# Patient Record
Sex: Female | Born: 1970 | Race: Black or African American | Hispanic: No | Marital: Single | Smoking: Never smoker
Health system: Southern US, Community
[De-identification: ages and names within clinical notes are randomized; demographics above are authoritative.]

## PROBLEM LIST (undated history)

## (undated) DIAGNOSIS — R7303 Prediabetes: Secondary | ICD-10-CM

## (undated) DIAGNOSIS — M1990 Unspecified osteoarthritis, unspecified site: Secondary | ICD-10-CM

## (undated) DIAGNOSIS — I10 Essential (primary) hypertension: Secondary | ICD-10-CM

## (undated) DIAGNOSIS — R519 Headache, unspecified: Secondary | ICD-10-CM

## (undated) DIAGNOSIS — F419 Anxiety disorder, unspecified: Secondary | ICD-10-CM

## (undated) DIAGNOSIS — E669 Obesity, unspecified: Secondary | ICD-10-CM

## (undated) HISTORY — PX: KNEE SURGERY: SHX244

## (undated) HISTORY — PX: SHOULDER SURGERY: SHX246

---

## 1994-12-07 HISTORY — PX: SHOULDER SURGERY: SHX246

## 1999-05-16 ENCOUNTER — Emergency Department (HOSPITAL_COMMUNITY): Admission: EM | Admit: 1999-05-16 | Discharge: 1999-05-16 | Payer: Self-pay | Admitting: Emergency Medicine

## 1999-07-29 ENCOUNTER — Other Ambulatory Visit: Admission: RE | Admit: 1999-07-29 | Discharge: 1999-07-29 | Payer: Self-pay | Admitting: Obstetrics & Gynecology

## 2000-08-11 ENCOUNTER — Other Ambulatory Visit: Admission: RE | Admit: 2000-08-11 | Discharge: 2000-08-11 | Payer: Self-pay | Admitting: Obstetrics & Gynecology

## 2000-11-09 ENCOUNTER — Emergency Department (HOSPITAL_COMMUNITY): Admission: EM | Admit: 2000-11-09 | Discharge: 2000-11-10 | Payer: Self-pay | Admitting: Emergency Medicine

## 2001-06-03 ENCOUNTER — Emergency Department (HOSPITAL_COMMUNITY): Admission: EM | Admit: 2001-06-03 | Discharge: 2001-06-04 | Payer: Self-pay | Admitting: Emergency Medicine

## 2001-06-03 ENCOUNTER — Encounter: Payer: Self-pay | Admitting: Emergency Medicine

## 2001-06-10 ENCOUNTER — Emergency Department (HOSPITAL_COMMUNITY): Admission: EM | Admit: 2001-06-10 | Discharge: 2001-06-10 | Payer: Self-pay | Admitting: Emergency Medicine

## 2001-08-16 ENCOUNTER — Other Ambulatory Visit: Admission: RE | Admit: 2001-08-16 | Discharge: 2001-08-16 | Payer: Self-pay | Admitting: Obstetrics and Gynecology

## 2001-11-14 ENCOUNTER — Encounter: Payer: Self-pay | Admitting: Obstetrics and Gynecology

## 2001-11-14 ENCOUNTER — Ambulatory Visit (HOSPITAL_COMMUNITY): Admission: RE | Admit: 2001-11-14 | Discharge: 2001-11-14 | Payer: Self-pay | Admitting: Obstetrics and Gynecology

## 2001-12-29 ENCOUNTER — Inpatient Hospital Stay (HOSPITAL_COMMUNITY): Admission: AD | Admit: 2001-12-29 | Discharge: 2001-12-29 | Payer: Self-pay | Admitting: Obstetrics and Gynecology

## 2002-01-14 ENCOUNTER — Inpatient Hospital Stay (HOSPITAL_COMMUNITY): Admission: AD | Admit: 2002-01-14 | Discharge: 2002-01-14 | Payer: Self-pay | Admitting: Obstetrics and Gynecology

## 2002-04-08 ENCOUNTER — Encounter (INDEPENDENT_AMBULATORY_CARE_PROVIDER_SITE_OTHER): Payer: Self-pay | Admitting: Specialist

## 2002-04-08 ENCOUNTER — Inpatient Hospital Stay (HOSPITAL_COMMUNITY): Admission: AD | Admit: 2002-04-08 | Discharge: 2002-04-10 | Payer: Self-pay | Admitting: Obstetrics and Gynecology

## 2002-09-05 ENCOUNTER — Other Ambulatory Visit: Admission: RE | Admit: 2002-09-05 | Discharge: 2002-09-05 | Payer: Self-pay | Admitting: Obstetrics and Gynecology

## 2003-08-23 ENCOUNTER — Other Ambulatory Visit: Admission: RE | Admit: 2003-08-23 | Discharge: 2003-08-23 | Payer: Self-pay | Admitting: *Deleted

## 2003-09-18 ENCOUNTER — Encounter: Admission: RE | Admit: 2003-09-18 | Discharge: 2003-12-17 | Payer: Self-pay | Admitting: *Deleted

## 2004-02-29 ENCOUNTER — Emergency Department (HOSPITAL_COMMUNITY): Admission: EM | Admit: 2004-02-29 | Discharge: 2004-02-29 | Payer: Self-pay | Admitting: Family Medicine

## 2004-03-30 ENCOUNTER — Emergency Department (HOSPITAL_COMMUNITY): Admission: EM | Admit: 2004-03-30 | Discharge: 2004-03-30 | Payer: Self-pay | Admitting: Emergency Medicine

## 2004-08-04 ENCOUNTER — Encounter: Admission: RE | Admit: 2004-08-04 | Discharge: 2004-09-05 | Payer: Self-pay | Admitting: Sports Medicine

## 2004-08-25 ENCOUNTER — Other Ambulatory Visit: Admission: RE | Admit: 2004-08-25 | Discharge: 2004-08-25 | Payer: Self-pay | Admitting: Family Medicine

## 2004-09-06 ENCOUNTER — Encounter: Admission: RE | Admit: 2004-09-06 | Discharge: 2004-11-04 | Payer: Self-pay | Admitting: Sports Medicine

## 2010-10-31 ENCOUNTER — Emergency Department (HOSPITAL_COMMUNITY): Admission: EM | Admit: 2010-10-31 | Discharge: 2010-10-31 | Payer: Self-pay | Admitting: Emergency Medicine

## 2011-02-17 LAB — DIFFERENTIAL
Basophils Absolute: 0 10*3/uL (ref 0.0–0.1)
Basophils Relative: 0 % (ref 0–1)
Eosinophils Absolute: 0.1 10*3/uL (ref 0.0–0.7)
Eosinophils Relative: 2 % (ref 0–5)
Lymphocytes Relative: 35 % (ref 12–46)
Lymphs Abs: 2.2 10*3/uL (ref 0.7–4.0)
Monocytes Absolute: 0.6 10*3/uL (ref 0.1–1.0)
Monocytes Relative: 9 % (ref 3–12)
Neutro Abs: 3.4 10*3/uL (ref 1.7–7.7)
Neutrophils Relative %: 53 % (ref 43–77)

## 2011-02-17 LAB — CBC
HCT: 43.8 % (ref 36.0–46.0)
Hemoglobin: 14.4 g/dL (ref 12.0–15.0)
MCH: 28.6 pg (ref 26.0–34.0)
MCHC: 32.9 g/dL (ref 30.0–36.0)
MCV: 87.1 fL (ref 78.0–100.0)
Platelets: 373 10*3/uL (ref 150–400)
RBC: 5.03 MIL/uL (ref 3.87–5.11)
RDW: 13.9 % (ref 11.5–15.5)
WBC: 6.4 10*3/uL (ref 4.0–10.5)

## 2011-02-17 LAB — POCT I-STAT, CHEM 8
BUN: 11 mg/dL (ref 6–23)
Calcium, Ion: 1.16 mmol/L (ref 1.12–1.32)
Chloride: 103 mEq/L (ref 96–112)
Creatinine, Ser: 0.9 mg/dL (ref 0.4–1.2)
Glucose, Bld: 96 mg/dL (ref 70–99)
HCT: 46 % (ref 36.0–46.0)
Hemoglobin: 15.6 g/dL — ABNORMAL HIGH (ref 12.0–15.0)
Potassium: 4.1 mEq/L (ref 3.5–5.1)
Sodium: 140 mEq/L (ref 135–145)
TCO2: 29 mmol/L (ref 0–100)
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
## 2011-11-16 ENCOUNTER — Emergency Department (HOSPITAL_COMMUNITY)
Admission: EM | Admit: 2011-11-16 | Discharge: 2011-11-16 | Disposition: A | Discharge status: Home / Self Care | Payer: Self-pay | Source: Home / Self Care

## 2011-11-16 ENCOUNTER — Emergency Department (HOSPITAL_COMMUNITY): Payer: Self-pay

## 2011-11-16 ENCOUNTER — Encounter: Payer: Self-pay | Admitting: Emergency Medicine

## 2011-11-16 ENCOUNTER — Emergency Department (HOSPITAL_COMMUNITY)
Admission: EM | Admit: 2011-11-16 | Discharge: 2011-11-16 | Disposition: A | Discharge status: Home / Self Care | Payer: Self-pay | Attending: Emergency Medicine | Admitting: Emergency Medicine

## 2011-11-16 DIAGNOSIS — R059 Cough, unspecified: Secondary | ICD-10-CM | POA: Insufficient documentation

## 2011-11-16 DIAGNOSIS — R05 Cough: Secondary | ICD-10-CM | POA: Insufficient documentation

## 2011-11-16 DIAGNOSIS — IMO0002 Reserved for concepts with insufficient information to code with codable children: Secondary | ICD-10-CM | POA: Insufficient documentation

## 2011-11-16 DIAGNOSIS — M7989 Other specified soft tissue disorders: Secondary | ICD-10-CM | POA: Insufficient documentation

## 2011-11-16 DIAGNOSIS — R609 Edema, unspecified: Secondary | ICD-10-CM | POA: Insufficient documentation

## 2011-11-16 DIAGNOSIS — M1710 Unilateral primary osteoarthritis, unspecified knee: Secondary | ICD-10-CM

## 2011-11-16 LAB — CBC
Hemoglobin: 12.2 g/dL (ref 12.0–15.0)
MCH: 28 pg (ref 26.0–34.0)
Platelets: 324 10*3/uL (ref 150–400)
RBC: 4.35 MIL/uL (ref 3.87–5.11)
WBC: 7.6 10*3/uL (ref 4.0–10.5)

## 2011-11-16 LAB — BASIC METABOLIC PANEL
CO2: 27 mEq/L (ref 19–32)
Calcium: 9.5 mg/dL (ref 8.4–10.5)
Chloride: 102 mEq/L (ref 96–112)
Glucose, Bld: 100 mg/dL — ABNORMAL HIGH (ref 70–99)
Potassium: 4.1 mEq/L (ref 3.5–5.1)
Sodium: 137 mEq/L (ref 135–145)

## 2011-11-16 MED ORDER — OXYCODONE-ACETAMINOPHEN 5-325 MG PO TABS
2.0000 | ORAL_TABLET | Freq: Once | ORAL | Status: AC
  Administered 2011-11-16: 2 via ORAL
  Filled 2011-11-16: qty 2

## 2011-11-16 MED ORDER — DICLOFENAC SODIUM 75 MG PO TBEC: 75.0000 mg | DELAYED_RELEASE_TABLET | Freq: Two times a day (BID) | ORAL | Status: DC

## 2011-11-16 NOTE — ED Notes (Signed)
Pt left [facility] and has now arrived at [facility]. Pt did not sign our at [facility].

## 2011-11-16 NOTE — ED Notes (Signed)
Pt c/o L leg swelling and ankle and R ankle swelling and  Headache x 2 weeks. Pt states has propped up legs and no relief.

## 2011-11-16 NOTE — ED Provider Notes (Signed)
History     CSN: [id number] Arrival date & time: 11/16/2011  1:12 PM   First MD Initiated Contact with Patient 11/16/11 1455      Chief Complaint  Patient presents with  . Leg Swelling    (Consider location/radiation/quality/duration/timing/severity/associated sxs/prior treatment) The history is provided by the patient.    Ptp resents to the ED with complaints of left knee swelling and pain and ankle pain. Also, complaints of headaches for two weeks, bilateral temple. Pt has gained a significant amount of weight recently, and has been trying to cut back on her salt intake. She denies recent illness, fevers, chills, SOB, CP, abdominal pain and urinary symptoms. She denies hx of diabetes. 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  History reviewed. No pertinent past medical history.  Past Surgical History  Procedure Date  . Cesarean section   . Shoulder surgery     History reviewed. No pertinent family history.  History  Substance Use Topics  . Smoking status: Not on file  . Smokeless tobacco: Not on file  . Alcohol Use: No    OB History    Grav Para Term Preterm Abortions TAB SAB Ect Mult Living                  Review of Systems  All other systems reviewed and are negative.    Allergies  Codeine  Home Medications  No current outpatient prescriptions on file.  BP 132/75  Pulse 89  Temp(Src) 98.4 F (36.9 C) (Oral)  Resp 16  SpO2 98%  LMP 10/27/2011  Physical Exam  Nursing note and vitals reviewed. Constitutional: She appears well-developed and well-nourished.       Pt is morbidly obese.  HENT:  Head: Normocephalic and atraumatic.  Eyes: Conjunctivae are normal. Pupils are equal, round, and reactive to light.  Neck: Trachea normal, normal range of motion and full passive range of motion without pain. Neck supple.  Cardiovascular: Normal rate, regular rhythm and normal pulses.   Pulmonary/Chest: Effort normal and  breath sounds normal. Chest wall is not dull to percussion. She exhibits no tenderness, no crepitus, no edema, no deformity and no retraction.  Abdominal: Normal appearance.  Musculoskeletal: Normal range of motion. She exhibits edema (mild swelling to bilateral lower extremities. ).       Legs: Neurological: She is alert. She has normal strength.  Skin: Skin is warm, dry and intact.  Psychiatric: She has a normal mood and affect. Her speech is normal and behavior is normal. Judgment and thought content normal. Cognition and memory are normal.    ED Course  Procedures (including critical care time)  Labs Reviewed  BASIC METABOLIC PANEL - Abnormal; Notable for the following:    Glucose, Bld 100 (*)    GFR calc non Af Amer 85 (*)    All other components within normal limits  CBC   Dg Chest 2 View  11/16/2011  *RADIOLOGY REPORT*  Clinical Data: Bilateral lower extremity edema, cough  CHEST - 2 VIEW  Comparison: None.  Findings: Cardiomediastinal silhouette is unremarkable.  No acute infiltrate or pleural effusion.  No pulmonary edema.  Bony thorax is unremarkable.  IMPRESSION: No active disease.  Original Report Authenticated By: Natasha Mead, M.D.   Dg Knee Complete 4 Views Left  11/16/2011  *RADIOLOGY REPORT*  Clinical Data: Knee pain and swelling.  LEFT KNEE - COMPLETE 4+ VIEW  Comparison: None  Findings: There are age  advanced degenerative changes with joint space narrowing and osteophytic spurring most notable in the medial compartment.  No definite acute fracture or osteochondral lesion. A suprapatellar knee joint effusion is noted.  IMPRESSION:  1.  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 2.  No definite acute fracture. 3.  Joint effusion.  Original Report Authenticated By: P. Loralie Champagne, M.D.     No diagnosis found.    MDM  pts labs show unlikely CHF causing lower extremity swelling. Kidney function appears to be adaquate and pt is making urine. Knee xrays show a  nonspecific joint effusion with some age advanced osteoarthritis.        Dorthula Matas, PA 11/16/11 [phone]

## 2011-11-16 NOTE — ED Notes (Signed)
Patient transported to X-ray 

## 2011-11-17 NOTE — ED Provider Notes (Signed)
Medical screening examination/treatment/procedure(s) were performed by non-physician practitioner and as supervising physician I was immediately available for consultation/collaboration.   Gerhard Munch, MD 11/17/11 1213

## 2012-03-06 ENCOUNTER — Encounter (HOSPITAL_COMMUNITY): Payer: Self-pay | Admitting: *Deleted

## 2012-03-06 ENCOUNTER — Emergency Department (INDEPENDENT_AMBULATORY_CARE_PROVIDER_SITE_OTHER)
Admission: EM | Admit: 2012-03-06 | Discharge: 2012-03-06 | Disposition: A | Discharge status: Home / Self Care | Payer: Self-pay | Source: Home / Self Care

## 2012-03-06 DIAGNOSIS — B86 Scabies: Secondary | ICD-10-CM

## 2012-03-06 MED ORDER — PERMETHRIN 5 % EX CREA: TOPICAL_CREAM | Freq: Once | CUTANEOUS | Status: AC

## 2012-03-06 NOTE — ED Provider Notes (Signed)
Medical screening examination/treatment/procedure(s) were performed by PGY-3 FM resident and as supervising physician I was immediately available for consultation/collaboration.   Sharin Grave, MD   Sharin Grave, MD 03/06/12 1478

## 2012-03-06 NOTE — ED Notes (Signed)
Pt exposed to scabies - onset of itching x 2 weeks

## 2012-03-06 NOTE — ED Provider Notes (Signed)
Morgan Cline is a 41 y.o. female who presents to Urgent Care today for scabies exposure and itching.  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  She is anxious that she may have contracted scabies and is itchy.  She notes several itchy spots on her back and arm and in her groin.  She is not taking any medications for this yet. Otherwise she feels well that any pain fever chills trouble breathing.   PMH reviewed. Significant for obesity otherwise well ROS as above otherwise neg Medications reviewed. No current facility-administered medications for this encounter.   Current Outpatient Prescriptions  Medication Sig Dispense Refill  . diclofenac (VOLTAREN) 75 MG EC tablet Take 1 tablet (75 mg total) by mouth 2 (two) times daily.  30 tablet  0  . permethrin (ELIMITE) 5 % cream Apply topically once.  60 g  0    Exam:  BP 108/79  Pulse 123  Temp(Src) 97.5 F (36.4 C) (Oral)  Resp 20  SpO2 100%  LMP 02/19/2012 Gen: Well NAD, obese Skin: Small excoriated spots on arm back and in groin some occasional papules with erythematous base.  No significant interdigital web space involvement.  Assessment and Plan: 41 year old woman with possible scabies.  She has certainly been exposed and it does make sense to attempt treatment with permethrin cream.  Additionally recommended hydrocortisone cream and Benadryl as needed for symptoms.  I advised that it may be several weeks before she stops itching.  Discussed warning signs or symptoms. Additionally I provided a handout. She expresses understanding.     Rodolph Bong, MD 03/06/12 1020

## 2012-03-06 NOTE — Discharge Instructions (Signed)
Thank you for coming in today. Apply the permethrin from the neck down overnight and wash off in the morning. Use hydrocortisone cream and Benadryl as needed for itching. It may take a few weeks to stop itching. Followup with your doctor if you do not get better in a few weeks. Call or go to the emergency room if you get worse, have trouble breathing, have chest pains, or palpitations.   Scabies Scabies are small bugs (mites) that burrow under the skin and cause red bumps and severe itching. These bugs can only be seen with a microscope. Scabies are highly contagious. They can spread easily from person to person by direct contact. They are also spread through sharing clothing or linens that have the scabies mites living in them. It is not unusual for an entire family to become infected through shared towels, clothing, or bedding.  HOME CARE INSTRUCTIONS   Your caregiver may prescribe a cream or lotion to kill the mites. If this cream is prescribed; massage the cream into the entire area of the body from the neck to the bottom of both feet. Also massage the cream into the scalp and face if your child is less than 16 year old. Avoid the eyes and mouth.   Leave the cream on for 8 to12 hours. Do not wash your hands after application. Your child should bathe or shower after the 8 to 12 hour application period. Sometimes it is helpful to apply the cream to your child at right before bedtime.   One treatment is usually effective and will eliminate approximately 95% of infestations. For severe cases, your caregiver may decide to repeat the treatment in 1 week. Everyone in your household should be treated with one application of the cream.   New rashes or burrows should not appear after successful treatment within 24 to 48 hours; however the itching and rash may last for 2 to 4 weeks after successful treatment. If your symptoms persist longer than this, see your caregiver.   Your caregiver also may  prescribe a medication to help with the itching or to help the rash go away more quickly.   Scabies can live on clothing or linens for up to 3 days. Your entire child's recently used clothing, towels, stuffed toys, and bed linens should be washed in hot water and then dried in a dryer for at least 20 minutes on high heat. Items that cannot be washed should be enclosed in a plastic bag for at least 3 days.   To help relieve itching, bathe your child in a cool bath or apply cool washcloths to the affected areas.   Your child may return to school after treatment with the prescribed cream.  SEEK MEDICAL CARE IF:   The itching persists longer than 4 weeks after treatment.   The rash spreads or becomes infected (the area has red blisters or yellow-tan crust).  Document Released: 11/23/2005 Document Revised: 11/12/2011 Document Reviewed: 04/03/2009 [facility] Patient Information 2012 [place], [place].

## 2012-03-18 ENCOUNTER — Inpatient Hospital Stay (HOSPITAL_COMMUNITY)
Admission: AD | Admit: 2012-03-18 | Discharge: 2012-03-19 | Disposition: A | Discharge status: Home / Self Care | Payer: Self-pay | Source: Ambulatory Visit | Attending: Obstetrics & Gynecology | Admitting: Obstetrics & Gynecology

## 2012-03-18 ENCOUNTER — Encounter (HOSPITAL_COMMUNITY): Payer: Self-pay | Admitting: Obstetrics and Gynecology

## 2012-03-18 DIAGNOSIS — L03319 Cellulitis of trunk, unspecified: Secondary | ICD-10-CM | POA: Insufficient documentation

## 2012-03-18 DIAGNOSIS — N949 Unspecified condition associated with female genital organs and menstrual cycle: Secondary | ICD-10-CM | POA: Insufficient documentation

## 2012-03-18 DIAGNOSIS — L02219 Cutaneous abscess of trunk, unspecified: Secondary | ICD-10-CM | POA: Insufficient documentation

## 2012-03-18 DIAGNOSIS — L02215 Cutaneous abscess of perineum: Secondary | ICD-10-CM

## 2012-03-18 HISTORY — DX: Obesity, unspecified: E66.9

## 2012-03-18 HISTORY — DX: Unspecified osteoarthritis, unspecified site: M19.90

## 2012-03-18 MED ORDER — HYDROCODONE-ACETAMINOPHEN 5-500 MG PO TABS: 1.0000 | ORAL_TABLET | Freq: Four times a day (QID) | ORAL | Status: AC | PRN

## 2012-03-18 MED ORDER — CEPHALEXIN 500 MG PO CAPS: 500.0000 mg | ORAL_CAPSULE | Freq: Four times a day (QID) | ORAL | Status: AC

## 2012-03-18 NOTE — MAU Note (Signed)
"  I was trying to go to the BR too fast this past Saturday 03/12/12 and I hit the side of the toilet.  I put ice on it and it seemed to have gotten worse.  It started out about the size of the tip of my pinky finger, but now it is bigger about the size of a half dollar."

## 2012-03-18 NOTE — MAU Note (Signed)
Pt states, " last week I was trying to get to the bathroom quick and I hit the edge of the tolite seat. I area that I hit has gotten larger and larger. It hurts to walk or sit."

## 2012-03-18 NOTE — MAU Provider Note (Signed)
History   Pt presents today c/o pain in her vagina. She states she hit her vagina on the toilet seat last Saturday and since that time, she has developed swelling in the area and it has now become very painful. She states she has difficult even sitting. She denies fever, vag irritation, or any other sx at this time.  CSN: [id number]  Arrival date and time: 03/18/12 2222   First Provider Initiated Contact with Patient 03/18/12 2258      Chief Complaint  Patient presents with  . Vaginal Pain   HPI  OB History    Grav Para Term Preterm Abortions TAB SAB Ect Mult Living   2 2 0 2      1      Past Medical History  Diagnosis Date  . Arthritis   . Obesity     Past Surgical History  Procedure Date  . Cesarean section   . Shoulder surgery     History reviewed. No pertinent family history.  History  Substance Use Topics  . Smoking status: Never Smoker   . Smokeless tobacco: Not on file  . Alcohol Use: No    Allergies:  Allergies  Allergen Reactions  . Codeine Hives and Itching    Prescriptions prior to admission  Medication Sig Dispense Refill  . acetaminophen (TYLENOL) 500 MG tablet Take 500 mg by mouth every 6 (six) hours as needed. As needed for pain        Review of Systems  Constitutional: Negative for fever and chills.  Eyes: Negative for blurred vision and double vision.  Respiratory: Negative for cough, hemoptysis, sputum production, shortness of breath and wheezing.   Cardiovascular: Negative for chest pain and palpitations.  Gastrointestinal: Negative for nausea, vomiting, abdominal pain, diarrhea and constipation.  Genitourinary: Negative for dysuria, urgency, frequency and hematuria.  Neurological: Negative for dizziness and headaches.  Psychiatric/Behavioral: Negative for depression and suicidal ideas.   Physical Exam   Blood pressure 118/81, pulse 100, temperature 98.8 F (37.1 C), temperature source Oral, resp. rate 18, height 5' 3.5" (1.613 m),  weight 280 lb 8 oz (127.234 kg), last menstrual period 02/19/2012.  Physical Exam  Nursing note and vitals reviewed. Constitutional: She is oriented to person, place, and time. She appears well-developed and well-nourished. No distress.  HENT:  Head: Normocephalic and atraumatic.  GI: Soft. She exhibits no distension and no mass. There is no tenderness. There is no rebound and no guarding.  Genitourinary:     Neurological: She is alert and oriented to person, place, and time.  Skin: Skin is warm and dry. She is not diaphoretic.  Psychiatric: She has a normal mood and affect. Her behavior is normal. Judgment and thought content normal.    MAU Course  INCISION AND DRAINAGE Date/Time: 03/18/2012 11:41 PM Performed by: Harmon Pier Authorized by: Jaynie Collins A Consent: Verbal consent obtained. Written consent obtained. Risks and benefits: risks, benefits and alternatives were discussed Consent given by: patient Patient understanding: patient states understanding of the procedure being performed Patient consent: the patient's understanding of the procedure matches consent given Patient identity confirmed: verbally with patient and arm band Time out: Immediately prior to procedure a "time out" was called to verify the correct patient, procedure, equipment, support staff and site/side marked as required. Type: abscess Body area: anogenital Location details: perineum Anesthesia: local infiltration Local anesthetic: lidocaine 1% without epinephrine Anesthetic total: 3 ml Patient sedated: no Scalpel size: 11 Needle gauge: 22 Incision type: single straight  Complexity: simple Drainage: purulent Drainage amount: moderate Wound treatment: wound left open Packing material: none Patient tolerance: Patient tolerated the procedure well with no immediate complications. Comments: Pt had immediate relief of symptoms with drainage.      Assessment and Plan  Perineal abscess: I&D  successful. Will give pt Rx for keflex and vicodin. Pt states she has used vicodin in the past without itching or other sx. She will f/u with her PCP. Discussed diet, activity, risks, and precautions. Discussed wound care instructions and precautions.  Clinton Gallant. Garin Mata III, DrHSc, MPAS, PA-C  03/18/2012, 11:36 PM

## 2012-03-18 NOTE — Discharge Instructions (Signed)
Perineal Abscess An abscess is a sac filled infection. The perineum is the area between the anus and the scrotum in the female and between the anus and the vulva (the labial opening to the vagina) in the female. The size of the abscess varies. If it remains open and begins draining, it can form a fistula. A fistula is a tract that drains the abscess to the surface. The peak incidence of this problem is in the third to fourth decades of life. Men are affected more frequently than women.  CAUSES  There are numerous causes for this type of infection. In women  The spread of cancer from pelvic organs such as the uterus and ovaries or the spread of infection from special glands located near the vagina.   Problems that occur at the time or soon after delivering a baby:   Infected lochia (the fluid that weeps from the vagina for a week or so after delivery of a baby).   Stool contaminating an episiotomy (a surgical procedure for widening the outlet of the birth canal to facilitate delivery of the baby and to avoid a jagged rip of the perineum).   Poor hygiene could all contribute to forming an abscess of the perineum.  In men  Infection of the prostate gland or the spread of cancer from the prostate.  In men and women  Complication of surgery to the rectum.   Diseases of the colon (such as Crohn's disease).   The spread of cancer from the rectum.   It is also more common when the immune system is depressed:   Patients receiving chemotherapy.   Patients with AIDS.  SYMPTOMS  Pain, swelling, and redness in the area of the abscess are the most frequent symptoms. The redness may go beyond the abscess and appear as a red streak on the skin. There is often a visible lump or a lump that can be felt when touching the area. Bleeding, pus-like discharge, fevers, and general weakness may also be present.  DIAGNOSIS  An examination by your caregiver will establish this ailment. A digital rectal exam,  and in women, a careful vaginal exam, is sometimes necessary. Sometimes looking into your rectum with a special instrument is needed.  TREATMENT  Incision and drainage is one common treatment of an abscess. This can sometimes be done in an office, emergency department, or surgical center with a local anesthetic. For larger or deeper abscesses, an operation is required to drain the abscess. Antibiotics are sometimes used if there is cellulitis (infection of the surrounding tissue). Antibiotics are medications that kill bacteria germs. Antibiotics may also be used if there are immune problems or heart valve problems.  Packing of the wound is sometimes done to produce good drainage. Frequent sitz baths may be recommended to help the wound heal in from the base and sides so that the risk of continued infection and recurrence is reduced. HOME CARE INSTRUCTIONS   If a gauze pack is used in the abscess, it can usually be removed in 2 to 3 days or as directed.   If one or more drains have been placed in the abscess cavity, be careful not to pull at them. Your caregiver will tell you how long they need to remain in place.   Use warm sitz baths 3 to 4 times per day and after bowel movements.   Keep the skin around the abscess clean and dry. Avoid cleaning the area too much or scratching.   Avoid using   colored or perfumed toilet papers.   Use pain medication, antibiotics, or other medications that your caregiver tells you to use.  SEEK MEDICAL CARE IF:   You have problems having a bowel movement or passing your urine.   The gauze packing or the drain(s) come out before the planned time.   Swelling or pain in the affected area does not seem to be improving as expected.   You feel you have side effects from prescribed medication.  SEEK IMMEDIATE MEDICAL CARE IF:   You develop problems moving or using your legs.   You develop severe or increasing pain.   Swelling of the affected area suddenly gets  worse.   You have increased bleeding or passing of pus.   You develop chills or fever above 101 F (38.3 C).  MAKE SURE YOU:   Understand these instructions.   Will watch your condition.   Will get help right away if you are not doing well or get worse.  Document Released: 12/30/2006 Document Revised: 11/12/2011 Document Reviewed: 01/30/2008 [employer] Patient Information 2012 [employer]. 

## 2012-03-22 NOTE — MAU Provider Note (Signed)
Attestation of Attending Supervision of Advanced Practitioner: Evaluation and management procedures were performed by the [facility] Fellow/PA/CNM/NP under my supervision and collaboration. Chart reviewed, and agree with management and plan.  Jaynie Collins, M.D. 03/22/2012 11:36 AM

## 2013-06-30 DIAGNOSIS — N939 Abnormal uterine and vaginal bleeding, unspecified: Secondary | ICD-10-CM | POA: Insufficient documentation

## 2014-10-08 ENCOUNTER — Encounter (HOSPITAL_COMMUNITY): Payer: Self-pay | Admitting: Obstetrics and Gynecology

## 2014-11-28 ENCOUNTER — Encounter (HOSPITAL_BASED_OUTPATIENT_CLINIC_OR_DEPARTMENT_OTHER): Payer: Self-pay | Admitting: Emergency Medicine

## 2014-11-28 ENCOUNTER — Emergency Department (HOSPITAL_BASED_OUTPATIENT_CLINIC_OR_DEPARTMENT_OTHER)
Admission: EM | Admit: 2014-11-28 | Discharge: 2014-11-28 | Disposition: A | Discharge status: Home / Self Care | Payer: BC Managed Care – PPO | Attending: Emergency Medicine | Admitting: Emergency Medicine

## 2014-11-28 DIAGNOSIS — R197 Diarrhea, unspecified: Secondary | ICD-10-CM | POA: Diagnosis not present

## 2014-11-28 DIAGNOSIS — R109 Unspecified abdominal pain: Secondary | ICD-10-CM | POA: Insufficient documentation

## 2014-11-28 DIAGNOSIS — Z3202 Encounter for pregnancy test, result negative: Secondary | ICD-10-CM | POA: Diagnosis not present

## 2014-11-28 DIAGNOSIS — E669 Obesity, unspecified: Secondary | ICD-10-CM | POA: Diagnosis not present

## 2014-11-28 DIAGNOSIS — M1990 Unspecified osteoarthritis, unspecified site: Secondary | ICD-10-CM | POA: Diagnosis not present

## 2014-11-28 LAB — PREGNANCY, URINE: Preg Test, Ur: NEGATIVE

## 2014-11-28 LAB — URINALYSIS, ROUTINE W REFLEX MICROSCOPIC
BILIRUBIN URINE: NEGATIVE
Glucose, UA: NEGATIVE mg/dL
Hgb urine dipstick: NEGATIVE
KETONES UR: NEGATIVE mg/dL
Leukocytes, UA: NEGATIVE
NITRITE: NEGATIVE
PH: 7 (ref 5.0–8.0)
Protein, ur: NEGATIVE mg/dL
Specific Gravity, Urine: 1.018 (ref 1.005–1.030)
Urobilinogen, UA: 0.2 mg/dL (ref 0.0–1.0)

## 2014-11-28 MED ORDER — DICYCLOMINE HCL 20 MG PO TABS: 20.0000 mg | ORAL_TABLET | Freq: Two times a day (BID) | ORAL | Status: DC

## 2014-11-28 MED ORDER — ONDANSETRON HCL 4 MG PO TABS: 4.0000 mg | ORAL_TABLET | Freq: Four times a day (QID) | ORAL | Status: DC

## 2014-11-28 MED ORDER — DICYCLOMINE HCL 10 MG PO CAPS
10.0000 mg | ORAL_CAPSULE | Freq: Once | ORAL | Status: AC
  Administered 2014-11-28: 10 mg via ORAL
  Filled 2014-11-28: qty 1

## 2014-11-28 NOTE — Discharge Instructions (Signed)
Food Poisoning °Food poisoning is an illness caused by something you ate or drank. There are over 250 known causes of food poisoning. However, many other causes are unknown. You can be treated even if the exact cause of your food poisoning is not known. In most cases, food poisoning is mild and lasts 1 to 2 days. However, some cases can be serious, especially for people with low immune systems, the elderly, children and infants, and pregnant women. °CAUSES  °Poor personal hygiene, improper cleaning of storage and preparation areas, and unclean utensils can cause infection or tainting (contamination) of foods. The causes of food poisoning are numerous. Infectious agents, such as viruses, bacteria, or parasites, can cause harm by infecting the intestine and disrupting the absorption of nutrients and water. This can cause diarrhea and lead to dehydration. Viruses are responsible for most of the food poisonings in which an agent is found. Parasites are less likely to cause food poisoning. Toxic agents, such as poisonous mushrooms, marine algae, and pesticides can also cause food poisoning. °· Viral causes of food poisoning include: °¨ Norovirus. °¨ Rotavirus. °¨ Hepatitis A. °· Bacterial causes of food poisoning include: °¨ Salmonellae. °¨ Campylobacter. °¨ Bacillus cereus. °¨ Escherichia coli (E. coli). °¨ Shigella. °¨ Listeria monocytogenes. °¨ Clostridium botulinum (botulism). °¨ Vibrio cholerae. °· Parasites that can cause food poisoning include: °¨ Giardia. °¨ Cryptosporidium. °¨ Toxoplasma. °SYMPTOMS °Symptoms may appear several hours or longer after consuming the contaminated food or drink. Symptoms may include: °· Nausea. °· Vomiting. °· Cramping. °· Diarrhea. °· Fever and chills. °· Muscle aches. °DIAGNOSIS °Your health care provider may be able to diagnose food poisoning from a list of what you have recently eaten and results from lab tests. Diagnostic tests may include an exam of the feces. °TREATMENT °In  most cases, treatment focuses on helping to relieve your symptoms and staying well hydrated. Antibiotic medicines are rarely needed. In severe cases, hospitalization may be required. °HOME CARE INSTRUCTIONS  °· Drink enough water and fluids to keep your urine clear or pale yellow. Drink small amounts of fluids frequently and increase as tolerated. °· Ask your health care provider for specific rehydration instructions. °· Avoid: °¨ Foods high in sugar. °¨ Alcohol. °¨ Carbonated drinks. °¨ Tobacco. °¨ Juice. °¨ Caffeine drinks. °¨ Extremely hot or cold fluids. °¨ Fatty, greasy foods. °¨ Too much intake of anything at one time. °¨ Dairy products until 24 to 48 hours after diarrhea stops. °· You may consume probiotics. Probiotics are active cultures of beneficial bacteria. They may lessen the amount and number of diarrheal stools in adults. Probiotics can be found in yogurt with active cultures and in supplements. °· Wash your hands well to avoid spreading the bacteria. °· Take medicines only as directed by your health care provider. Do not give your child aspirin because of the association with Reye's syndrome. °· Ask your health care provider if you should continue to take your regular prescribed and over-the-counter medicines. °PREVENTION  °· Wash your hands, food preparation surfaces, and utensils thoroughly before and after handling raw foods. °· Keep refrigerated foods below 40°F (5°C). °· Serve hot foods immediately or keep them heated above 140°F (60°C). °· Divide large volumes of food into small portions for rapid cooling in the refrigerator. Hot, bulky foods in the refrigerator can raise the temperature of other foods that have already cooled. °· Follow approved canning procedures. °· Heat canned foods thoroughly before tasting. °· When in doubt, throw it out. °· Infants, the elderly, women   who are pregnant, and people with compromised immune systems are especially susceptible to food poisoning. These people  should never consume unpasteurized cheese, unpasteurized cider, raw fish, raw seafood, or raw meat-type products. °SEEK IMMEDIATE MEDICAL CARE IF:  °· You have difficulty breathing, swallowing, talking, or moving. °· You develop blurred vision. °· You are unable to keep fluids down. °· You faint or nearly faint. °· Your eyes turn yellow. °· Vomiting or diarrhea develops or becomes persistent. °· Abdominal pain develops, increases, or localizes in one small area. °· You have a fever. °· The diarrhea becomes excessive or contains blood or mucus. °· You develop excessive weakness, dizziness, or extreme thirst. °· You have no urine for 8 hours. °MAKE SURE YOU:  °· Understand these instructions. °· Will watch your condition. °· Will get help right away if you are not doing well or get worse. °Document Released: 08/21/2004 Document Revised: 04/09/2014 Document Reviewed: 04/09/2011 °[employer]® Patient Information ©2015 [employer]. This information is not intended to replace advice given to you by your health care provider. Make sure you discuss any questions you have with your health care provider. ° °

## 2014-11-28 NOTE — ED Provider Notes (Signed)
CSN: [id number]     Arrival date & time 11/28/14  0714 History   First MD Initiated Contact with Patient 11/28/14 0757     Chief Complaint  Patient presents with  . Abdominal Pain     (Consider location/radiation/quality/duration/timing/severity/associated sxs/prior Treatment) HPI Comments: Patient reports eating chicken wings and pizza last night in about 2 hours later began having low intermittent abdominal cramping, and has had about 6 episodes of loose stool that has been nonbloody.  She has had chills but no fevers.  No nausea, no vomiting, no dysuria.  She has no pain currently  Patient is a 43 y.o. female presenting with abdominal pain.  Abdominal Pain Associated symptoms: diarrhea   Associated symptoms: no chest pain, no chills, no cough, no dysuria, no fatigue, no fever, no nausea, no shortness of breath, no sore throat and no vomiting     Past Medical History  Diagnosis Date  . Arthritis   . Obesity    Past Surgical History  Procedure Laterality Date  . Cesarean section    . Shoulder surgery     History reviewed. No pertinent family history. History  Substance Use Topics  . Smoking status: Never Smoker   . Smokeless tobacco: Not on file  . Alcohol Use: No   OB History    Gravida Para Term Preterm AB TAB SAB Ectopic Multiple Living   2 2 0 2      1     Review of Systems  Constitutional: Negative for fever, chills, diaphoresis, activity change, appetite change and fatigue.  HENT: Negative for congestion, facial swelling, rhinorrhea and sore throat.   Eyes: Negative for photophobia and discharge.  Respiratory: Negative for cough, chest tightness and shortness of breath.   Cardiovascular: Negative for chest pain, palpitations and leg swelling.  Gastrointestinal: Positive for abdominal pain and diarrhea. Negative for nausea and vomiting.  Endocrine: Negative for polydipsia and polyuria.  Genitourinary: Negative for dysuria, frequency, difficulty urinating and  pelvic pain.  Musculoskeletal: Negative for back pain, arthralgias, neck pain and neck stiffness.  Skin: Negative for color change and wound.  Allergic/Immunologic: Negative for immunocompromised state.  Neurological: Negative for facial asymmetry, weakness, numbness and headaches.  Hematological: Does not bruise/bleed easily.  Psychiatric/Behavioral: Negative for confusion and agitation.      Allergies  Codeine  Home Medications   Prior to Admission medications   Medication Sig Start Date End Date Taking? Authorizing Provider  acetaminophen (TYLENOL) 500 MG tablet Take 500 mg by mouth every 6 (six) hours as needed. As needed for pain    Historical Provider, MD  dicyclomine (BENTYL) 20 MG tablet Take 1 tablet (20 mg total) by mouth 2 (two) times daily. 11/28/14   Ernestina Patches, MD  ondansetron (ZOFRAN) 4 MG tablet Take 1 tablet (4 mg total) by mouth every 6 (six) hours. 11/28/14   Ernestina Patches, MD   BP 108/79 mmHg  Pulse 74  Temp(Src) 98.3 F (36.8 C) (Oral)  Resp 16  Wt 280 lb (127.007 kg)  SpO2 99%  LMP 10/29/2014 (Approximate) Physical Exam  Constitutional: She is oriented to person, place, and time. She appears well-developed and well-nourished. No distress.  Obese  HENT:  Head: Normocephalic and atraumatic.  Mouth/Throat: No oropharyngeal exudate.  Eyes: Pupils are equal, round, and reactive to light.  Neck: Normal range of motion. Neck supple.  Cardiovascular: Normal rate, regular rhythm and normal heart sounds.  Exam reveals no gallop and no friction rub.   No murmur heard. Pulmonary/Chest:  Effort normal and breath sounds normal. No respiratory distress. She has no wheezes. She has no rales.  Abdominal: Soft. Bowel sounds are normal. She exhibits no distension and no mass. There is no tenderness. There is no rebound and no guarding.  Musculoskeletal: Normal range of motion. She exhibits no edema or tenderness.  Neurological: She is alert and oriented to person,  place, and time.  Skin: Skin is warm and dry.  Psychiatric: She has a normal mood and affect.    ED Course  Procedures (including critical care time) Labs Review Labs Reviewed  URINALYSIS, ROUTINE W REFLEX MICROSCOPIC  PREGNANCY, URINE    Imaging Review No results found.   EKG Interpretation None      MDM   Final diagnoses:  Abdominal cramping  Diarrhea    Pt is a 43 y.o. female with Pmhx as above who presents with intermittent low abdominal cramping and about 6 episodes of loose stool last night about 2 hours after eating pizza and hot wings.  She's had chills, no known fevers, no nausea or vomiting, no dysuria.  She has no pain currently and has been tolerating Gatorade and ginger ale.  On physical exam vital signs are stable and she is in no acute distress.  She has no abdominal tenderness reproducible on abdominal exam.  I suspect food borne illness.  She is tolerating by mouth, and has normal exam I do not feel lab work is indicated.  I doubt acute surgical cause pain.  Patient will be discharged home with prescription for Bentyl and Zofran     [facility] evaluation in the Emergency Department is complete. It has been determined that no acute conditions requiring further emergency intervention are present at this time. The patient/guardian have been advised of the diagnosis and plan. We have discussed signs and symptoms that warrant return to the ED, such as changes or worsening in symptoms, worsening or persistent pain, fever, inability to tolerate liquids      Ernestina Patches, MD 11/28/14 [phone]

## 2014-11-28 NOTE — ED Notes (Signed)
Patient states that she ate some chichen wings last night and now she is having frequent loose stools over night. This am she is unable to go to the restroom and having abdominal cramping

## 2016-06-30 ENCOUNTER — Ambulatory Visit (INDEPENDENT_AMBULATORY_CARE_PROVIDER_SITE_OTHER): Payer: BLUE CROSS/BLUE SHIELD | Admitting: Obstetrics & Gynecology

## 2016-06-30 ENCOUNTER — Encounter: Payer: Self-pay | Admitting: Obstetrics & Gynecology

## 2016-06-30 VITALS — BP 127/90 | HR 95 | Ht 64.0 in | Wt 276.0 lb

## 2016-06-30 DIAGNOSIS — G43829 Menstrual migraine, not intractable, without status migrainosus: Secondary | ICD-10-CM

## 2016-06-30 DIAGNOSIS — N943 Premenstrual tension syndrome: Secondary | ICD-10-CM | POA: Diagnosis not present

## 2016-06-30 MED ORDER — MISOPROSTOL 200 MCG PO TABS: ORAL_TABLET | ORAL | 0 refills | Status: DC

## 2016-06-30 NOTE — Progress Notes (Signed)
   Subjective:    Patient ID: Morgan Cline, female    DOB: Jun 26, 1971, 45 y.o.   MRN: [id number]  HPI  45 yo S AA P2 LC 1 (22 yo son) here today with the issue of menstrual migraines and feeling like she wants to bite the head off of people just before her periods, since about 2012. She tried some antidepressent (unknown name) but this made it worse.  Review of Systems She uses condoms.Had Mirena for 5 years and didn't have periods. Monogamous for about 2 years    Objective:   Physical Exam [id number] Breathing, conversing, and ambulating normally       Assessment & Plan:  Menstrual migraines and pre menstrual moodiness- Rec restart Mirena She will be starting her period this week and will get it then, pretreat with cytotec.

## 2016-07-02 ENCOUNTER — Ambulatory Visit (INDEPENDENT_AMBULATORY_CARE_PROVIDER_SITE_OTHER): Payer: BLUE CROSS/BLUE SHIELD | Admitting: Obstetrics & Gynecology

## 2016-07-02 ENCOUNTER — Encounter: Payer: Self-pay | Admitting: Obstetrics & Gynecology

## 2016-07-02 VITALS — BP 124/78 | HR 78 | Resp 16 | Ht 62.0 in | Wt 276.0 lb

## 2016-07-02 DIAGNOSIS — Z30430 Encounter for insertion of intrauterine contraceptive device: Secondary | ICD-10-CM

## 2016-07-02 DIAGNOSIS — G43829 Menstrual migraine, not intractable, without status migrainosus: Secondary | ICD-10-CM

## 2016-07-02 DIAGNOSIS — Z01812 Encounter for preprocedural laboratory examination: Secondary | ICD-10-CM | POA: Diagnosis not present

## 2016-07-02 LAB — POCT URINE PREGNANCY: PREG TEST UR: NEGATIVE

## 2016-07-02 MED ORDER — LEVONORGESTREL 20 MCG/24HR IU IUD
INTRAUTERINE_SYSTEM | Freq: Once | INTRAUTERINE | Status: AC
  Administered 2016-07-02: 17:00:00 via INTRAUTERINE

## 2016-07-02 NOTE — Progress Notes (Signed)
   Subjective:    Patient ID: Morgan Cline, female    DOB: May 30, 1971, 45 y.o.   MRN: [id number]  HPI 45 yo S AA LC1 (43 yo son) here for Mirena insertion to help with her menstrual migraines.   Review of Systems     Objective:   Physical Exam Pleasant morbidly obese BFNAD UPT negative, consent signed, Time out procedure done. Cervix prepped with betadine and grasped with a single tooth tenaculum. Mirena was easily placed and the strings were cut to 3-4 cm. Uterus sounded to 9 cm. She tolerated the procedure well.         Assessment & Plan:  Menstrual migraines- trial of Mirena RTC 4 weeks for string check Rec back up for 2 weeks

## 2016-07-28 ENCOUNTER — Ambulatory Visit: Payer: BLUE CROSS/BLUE SHIELD | Admitting: Obstetrics & Gynecology

## 2016-08-11 ENCOUNTER — Encounter: Payer: Self-pay | Admitting: Obstetrics & Gynecology

## 2016-08-11 ENCOUNTER — Ambulatory Visit (INDEPENDENT_AMBULATORY_CARE_PROVIDER_SITE_OTHER): Payer: BLUE CROSS/BLUE SHIELD | Admitting: Obstetrics & Gynecology

## 2016-08-11 DIAGNOSIS — Z30431 Encounter for routine checking of intrauterine contraceptive device: Secondary | ICD-10-CM | POA: Insufficient documentation

## 2016-08-11 NOTE — Patient Instructions (Signed)

## 2016-08-11 NOTE — Progress Notes (Signed)
History:  45 y.o. G2P0201 here today for today for IUD string check; Mirena IUD was placed  07/02/16. Pt reports irreg menses that she hates.  She has not noted a difference in her HA and had a severe one 4 days prev.  She does reports improvement of her moods. She will keep it and f./u if the sx get worse.  She denies concerning side effects.  The following portions of the patient's history were reviewed and updated as appropriate: allergies, current medications, past family history, past medical history, past social history, past surgical history and problem list.   Review of Systems:  Pertinent items are noted in HPI.  Objective:  Physical Exam Blood pressure (!) 126/91, pulse 97, height 5\' 4"  (1.626 m), weight 270 lb (122.5 kg). Gen: NAD Abd: Soft, nontender and nondistended Pelvic: Normal appearing external genitalia; normal appearing vaginal mucosa and cervix.  IUD strings visualized, about 3 cm in length outside cervix.   Assessment & Plan:  Normal IUD check. Patient to keep IUD in place for five years; can come in for removal if she desires Routine preventative health maintenance measures emphasized.   Tamotsu Wiederholt L. Harraway-Smith, M.D., Cherlynn June

## 2016-11-19 ENCOUNTER — Encounter: Payer: Self-pay | Admitting: Obstetrics & Gynecology

## 2016-11-19 ENCOUNTER — Ambulatory Visit (INDEPENDENT_AMBULATORY_CARE_PROVIDER_SITE_OTHER): Payer: BLUE CROSS/BLUE SHIELD | Admitting: Obstetrics & Gynecology

## 2016-11-19 VITALS — BP 126/89 | HR 99 | Ht 65.0 in | Wt 276.0 lb

## 2016-11-19 DIAGNOSIS — N898 Other specified noninflammatory disorders of vagina: Secondary | ICD-10-CM

## 2016-11-19 DIAGNOSIS — R350 Frequency of micturition: Secondary | ICD-10-CM

## 2016-11-19 DIAGNOSIS — E6609 Other obesity due to excess calories: Secondary | ICD-10-CM

## 2016-11-19 DIAGNOSIS — IMO0001 Reserved for inherently not codable concepts without codable children: Secondary | ICD-10-CM

## 2016-11-19 DIAGNOSIS — E669 Obesity, unspecified: Secondary | ICD-10-CM | POA: Insufficient documentation

## 2016-11-19 LAB — POCT URINALYSIS DIPSTICK
BILIRUBIN UA: NEGATIVE
Glucose, UA: NEGATIVE
KETONES UA: NEGATIVE
Leukocytes, UA: NEGATIVE
Nitrite, UA: NEGATIVE
PH UA: 5
PROTEIN UA: NEGATIVE
RBC UA: NEGATIVE
SPEC GRAV UA: 1.025
Urobilinogen, UA: 0.2

## 2016-11-19 NOTE — Progress Notes (Signed)
   Subjective:    Patient ID: Morgan Cline, female    DOB: 08/03/1971, 45 y.o.   MRN: [id number]  HPI  Pt had a UTI and BV.  She was treated by PCP.  While taking the abx she started having brown old blood spotting.  Pt thinks it could have been her menses but was worried b/cit usually red spotitng.  Pt has IUD in plance nad menses have been getting lighter each month.  She skipped a couple of months.  Review of Systems  Respiratory: Negative.   Cardiovascular: Negative.   Genitourinary: Positive for vaginal bleeding.       Objective:   Physical Exam  Constitutional: She is oriented to person, place, and time. She appears well-developed and well-nourished. No distress.  HENT:  Head: Normocephalic and atraumatic.  Eyes: Conjunctivae are normal.  Pulmonary/Chest: Effort normal.  Abdominal: Soft. She exhibits no distension. There is no tenderness. There is no rebound.  Genitourinary:  Genitourinary Comments: Vulva:  No lesion Vagina: pink, nml rugae Cervix:  Strings seen No blood  Musculoskeletal: She exhibits no edema.  Neurological: She is alert and oriented to person, place, and time.  Skin: Skin is warm and dry.  Psychiatric: She has a normal mood and affect.   Vitals:   11/19/16 1454  BP: 126/89  Pulse: 99  Weight: 276 lb (125.2 kg)  Height: 5\' 5"  (1.651 m)        Assessment & Plan:  45 yo female with resolved vaginitis and most likely had a light period due to [place].  1-Follow up prn 2-cetaphil soap in lieu of summer's eve wash

## 2016-11-20 LAB — WET PREP BY MOLECULAR PROBE
Candida species: NEGATIVE
GARDNERELLA VAGINALIS: NEGATIVE
Trichomonas vaginosis: NEGATIVE

## 2018-06-05 ENCOUNTER — Emergency Department (HOSPITAL_BASED_OUTPATIENT_CLINIC_OR_DEPARTMENT_OTHER): Payer: No Typology Code available for payment source

## 2018-06-05 ENCOUNTER — Emergency Department (HOSPITAL_BASED_OUTPATIENT_CLINIC_OR_DEPARTMENT_OTHER)
Admission: EM | Admit: 2018-06-05 | Discharge: 2018-06-05 | Disposition: A | Discharge status: Home / Self Care | Payer: No Typology Code available for payment source | Attending: Emergency Medicine | Admitting: Emergency Medicine

## 2018-06-05 ENCOUNTER — Encounter (HOSPITAL_BASED_OUTPATIENT_CLINIC_OR_DEPARTMENT_OTHER): Payer: Self-pay | Admitting: Emergency Medicine

## 2018-06-05 ENCOUNTER — Other Ambulatory Visit: Payer: Self-pay

## 2018-06-05 DIAGNOSIS — Y92009 Unspecified place in unspecified non-institutional (private) residence as the place of occurrence of the external cause: Secondary | ICD-10-CM | POA: Insufficient documentation

## 2018-06-05 DIAGNOSIS — Y999 Unspecified external cause status: Secondary | ICD-10-CM | POA: Insufficient documentation

## 2018-06-05 DIAGNOSIS — W010XXA Fall on same level from slipping, tripping and stumbling without subsequent striking against object, initial encounter: Secondary | ICD-10-CM | POA: Insufficient documentation

## 2018-06-05 DIAGNOSIS — Y9389 Activity, other specified: Secondary | ICD-10-CM | POA: Diagnosis not present

## 2018-06-05 DIAGNOSIS — M25551 Pain in right hip: Secondary | ICD-10-CM | POA: Insufficient documentation

## 2018-06-05 DIAGNOSIS — W19XXXA Unspecified fall, initial encounter: Secondary | ICD-10-CM

## 2018-06-05 LAB — PREGNANCY, URINE: Preg Test, Ur: NEGATIVE

## 2018-06-05 MED ORDER — MELOXICAM 15 MG PO TABS: 15.0000 mg | ORAL_TABLET | Freq: Every day | ORAL | 0 refills | Status: DC

## 2018-06-05 NOTE — ED Notes (Signed)
Pt given d/c instructions as per chart. Rx x 1. Verbalizes understanding. No questions. 

## 2018-06-05 NOTE — Discharge Instructions (Signed)
Your hip imaging was normal on CT scan and there is no evidence of any abnormality.  You may ice your hip.  You may have a deep bruise along the bone which is causing her pain however this should improve over the next several days.  You may take the anti-inflammatory medication I prescribed for pain.  If you need to use a cane or crutch this may be helpful.  There are no other abnormalities that I can see on your examination.  Follow-up with the sports medicine doctor or orthopedist if you are not improving.

## 2018-06-05 NOTE — ED Provider Notes (Signed)
[facility] EMERGENCY DEPARTMENT Provider Note   CSN: [id number] Arrival date & time: 06/05/18  1925     History   Chief Complaint Chief Complaint  Patient presents with  . Fall    HPI Rosibel Giacobbe Cordoba is a 47 y.o. female since the emergency department for chief complaint of right hip pain.  Patient states that she was coming out of a client's house when she slipped and fell onto her right hip and elbow 2 days ago.  She states that she is continued to have some pain on the lateral aspect of her hip and right buttock.  She is able to ambulate but is limping and has sharp pain with ambulation.  She denies any numbness or tingling in the leg, weakness.  She has been taking Goody's arthritis powder.  She denies back pain, head injury, loss of consciousness during the injury.  HPI  Past Medical History:  Diagnosis Date  . Arthritis   . Obesity     Patient Active Problem List   Diagnosis Date Noted  . Obesity 11/19/2016  . IUD check up 08/11/2016    Past Surgical History:  Procedure Laterality Date  . CESAREAN SECTION    . SHOULDER SURGERY       OB History    Gravida  2   Para  2   Term  0   Preterm  2   AB      Living  1     SAB      TAB      Ectopic      Multiple      Live Births  2            Home Medications    Prior to Admission medications   Medication Sig Start Date End Date Taking? Authorizing Provider  acetaminophen (TYLENOL) 500 MG tablet Take 500 mg by mouth every 6 (six) hours as needed. As needed for pain    [provider]    Family History Family History  Problem Relation Age of Onset  . Diabetes Paternal Grandfather   . Diabetes Paternal Grandmother   . Diabetes Maternal Grandmother   . Diabetes Maternal Grandfather     Social History Social History   Tobacco Use  . Smoking status: Never Smoker  . Smokeless tobacco: Never Used  Substance Use Topics  . Alcohol use: Yes    Comment: Social  . Drug  use: No     Allergies   Codeine and Pineapple   Review of Systems Review of Systems  Ten systems reviewed and are negative for acute change, except as noted in the HPI.   Physical Exam Updated Vital Signs BP 134/89 (BP Location: Left Arm)   Pulse 91   Temp 98.4 F (36.9 C) (Oral)   Resp 20   Ht 5\' 4"  (1.626 m)   Wt 124.7 kg (275 lb)   SpO2 99%   BMI 47.20 kg/m   Physical Exam  Constitutional: She is oriented to person, place, and time. She appears well-developed and well-nourished. No distress.  HENT:  Head: Normocephalic and atraumatic.  Eyes: Conjunctivae are normal. No scleral icterus.  Neck: Normal range of motion.  Cardiovascular: Normal rate, regular rhythm and normal heart sounds. Exam reveals no gallop and no friction rub.  No murmur heard. Pulmonary/Chest: Effort normal and breath sounds normal. No respiratory distress.  Abdominal: Soft. Bowel sounds are normal. She exhibits no distension and no mass. There is no tenderness.  There is no guarding.  Musculoskeletal:  Mild tenderness on the upper lateral aspect of the right hip without bruising, swelling.  Tenderness along the lateral right aspect of the buttock.  No significant pain with internal/external rotation or flexion extension of the hip passively.  Normal pulses bilaterally.  Neurological: She is alert and oriented to person, place, and time.  Skin: Skin is warm and dry. She is not diaphoretic.  Psychiatric: Her behavior is normal.  Nursing note and vitals reviewed.    ED Treatments / Results  Labs (all labs ordered are listed, but only abnormal results are displayed) Labs Reviewed - No data to display  EKG None  Radiology No results found.  Procedures Procedures (including critical care time)  Medications Ordered in ED Medications - No data to display   Initial Impression / Assessment and Plan / ED Course  I have reviewed the triage vital signs and the nursing notes.  Pertinent labs &  imaging results that were available during my care of the patient were reviewed by me and considered in my medical decision making (see chart for details).     Patient X-Ray negative for obvious fracture or dislocation. Pain managed in ED. Pt advised to follow up with orthopedics if symptoms persist for possibility of missed fracture diagnosis.  Patient will be dc home & is agreeable with above plan.   Final Clinical Impressions(s) / ED Diagnoses   Final diagnoses:  Fall, initial encounter  Pain of right hip joint    ED Discharge Orders    None       Margarita Mail, PA-C 06/06/18 0023    Malvin Johns, MD 06/08/18 0700

## 2018-06-05 NOTE — ED Triage Notes (Signed)
Patient states that she fell at work and she continues to have pain to her right hip

## 2018-08-07 DIAGNOSIS — M1712 Unilateral primary osteoarthritis, left knee: Secondary | ICD-10-CM | POA: Insufficient documentation

## 2018-11-18 IMAGING — CT CT HIP*R* W/O CM
2 of 3 series · 17 of 46 positions shown, 19 images · non-contrast
Comparison: Right hip x-rays from same day.

CLINICAL DATA: Right hip pain after fall. Possible fracture on
x-ray.

EXAM:
CT OF THE RIGHT HIP WITHOUT CONTRAST
TECHNIQUE: Multidetector CT imaging of the right hip was performed according to
the standard protocol. Multiplanar CT image reconstructions were
also generated.

[Series 6: axial soft tissue · axial · 0.59mm/px · z∈[-426,-184]mm · 14 of 141 slices shown, 16 images]
[im 10/141  soft-tissue]
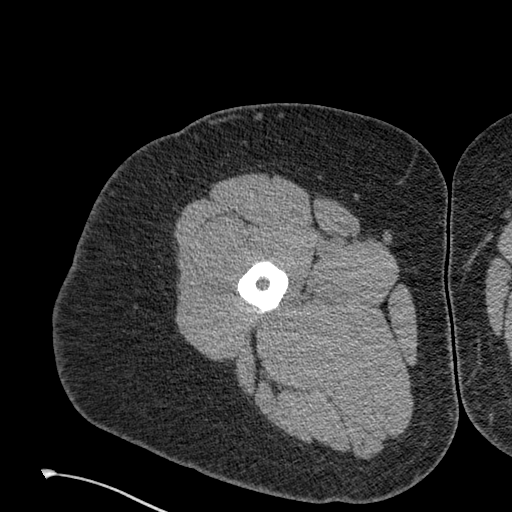
[im 10/141  bone]
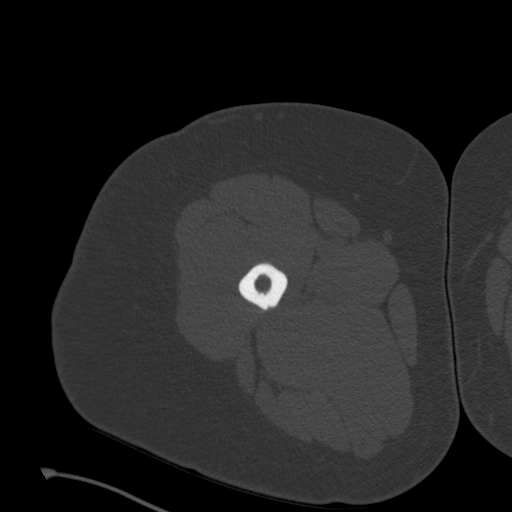
[im 19/141  soft-tissue]
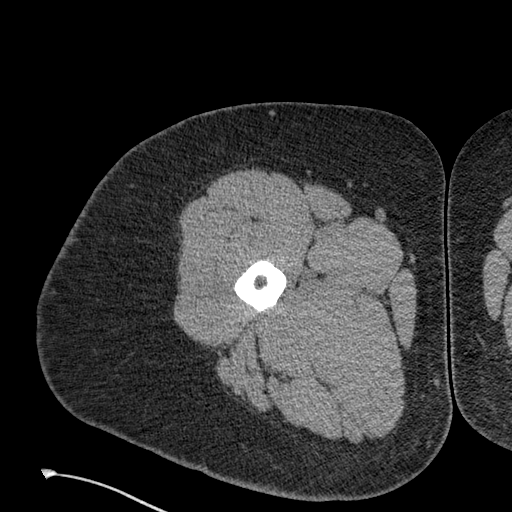
[im 28/141  soft-tissue]
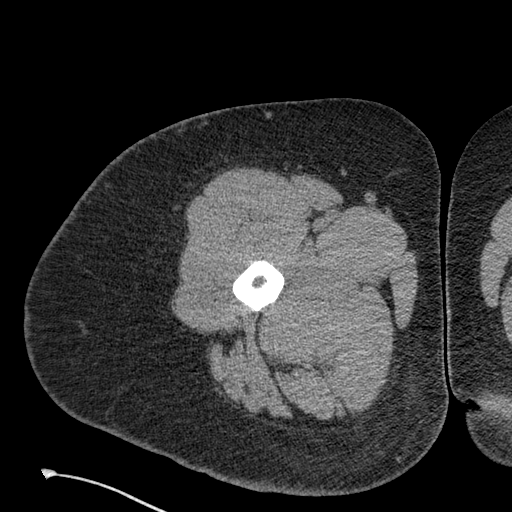
[im 37/141  soft-tissue]
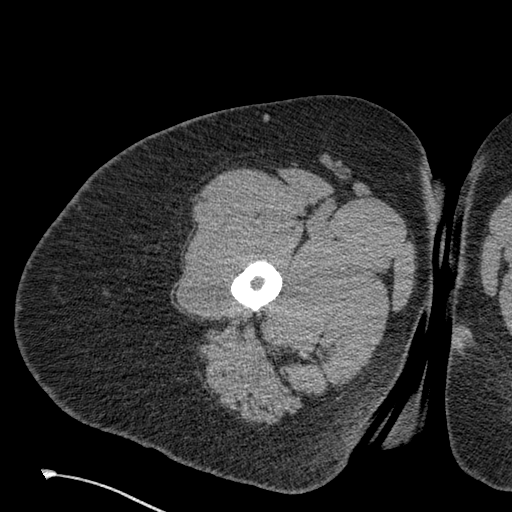
[im 46/141  soft-tissue]
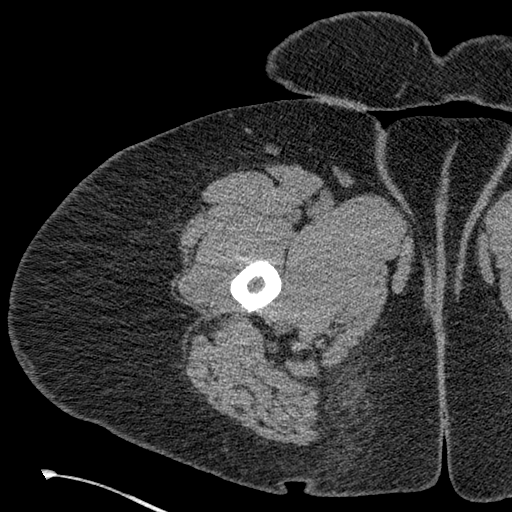
[im 55/141  soft-tissue]
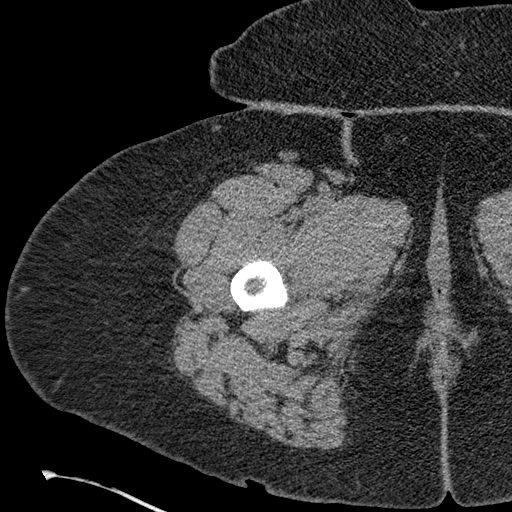
[im 64/141  soft-tissue]
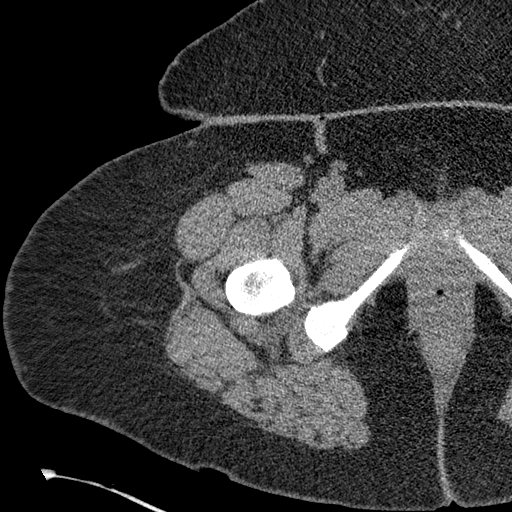
[im 77/141  soft-tissue]
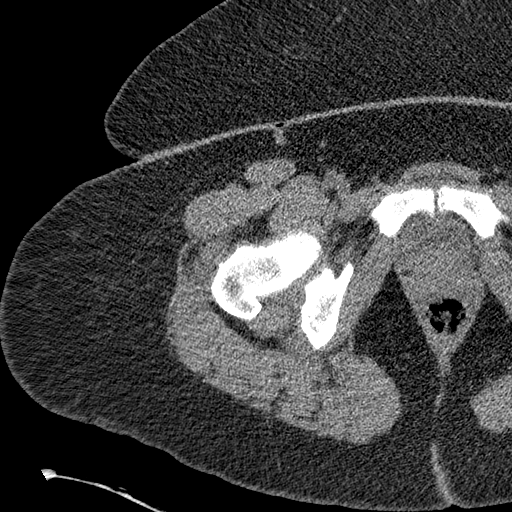
[im 86/141  soft-tissue]
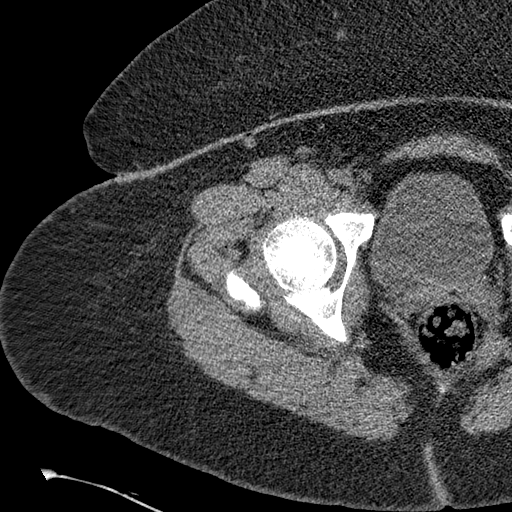
[im 86/141  bone]
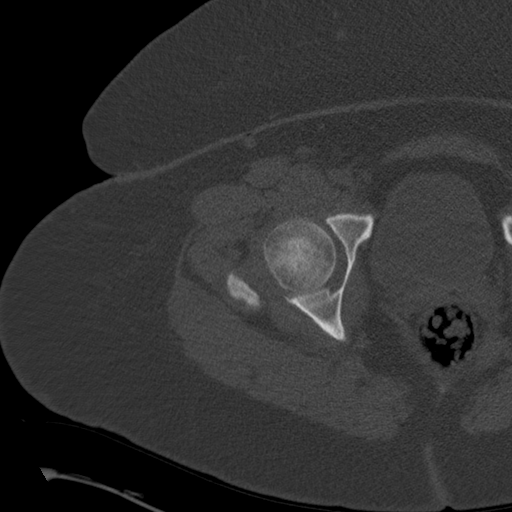
[im 95/141  soft-tissue]
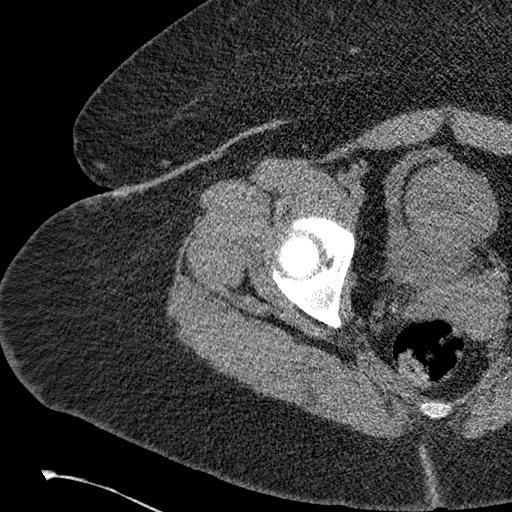
[im 104/141  soft-tissue]
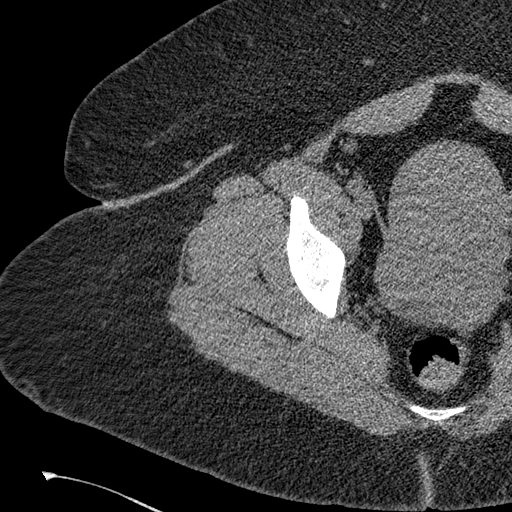
[im 113/141  soft-tissue]
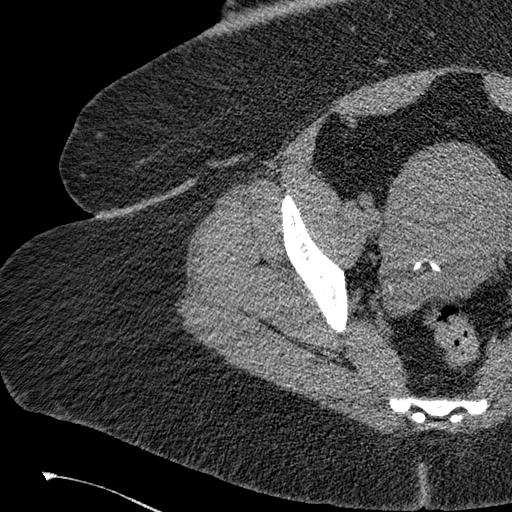
[im 122/141  soft-tissue]
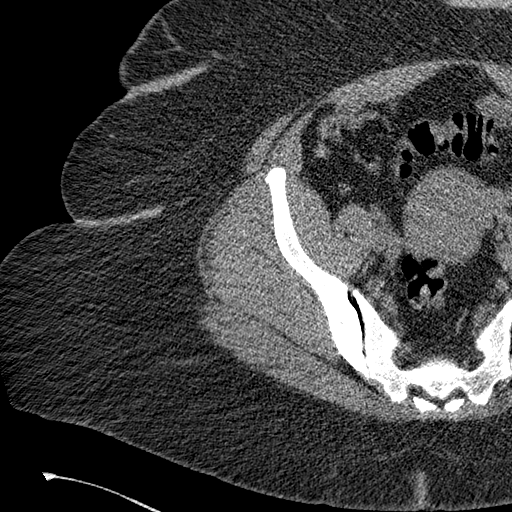
[im 131/141  soft-tissue]
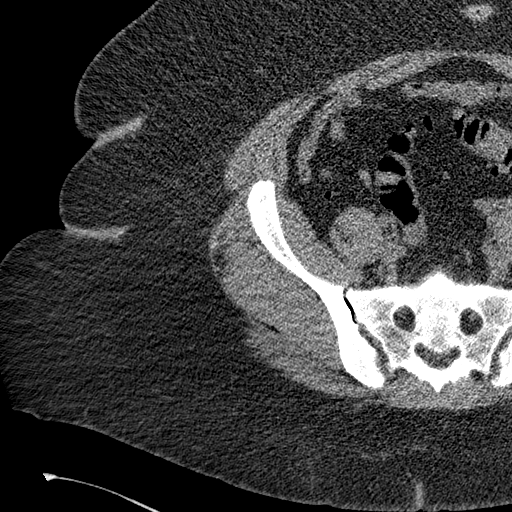

[Series 9: coronal st · coronal · 0.54mm/px · 3 of 158 slices shown]
[im 53/158  soft-tissue]
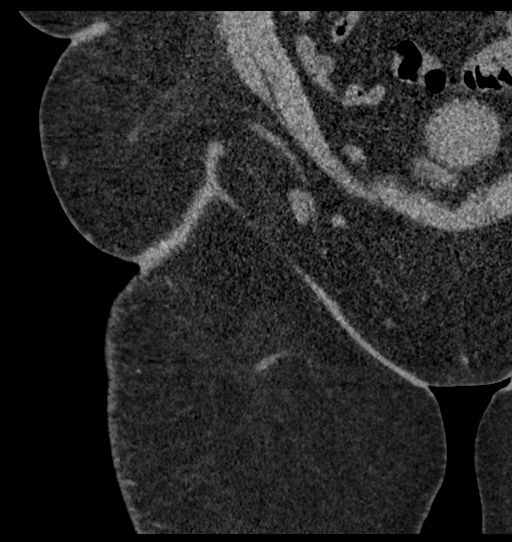
[im 70/158  soft-tissue]
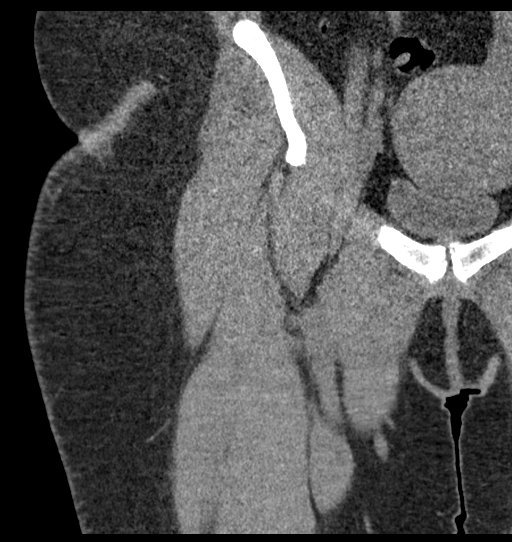
[im 88/158  soft-tissue]
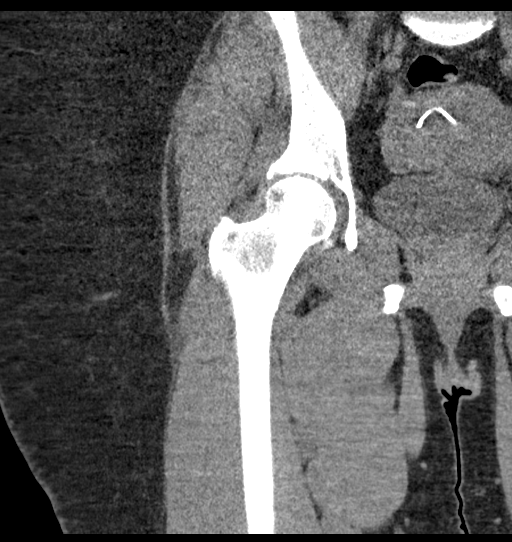

[17 of 46 positions shown; findings below may reference images not displayed]

FINDINGS: Bones/Joint/Cartilage

No acute fracture or dislocation. The right hip joint space is
preserved. Mild osteoarthritis of the right sacroiliac joint. Bone
mineralization is normal. No joint effusion.

Ligaments

Suboptimally assessed by CT.

Muscles and Tendons

No muscle atrophy. The right gluteal, iliopsoas, and hamstring
tendons are grossly intact.

Soft tissues

No soft tissue mass or fluid collection. Fibroid uterus containing
an IUD. The visualized internal pelvic structures are otherwise
unremarkable.
IMPRESSION: 1.  No acute fracture.

## 2018-12-07 HISTORY — PX: KNEE SURGERY: SHX244

## 2019-06-14 DIAGNOSIS — R7303 Prediabetes: Secondary | ICD-10-CM | POA: Insufficient documentation

## 2019-06-15 DIAGNOSIS — G43709 Chronic migraine without aura, not intractable, without status migrainosus: Secondary | ICD-10-CM | POA: Insufficient documentation

## 2020-02-26 ENCOUNTER — Ambulatory Visit (INDEPENDENT_AMBULATORY_CARE_PROVIDER_SITE_OTHER): Payer: Self-pay | Admitting: Obstetrics & Gynecology

## 2020-02-26 ENCOUNTER — Other Ambulatory Visit: Payer: Self-pay

## 2020-02-26 ENCOUNTER — Encounter: Payer: Self-pay | Admitting: Obstetrics & Gynecology

## 2020-02-26 VITALS — Ht 64.0 in | Wt 266.0 lb

## 2020-02-26 DIAGNOSIS — Z30431 Encounter for routine checking of intrauterine contraceptive device: Secondary | ICD-10-CM

## 2020-02-26 DIAGNOSIS — R0789 Other chest pain: Secondary | ICD-10-CM

## 2020-02-26 DIAGNOSIS — Z01419 Encounter for gynecological examination (general) (routine) without abnormal findings: Secondary | ICD-10-CM

## 2020-02-26 DIAGNOSIS — Z124 Encounter for screening for malignant neoplasm of cervix: Secondary | ICD-10-CM

## 2020-02-26 DIAGNOSIS — Z1151 Encounter for screening for human papillomavirus (HPV): Secondary | ICD-10-CM

## 2020-02-26 DIAGNOSIS — Z113 Encounter for screening for infections with a predominantly sexual mode of transmission: Secondary | ICD-10-CM

## 2020-02-26 MED ORDER — CELECOXIB 100 MG PO CAPS: 100.0000 mg | ORAL_CAPSULE | Freq: Two times a day (BID) | ORAL | 0 refills | Status: DC

## 2020-02-26 NOTE — Progress Notes (Signed)
Subjective:     Morgan Cline is a 49 y.o. female here for a routine exam.  Current complaints: pain in left chest wall superior to breast and inferior to clavicle.  Pain began last week.  Has improved some.  Pt doesn't remember an injury.  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  Pt has IUD (Mirena) and has a rare light menses.     Gynecologic History Patient's last menstrual period was 01/29/2020 (approximate). Contraception: diaphragm Last Pap: none on chart. Results were: normal per patient Last mammogram: 10/2019. Results were: normal  Obstetric History OB History  Gravida Para Term Preterm AB Living  2 2 0 2   1  SAB TAB Ectopic Multiple Live Births          2    # Outcome Date GA Lbr Len/2nd Weight Sex Delivery Anes PTL Lv  2 Preterm 04/08/02    M Vag-Spont EPI  LIV  1 Preterm 02/11/96    M CS-LTranv Spinal  DEC     Birth Comments: Emergency     The following portions of the patient's history were reviewed and updated as appropriate: allergies, current medications, past family history, past medical history, past social history, past surgical history and problem list.  Review of Systems Pertinent items noted in HPI and remainder of comprehensive ROS otherwise negative.    Objective:      Vitals:   02/26/20 1109  Weight: 266 lb (120.7 kg)  Height: 5\' 4"  (1.626 m)  Declined BP--cuff hurting her arm.  Vitals:  WNL General appearance: alert, cooperative and no distress  HEENT: Normocephalic, without obvious abnormality, atraumatic Eyes: negative Throat: lips, mucosa, and tongue normal; teeth and gums normal  Respiratory: Clear to auscultation bilaterally  CV: Regular rate and rhythm  Breasts:  Normal appearance, no masses or tenderness, no nipple retraction; no mass where pain is present.  Pain is reproducible over rib.    GI: Soft, non-tender; bowel sounds normal; no masses,  no organomegaly  GU: External Genitalia:  Tanner V, no lesion Urethra:  No prolapse    Vagina: Pink, normal rugae, no blood or discharge  Cervix: No CMT, no lesion; IUD strings noted.  Uterus:  Normal size and contour, non tender  Adnexa: Normal, no masses, non tender  Musculoskeletal: No edema, redness or tenderness in the calves or thighs  Skin: No lesions or rash  Lymphatic: Axillary adenopathy: none     Psychiatric: Normal mood and behavior    Assessment:    Healthy female exam.   Left chest wall pain   Plan:    Pap with co testing STD screen Yearly mammogram Left chest wall pain most c/w costochondritis or muscular.  Will try a short course of celebrex.

## 2020-02-27 LAB — CYTOLOGY - PAP
Chlamydia: NEGATIVE
Comment: NEGATIVE
Comment: NEGATIVE
Comment: NEGATIVE
Comment: NORMAL
Diagnosis: NEGATIVE
High risk HPV: NEGATIVE
Neisseria Gonorrhea: NEGATIVE
Trichomonas: POSITIVE — AB

## 2020-02-29 ENCOUNTER — Other Ambulatory Visit: Payer: Self-pay

## 2020-02-29 DIAGNOSIS — A599 Trichomoniasis, unspecified: Secondary | ICD-10-CM

## 2020-02-29 MED ORDER — METRONIDAZOLE 500 MG PO TABS: 500.0000 mg | ORAL_TABLET | Freq: Two times a day (BID) | ORAL | 0 refills | Status: DC

## 2020-02-29 NOTE — Progress Notes (Signed)
Flagyl sent for Trichomonas per Dr Gala Romney

## 2020-03-25 ENCOUNTER — Other Ambulatory Visit (INDEPENDENT_AMBULATORY_CARE_PROVIDER_SITE_OTHER): Payer: Federal, State, Local not specified - PPO

## 2020-03-25 ENCOUNTER — Other Ambulatory Visit: Payer: Self-pay

## 2020-03-25 DIAGNOSIS — M545 Low back pain, unspecified: Secondary | ICD-10-CM

## 2020-03-25 DIAGNOSIS — A599 Trichomoniasis, unspecified: Secondary | ICD-10-CM

## 2020-03-25 DIAGNOSIS — R319 Hematuria, unspecified: Secondary | ICD-10-CM | POA: Diagnosis not present

## 2020-03-25 DIAGNOSIS — Z113 Encounter for screening for infections with a predominantly sexual mode of transmission: Secondary | ICD-10-CM

## 2020-03-25 LAB — POCT URINALYSIS DIPSTICK
Bilirubin, UA: NEGATIVE
Blood, UA: NEGATIVE
Glucose, UA: NEGATIVE
Ketones, UA: NEGATIVE
Leukocytes, UA: NEGATIVE
Nitrite, UA: NEGATIVE
Protein, UA: NEGATIVE
Spec Grav, UA: 1.015 (ref 1.010–1.025)
Urobilinogen, UA: 0.2 E.U./dL
pH, UA: 6 (ref 5.0–8.0)

## 2020-03-25 NOTE — Addendum Note (Signed)
Addended by: Lyndal Rainbow on: 03/25/2020 03:14 PM   Modules accepted: Orders

## 2020-03-25 NOTE — Progress Notes (Addendum)
Pt here for TOC for Trichomonas and routine STI testing. Blood drawn and self swab done. Pt c/o lower back pain and hematuria for 3 days. Urinalysis done and urine culture sent. Pt is aware we will call with results.

## 2020-03-26 LAB — CERVICOVAGINAL ANCILLARY ONLY
Bacterial Vaginitis (gardnerella): NEGATIVE
Candida Glabrata: NEGATIVE
Candida Vaginitis: NEGATIVE
Chlamydia: NEGATIVE
Comment: NEGATIVE
Comment: NEGATIVE
Comment: NEGATIVE
Comment: NEGATIVE
Comment: NEGATIVE
Comment: NORMAL
Neisseria Gonorrhea: NEGATIVE
Trichomonas: POSITIVE — AB

## 2020-03-26 LAB — URINE CULTURE
MICRO NUMBER:: 10379193
Result:: NO GROWTH
SPECIMEN QUALITY:: ADEQUATE

## 2020-03-27 LAB — HIV ANTIBODY (ROUTINE TESTING W REFLEX): HIV 1&2 Ab, 4th Generation: NONREACTIVE

## 2020-03-27 LAB — HEPATITIS B SURFACE ANTIGEN: Hepatitis B Surface Ag: NONREACTIVE

## 2020-03-27 LAB — RPR: RPR Ser Ql: NONREACTIVE

## 2020-03-27 LAB — HEPATITIS C ANTIBODY
Hepatitis C Ab: NONREACTIVE
SIGNAL TO CUT-OFF: 0.24 (ref <1.00)

## 2020-03-28 ENCOUNTER — Telehealth: Payer: Self-pay

## 2020-03-28 DIAGNOSIS — A599 Trichomoniasis, unspecified: Secondary | ICD-10-CM

## 2020-03-28 MED ORDER — METRONIDAZOLE 500 MG PO TABS: 500.0000 mg | ORAL_TABLET | Freq: Two times a day (BID) | ORAL | 0 refills | Status: DC

## 2020-03-28 NOTE — Telephone Encounter (Signed)
error 

## 2020-03-28 NOTE — Telephone Encounter (Signed)
Pt is aware she is positive for Trichomonas via MyChart. Flagyl sent to pharmacy. Pt is aware she will need to do TOC in at least 3 weeks.

## 2020-04-22 ENCOUNTER — Other Ambulatory Visit: Payer: Self-pay

## 2020-04-22 ENCOUNTER — Ambulatory Visit (INDEPENDENT_AMBULATORY_CARE_PROVIDER_SITE_OTHER): Payer: Federal, State, Local not specified - PPO | Admitting: *Deleted

## 2020-04-22 ENCOUNTER — Other Ambulatory Visit (HOSPITAL_COMMUNITY)
Admission: RE | Admit: 2020-04-22 | Discharge: 2020-04-22 | Disposition: A | Discharge status: Home / Self Care | Payer: Federal, State, Local not specified - PPO | Source: Ambulatory Visit | Attending: Obstetrics & Gynecology | Admitting: Obstetrics & Gynecology

## 2020-04-22 DIAGNOSIS — A599 Trichomoniasis, unspecified: Secondary | ICD-10-CM | POA: Insufficient documentation

## 2020-04-22 NOTE — Progress Notes (Signed)
Pt here for TOC for Trich.  She has been treated x 2 and swears that she has not had intercourse since taking the meds.

## 2020-04-23 LAB — CERVICOVAGINAL ANCILLARY ONLY
Comment: NEGATIVE
Trichomonas: NEGATIVE

## 2021-04-30 ENCOUNTER — Other Ambulatory Visit (HOSPITAL_BASED_OUTPATIENT_CLINIC_OR_DEPARTMENT_OTHER): Payer: Self-pay

## 2021-04-30 ENCOUNTER — Emergency Department (HOSPITAL_BASED_OUTPATIENT_CLINIC_OR_DEPARTMENT_OTHER)
Admission: EM | Admit: 2021-04-30 | Discharge: 2021-04-30 | Disposition: A | Discharge status: Home / Self Care | Payer: Federal, State, Local not specified - PPO | Attending: Emergency Medicine | Admitting: Emergency Medicine

## 2021-04-30 ENCOUNTER — Encounter (HOSPITAL_BASED_OUTPATIENT_CLINIC_OR_DEPARTMENT_OTHER): Payer: Self-pay | Admitting: Urology

## 2021-04-30 ENCOUNTER — Other Ambulatory Visit: Payer: Self-pay

## 2021-04-30 DIAGNOSIS — R109 Unspecified abdominal pain: Secondary | ICD-10-CM | POA: Diagnosis not present

## 2021-04-30 DIAGNOSIS — R197 Diarrhea, unspecified: Secondary | ICD-10-CM | POA: Insufficient documentation

## 2021-04-30 MED ORDER — LOPERAMIDE HCL 2 MG PO CAPS
2.0000 mg | ORAL_CAPSULE | Freq: Four times a day (QID) | ORAL | 0 refills | Status: DC | PRN
  Filled 2021-04-30: qty 12, 3d supply, fill #0

## 2021-04-30 MED ORDER — DICYCLOMINE HCL 20 MG PO TABS
20.0000 mg | ORAL_TABLET | Freq: Three times a day (TID) | ORAL | 0 refills | Status: DC | PRN
  Filled 2021-04-30: qty 10, 4d supply, fill #0

## 2021-04-30 NOTE — ED Triage Notes (Addendum)
Pt reports abdominal cramping and diarrhea, watery stools since Monday night after eating at [facility] hospital.  Pt states nausea.

## 2021-04-30 NOTE — ED Provider Notes (Signed)
Morgan Cline Provider Note   CSN: [id number] Arrival date & time: 04/30/21  0830     History Chief Complaint  Patient presents with  . Abdominal Pain  . Diarrhea    Morgan Cline is a 50 y.o. female.  HPI Patient presents with abdominal cramping and diarrhea.  Began around 2 days ago.  Thinks it could have been from a hamburger she had at work.  No real nausea but having diarrhea.  Watery.  No blood.  States the pain in the abdomen will get severe.  Cramps.  Clears up and having bowel movement.  Did have 1 episode of incontinence.  No fevers.  No definite sick contacts.  Does not feel lightheaded or dizzy but feels little fatigued.    Past Medical History:  Diagnosis Date  . Arthritis   . Obesity     Patient Active Problem List   Diagnosis Date Noted  . Chronic migraine without aura without status migrainosus, not intractable 06/15/2019  . Prediabetes 06/14/2019  . Primary osteoarthritis of left knee 08/07/2018  . IUD check up 08/11/2016  . BMI 40.0-44.9, adult ([place]) 06/30/2013  . Vitamin D deficiency 06/30/2013    Past Surgical History:  Procedure Laterality Date  . CESAREAN SECTION    . KNEE SURGERY    . SHOULDER SURGERY       OB History    Gravida  2   Para  2   Term  0   Preterm  2   AB      Living  1     SAB      IAB      Ectopic      Multiple      Live Births  2           Family History  Problem Relation Age of Onset  . Diabetes Paternal Grandfather   . Diabetes Paternal Grandmother   . Diabetes Maternal Grandmother   . Diabetes Maternal Grandfather     Social History   Tobacco Use  . Smoking status: Never Smoker  . Smokeless tobacco: Never Used  Substance Use Topics  . Alcohol use: Yes    Comment: Social  . Drug use: No    Home Medications Prior to Admission medications   Medication Sig Start Date End Date Taking? Authorizing Provider  dicyclomine (BENTYL) 20 MG tablet Take 1 tablet  (20 mg total) by mouth 3 (three) times daily as needed for spasms. 04/30/21  Yes Davonna Belling, MD  loperamide (IMODIUM) 2 MG capsule Take 1 capsule (2 mg total) by mouth 4 (four) times daily as needed for diarrhea or loose stools. 04/30/21  Yes Davonna Belling, MD  acetaminophen (TYLENOL) 500 MG tablet Take 500 mg by mouth every 6 (six) hours as needed. As needed for pain    [provider]  celecoxib (CELEBREX) 100 MG capsule Take 1 capsule (100 mg total) by mouth 2 (two) times daily. 02/26/20   Guss Bunde, MD  levonorgestrel (MIRENA) 20 MCG/24HR IUD by Intrauterine route.    [provider]  meloxicam (MOBIC) 15 MG tablet Take 1 tablet (15 mg total) by mouth daily. Take 1 daily with food. 06/05/18   Margarita Mail, PA-C    Allergies    Codeine and Pineapple  Review of Systems   Review of Systems  Constitutional: Positive for appetite change and fatigue. Negative for fever.  HENT: Negative for congestion.   Gastrointestinal: Positive for abdominal pain and  diarrhea. Negative for vomiting.  Genitourinary: Negative for flank pain.  Musculoskeletal: Negative for back pain.  Skin: Negative for rash.  Neurological: Negative for weakness.  Psychiatric/Behavioral: Negative for confusion.    Physical Exam Updated Vital Signs BP 134/85 (BP Location: Right Arm)   Pulse 76   Temp 98.1 F (36.7 C) (Oral)   Resp 18   Ht 5\' 4"  (1.626 m)   Wt 120.2 kg   SpO2 98%   BMI 45.49 kg/m   Physical Exam Vitals reviewed.  HENT:     Head: Normocephalic.     Mouth/Throat:     Mouth: Mucous membranes are moist.  Cardiovascular:     Rate and Rhythm: Normal rate.  Pulmonary:     Effort: Pulmonary effort is normal.  Abdominal:     Tenderness: There is no abdominal tenderness.     Hernia: No hernia is present.  Skin:    General: Skin is warm.     Capillary Refill: Capillary refill takes less than 2 seconds.  Neurological:     Mental Status: She is alert and oriented  to person, place, and time.  Psychiatric:        Mood and Affect: Mood normal.     ED Results / Procedures / Treatments   Labs (all labs ordered are listed, but only abnormal results are displayed) Labs Reviewed - No data to display  EKG None  Radiology No results found.  Procedures Procedures   Medications Ordered in ED Medications - No data to display  ED Course  I have reviewed the triage vital signs and the nursing notes.  Pertinent labs & imaging results that were available during my care of the patient were reviewed by me and considered in my medical decision making (see chart for details).    MDM Rules/Calculators/A&P                          Patient with diarrhea.  Does not appear dehydrated.  Has had 2 days of symptoms.  Crampy symptoms.  Will give Imodium and Bentyl for symptom relief.  Well-appearing.  Discharge home with outpatient follow-up as needed.  No tenderness on abdomen. Final Clinical Impression(s) / ED Diagnoses Final diagnoses:  Diarrhea, unspecified type    Rx / DC Orders ED Discharge Orders         Ordered    loperamide (IMODIUM) 2 MG capsule  4 times daily PRN        04/30/21 0852    dicyclomine (BENTYL) 20 MG tablet  3 times daily PRN        04/30/21 7116           Davonna Belling, MD 04/30/21 [phone]

## 2021-04-30 NOTE — ED Notes (Signed)
ED Provider at bedside. 

## 2022-05-29 ENCOUNTER — Encounter (HOSPITAL_BASED_OUTPATIENT_CLINIC_OR_DEPARTMENT_OTHER): Payer: Self-pay

## 2022-05-29 ENCOUNTER — Telehealth (HOSPITAL_BASED_OUTPATIENT_CLINIC_OR_DEPARTMENT_OTHER): Payer: Self-pay | Admitting: Emergency Medicine

## 2022-05-29 ENCOUNTER — Emergency Department (HOSPITAL_BASED_OUTPATIENT_CLINIC_OR_DEPARTMENT_OTHER): Payer: Federal, State, Local not specified - PPO

## 2022-05-29 ENCOUNTER — Emergency Department (HOSPITAL_BASED_OUTPATIENT_CLINIC_OR_DEPARTMENT_OTHER)
Admission: EM | Admit: 2022-05-29 | Discharge: 2022-05-29 | Disposition: A | Discharge status: Home / Self Care | Payer: Federal, State, Local not specified - PPO | Source: Home / Self Care | Attending: Emergency Medicine | Admitting: Emergency Medicine

## 2022-05-29 ENCOUNTER — Other Ambulatory Visit: Payer: Self-pay

## 2022-05-29 DIAGNOSIS — R1032 Left lower quadrant pain: Secondary | ICD-10-CM | POA: Insufficient documentation

## 2022-05-29 DIAGNOSIS — R112 Nausea with vomiting, unspecified: Secondary | ICD-10-CM | POA: Insufficient documentation

## 2022-05-29 DIAGNOSIS — R197 Diarrhea, unspecified: Secondary | ICD-10-CM | POA: Insufficient documentation

## 2022-05-29 DIAGNOSIS — K5732 Diverticulitis of large intestine without perforation or abscess without bleeding: Secondary | ICD-10-CM | POA: Diagnosis not present

## 2022-05-29 DIAGNOSIS — K5792 Diverticulitis of intestine, part unspecified, without perforation or abscess without bleeding: Secondary | ICD-10-CM

## 2022-05-29 DIAGNOSIS — K5720 Diverticulitis of large intestine with perforation and abscess without bleeding: Secondary | ICD-10-CM | POA: Diagnosis not present

## 2022-05-29 DIAGNOSIS — D259 Leiomyoma of uterus, unspecified: Secondary | ICD-10-CM

## 2022-05-29 LAB — URINALYSIS, ROUTINE W REFLEX MICROSCOPIC
Bilirubin Urine: NEGATIVE
Glucose, UA: NEGATIVE mg/dL
Hgb urine dipstick: NEGATIVE
Ketones, ur: NEGATIVE mg/dL
Leukocytes,Ua: NEGATIVE
Nitrite: NEGATIVE
Protein, ur: NEGATIVE mg/dL
Specific Gravity, Urine: 1.017 (ref 1.005–1.030)
pH: 7 (ref 5.0–8.0)

## 2022-05-29 LAB — CBC WITH DIFFERENTIAL/PLATELET
Abs Immature Granulocytes: 0.02 10*3/uL (ref 0.00–0.07)
Basophils Absolute: 0 10*3/uL (ref 0.0–0.1)
Basophils Relative: 0 %
Eosinophils Absolute: 0.1 10*3/uL (ref 0.0–0.5)
Eosinophils Relative: 1 %
HCT: 40.8 % (ref 36.0–46.0)
Hemoglobin: 13.3 g/dL (ref 12.0–15.0)
Immature Granulocytes: 0 %
Lymphocytes Relative: 13 %
Lymphs Abs: 1 10*3/uL (ref 0.7–4.0)
MCH: 28.8 pg (ref 26.0–34.0)
MCHC: 32.6 g/dL (ref 30.0–36.0)
MCV: 88.3 fL (ref 80.0–100.0)
Monocytes Absolute: 0.4 10*3/uL (ref 0.1–1.0)
Monocytes Relative: 5 %
Neutro Abs: 6.4 10*3/uL (ref 1.7–7.7)
Neutrophils Relative %: 81 %
Platelets: 305 10*3/uL (ref 150–400)
RBC: 4.62 MIL/uL (ref 3.87–5.11)
RDW: 14.5 % (ref 11.5–15.5)
WBC: 8 10*3/uL (ref 4.0–10.5)
nRBC: 0 % (ref 0.0–0.2)

## 2022-05-29 LAB — COMPREHENSIVE METABOLIC PANEL
ALT: 19 U/L (ref 0–44)
AST: 13 U/L — ABNORMAL LOW (ref 15–41)
Albumin: 3.7 g/dL (ref 3.5–5.0)
Alkaline Phosphatase: 83 U/L (ref 38–126)
Anion gap: 10 (ref 5–15)
BUN: 12 mg/dL (ref 6–20)
CO2: 25 mmol/L (ref 22–32)
Calcium: 9.4 mg/dL (ref 8.9–10.3)
Chloride: 103 mmol/L (ref 98–111)
Creatinine, Ser: 0.92 mg/dL (ref 0.44–1.00)
GFR, Estimated: >60 mL/min (ref >60)
Glucose, Bld: 116 mg/dL — ABNORMAL HIGH (ref 70–99)
Potassium: 3.9 mmol/L (ref 3.5–5.1)
Sodium: 138 mmol/L (ref 135–145)
Total Bilirubin: 0.6 mg/dL (ref 0.3–1.2)
Total Protein: 7.4 g/dL (ref 6.5–8.1)

## 2022-05-29 LAB — LIPASE, BLOOD: Lipase: 36 U/L (ref 11–51)

## 2022-05-29 LAB — PREGNANCY, URINE: Preg Test, Ur: NEGATIVE

## 2022-05-29 MED ORDER — FENTANYL CITRATE PF 50 MCG/ML IJ SOSY
50.0000 ug | PREFILLED_SYRINGE | Freq: Once | INTRAMUSCULAR | Status: AC
  Administered 2022-05-29: 50 ug via INTRAVENOUS
  Filled 2022-05-29: qty 1

## 2022-05-29 MED ORDER — IOHEXOL 300 MG/ML  SOLN
100.0000 mL | Freq: Once | INTRAMUSCULAR | Status: AC | PRN
  Administered 2022-05-29: 100 mL via INTRAVENOUS

## 2022-05-29 MED ORDER — AMOXICILLIN-POT CLAVULANATE 875-125 MG PO TABS: 1.0000 | ORAL_TABLET | Freq: Two times a day (BID) | ORAL | 0 refills | Status: DC

## 2022-05-29 MED ORDER — SODIUM CHLORIDE 0.9 % IV BOLUS
1000.0000 mL | Freq: Once | INTRAVENOUS | Status: AC
  Administered 2022-05-29: 1000 mL via INTRAVENOUS

## 2022-05-29 MED ORDER — AMOXICILLIN-POT CLAVULANATE 875-125 MG PO TABS
1.0000 | ORAL_TABLET | Freq: Two times a day (BID) | ORAL | 0 refills | Status: DC
Start: 2022-05-29 — End: 2022-07-01

## 2022-05-29 MED ORDER — HYDROCODONE-ACETAMINOPHEN 5-325 MG PO TABS
1.0000 | ORAL_TABLET | Freq: Four times a day (QID) | ORAL | 0 refills | Status: DC | PRN
Start: 2022-05-29 — End: 2022-07-01

## 2022-05-29 MED ORDER — ONDANSETRON HCL 4 MG/2ML IJ SOLN
4.0000 mg | Freq: Once | INTRAMUSCULAR | Status: AC
  Administered 2022-05-29: 4 mg via INTRAVENOUS
  Filled 2022-05-29: qty 2

## 2022-05-29 MED ORDER — IBUPROFEN 600 MG PO TABS: 600.0000 mg | ORAL_TABLET | Freq: Four times a day (QID) | ORAL | 0 refills | Status: DC | PRN

## 2022-05-29 MED ORDER — ONDANSETRON HCL 4 MG PO TABS: 4.0000 mg | ORAL_TABLET | ORAL | 0 refills | Status: DC | PRN

## 2022-05-29 MED ORDER — IBUPROFEN 600 MG PO TABS
600.0000 mg | ORAL_TABLET | Freq: Four times a day (QID) | ORAL | 0 refills | Status: DC | PRN
Start: 2022-05-29 — End: 2022-11-19

## 2022-05-29 MED ORDER — HYDROCODONE-ACETAMINOPHEN 5-325 MG PO TABS
1.0000 | ORAL_TABLET | Freq: Once | ORAL | Status: AC
  Administered 2022-05-29: 1 via ORAL
  Filled 2022-05-29: qty 1

## 2022-05-29 NOTE — ED Triage Notes (Signed)
POV, pt sts that around 6 or 7pm she began having diarrhea, vomiting began around 1230am, pt sts that shes having lower left quad cramping pain but was diffuse abd pain earlier in the evening. Alert and oriented x 4, amb to room. Denies abd surgeries.

## 2022-05-31 ENCOUNTER — Encounter (HOSPITAL_BASED_OUTPATIENT_CLINIC_OR_DEPARTMENT_OTHER): Payer: Self-pay | Admitting: Emergency Medicine

## 2022-05-31 ENCOUNTER — Other Ambulatory Visit: Payer: Self-pay

## 2022-05-31 ENCOUNTER — Emergency Department (HOSPITAL_BASED_OUTPATIENT_CLINIC_OR_DEPARTMENT_OTHER): Payer: Federal, State, Local not specified - PPO

## 2022-05-31 ENCOUNTER — Inpatient Hospital Stay (HOSPITAL_BASED_OUTPATIENT_CLINIC_OR_DEPARTMENT_OTHER)
Admission: EM | Admit: 2022-05-31 | Discharge: 2022-07-01 | DRG: 330 | Disposition: A | Discharge status: Skilled Nursing Facility | Payer: Federal, State, Local not specified - PPO | Attending: Internal Medicine | Admitting: Internal Medicine

## 2022-05-31 DIAGNOSIS — Z885 Allergy status to narcotic agent status: Secondary | ICD-10-CM

## 2022-05-31 DIAGNOSIS — K9189 Other postprocedural complications and disorders of digestive system: Secondary | ICD-10-CM | POA: Diagnosis not present

## 2022-05-31 DIAGNOSIS — R7303 Prediabetes: Secondary | ICD-10-CM | POA: Diagnosis present

## 2022-05-31 DIAGNOSIS — D62 Acute posthemorrhagic anemia: Secondary | ICD-10-CM | POA: Diagnosis not present

## 2022-05-31 DIAGNOSIS — K5732 Diverticulitis of large intestine without perforation or abscess without bleeding: Secondary | ICD-10-CM | POA: Diagnosis present

## 2022-05-31 DIAGNOSIS — M1990 Unspecified osteoarthritis, unspecified site: Secondary | ICD-10-CM | POA: Diagnosis present

## 2022-05-31 DIAGNOSIS — R000 Tachycardia, unspecified: Secondary | ICD-10-CM

## 2022-05-31 DIAGNOSIS — E8809 Other disorders of plasma-protein metabolism, not elsewhere classified: Secondary | ICD-10-CM | POA: Diagnosis present

## 2022-05-31 DIAGNOSIS — G43909 Migraine, unspecified, not intractable, without status migrainosus: Secondary | ICD-10-CM | POA: Diagnosis present

## 2022-05-31 DIAGNOSIS — K5720 Diverticulitis of large intestine with perforation and abscess without bleeding: Principal | ICD-10-CM | POA: Diagnosis present

## 2022-05-31 DIAGNOSIS — E871 Hypo-osmolality and hyponatremia: Secondary | ICD-10-CM

## 2022-05-31 DIAGNOSIS — E66813 Obesity, class 3: Secondary | ICD-10-CM | POA: Diagnosis present

## 2022-05-31 DIAGNOSIS — R0689 Other abnormalities of breathing: Secondary | ICD-10-CM | POA: Diagnosis not present

## 2022-05-31 DIAGNOSIS — R7401 Elevation of levels of liver transaminase levels: Secondary | ICD-10-CM | POA: Diagnosis not present

## 2022-05-31 DIAGNOSIS — T402X5A Adverse effect of other opioids, initial encounter: Secondary | ICD-10-CM | POA: Diagnosis not present

## 2022-05-31 DIAGNOSIS — E876 Hypokalemia: Secondary | ICD-10-CM

## 2022-05-31 DIAGNOSIS — Z833 Family history of diabetes mellitus: Secondary | ICD-10-CM

## 2022-05-31 DIAGNOSIS — D72825 Bandemia: Secondary | ICD-10-CM

## 2022-05-31 DIAGNOSIS — J9811 Atelectasis: Secondary | ICD-10-CM | POA: Diagnosis not present

## 2022-05-31 DIAGNOSIS — R197 Diarrhea, unspecified: Secondary | ICD-10-CM

## 2022-05-31 DIAGNOSIS — K567 Ileus, unspecified: Secondary | ICD-10-CM | POA: Diagnosis not present

## 2022-05-31 DIAGNOSIS — Z791 Long term (current) use of non-steroidal anti-inflammatories (NSAID): Secondary | ICD-10-CM

## 2022-05-31 DIAGNOSIS — D75839 Thrombocytosis, unspecified: Secondary | ICD-10-CM

## 2022-05-31 DIAGNOSIS — D649 Anemia, unspecified: Secondary | ICD-10-CM | POA: Diagnosis present

## 2022-05-31 DIAGNOSIS — Z975 Presence of (intrauterine) contraceptive device: Secondary | ICD-10-CM

## 2022-05-31 DIAGNOSIS — R188 Other ascites: Secondary | ICD-10-CM | POA: Diagnosis not present

## 2022-05-31 DIAGNOSIS — Z79899 Other long term (current) drug therapy: Secondary | ICD-10-CM

## 2022-05-31 DIAGNOSIS — Z6841 Body Mass Index (BMI) 40.0 and over, adult: Secondary | ICD-10-CM

## 2022-05-31 DIAGNOSIS — K660 Peritoneal adhesions (postprocedural) (postinfection): Secondary | ICD-10-CM | POA: Diagnosis present

## 2022-05-31 DIAGNOSIS — R739 Hyperglycemia, unspecified: Secondary | ICD-10-CM

## 2022-05-31 LAB — CBC
HCT: 42.6 % (ref 36.0–46.0)
Hemoglobin: 14.1 g/dL (ref 12.0–15.0)
MCH: 29 pg (ref 26.0–34.0)
MCHC: 33.1 g/dL (ref 30.0–36.0)
MCV: 87.7 fL (ref 80.0–100.0)
Platelets: 321 10*3/uL (ref 150–400)
RBC: 4.86 MIL/uL (ref 3.87–5.11)
RDW: 14 % (ref 11.5–15.5)
WBC: 12.2 10*3/uL — ABNORMAL HIGH (ref 4.0–10.5)
nRBC: 0 % (ref 0.0–0.2)

## 2022-05-31 LAB — COMPREHENSIVE METABOLIC PANEL
ALT: 15 U/L (ref 0–44)
AST: 14 U/L — ABNORMAL LOW (ref 15–41)
Albumin: 3.2 g/dL — ABNORMAL LOW (ref 3.5–5.0)
Alkaline Phosphatase: 83 U/L (ref 38–126)
Anion gap: 8 (ref 5–15)
BUN: 10 mg/dL (ref 6–20)
CO2: 23 mmol/L (ref 22–32)
Calcium: 8.4 mg/dL — ABNORMAL LOW (ref 8.9–10.3)
Chloride: 102 mmol/L (ref 98–111)
Creatinine, Ser: 0.9 mg/dL (ref 0.44–1.00)
GFR, Estimated: >60 mL/min (ref >60)
Glucose, Bld: 143 mg/dL — ABNORMAL HIGH (ref 70–99)
Potassium: 3.7 mmol/L (ref 3.5–5.1)
Sodium: 133 mmol/L — ABNORMAL LOW (ref 135–145)
Total Bilirubin: 0.8 mg/dL (ref 0.3–1.2)
Total Protein: 7.6 g/dL (ref 6.5–8.1)

## 2022-05-31 LAB — LIPASE, BLOOD: Lipase: 23 U/L (ref 11–51)

## 2022-05-31 LAB — HCG, QUANTITATIVE, PREGNANCY: hCG, Beta Chain, Quant, S: <1 m[IU]/mL (ref <5)

## 2022-05-31 MED ORDER — IOHEXOL 300 MG/ML  SOLN
100.0000 mL | Freq: Once | INTRAMUSCULAR | Status: AC | PRN
Start: 2022-05-31 — End: 2022-05-31
  Administered 2022-05-31: 100 mL via INTRAVENOUS

## 2022-05-31 MED ORDER — SODIUM CHLORIDE 0.9 % IV BOLUS
1000.0000 mL | Freq: Once | INTRAVENOUS | Status: AC
  Administered 2022-05-31: 1000 mL via INTRAVENOUS

## 2022-05-31 MED ORDER — HYDROMORPHONE HCL 1 MG/ML IJ SOLN
1.0000 mg | Freq: Once | INTRAMUSCULAR | Status: AC
  Administered 2022-05-31: 1 mg via INTRAVENOUS
  Filled 2022-05-31: qty 1

## 2022-05-31 MED ORDER — ONDANSETRON HCL 4 MG/2ML IJ SOLN
4.0000 mg | Freq: Once | INTRAMUSCULAR | Status: AC
  Administered 2022-05-31: 4 mg via INTRAVENOUS
  Filled 2022-05-31: qty 2

## 2022-05-31 MED ORDER — FENTANYL CITRATE PF 50 MCG/ML IJ SOSY
50.0000 ug | PREFILLED_SYRINGE | Freq: Once | INTRAMUSCULAR | Status: AC
  Administered 2022-05-31: 50 ug via INTRAVENOUS
  Filled 2022-05-31: qty 1

## 2022-05-31 NOTE — ED Notes (Signed)
Pt still remains very uncomfortable, 10/10 pain.  Recently given dose of Fentanyl and repositioned. Continuing to monitor

## 2022-05-31 NOTE — ED Provider Notes (Signed)
[facility] EMERGENCY DEPARTMENT Provider Note   CSN: [id number] Arrival date & time: 05/31/22  2149     History  Chief Complaint  Patient presents with   Abdominal Pain    Morgan Cline is a 51 y.o. female who presents the emergency department complaining of abdominal pain, nausea and vomiting.  Patient was recently seen in the ER 2 days ago and diagnosed with diverticulitis.  She was discharged home with medication.  She states that she had some improvement when she first got home, but when she was trying to increase her diet she had worsening pain.  States pain is in the same location as it was before, and her left lower quadrant.  Denies any fever.   Abdominal Pain Associated symptoms: nausea and vomiting   Associated symptoms: no diarrhea and no fever        Home Medications Prior to Admission medications   Medication Sig Start Date End Date Taking? Authorizing Provider  acetaminophen (TYLENOL) 500 MG tablet Take 500 mg by mouth every 6 (six) hours as needed. As needed for pain    [provider]  amoxicillin-clavulanate (AUGMENTIN) 875-125 MG tablet Take 1 tablet by mouth every 12 (twelve) hours. 05/29/22   Jeanell Sparrow, DO  celecoxib (CELEBREX) 100 MG capsule Take 1 capsule (100 mg total) by mouth 2 (two) times daily. 02/26/20   Guss Bunde, MD  dicyclomine (BENTYL) 20 MG tablet Take 1 tablet (20 mg total) by mouth 3 (three) times daily as needed for spasms. 04/30/21   Davonna Belling, MD  HYDROcodone-acetaminophen (NORCO) 5-325 MG tablet Take 1 tablet by mouth every 6 (six) hours as needed for moderate pain. 05/29/22   Jeanell Sparrow, DO  ibuprofen (ADVIL) 600 MG tablet Take 1 tablet (600 mg total) by mouth every 6 (six) hours as needed. 05/29/22   Jeanell Sparrow, DO  levonorgestrel (MIRENA) 20 MCG/24HR IUD by Intrauterine route.    [provider]  loperamide (IMODIUM) 2 MG capsule Take 1 capsule (2 mg total) by mouth 4 (four) times daily  as needed for diarrhea or loose stools. 04/30/21   Davonna Belling, MD  meloxicam (MOBIC) 15 MG tablet Take 1 tablet (15 mg total) by mouth daily. Take 1 daily with food. 06/05/18   Margarita Mail, PA-C  ondansetron (ZOFRAN) 4 MG tablet Take 1 tablet (4 mg total) by mouth every 4 (four) hours as needed for nausea or vomiting. 05/29/22   Jeanell Sparrow, DO      Allergies    Codeine and Pineapple    Review of Systems   Review of Systems  Constitutional:  Negative for fever.  Gastrointestinal:  Positive for abdominal pain, nausea and vomiting. Negative for diarrhea.  All other systems reviewed and are negative.   Physical Exam Updated Vital Signs BP 102/69   Pulse 85   Temp 98.2 F (36.8 C) (Oral)   Resp 18   Ht '5\' 4"'$  (1.626 m)   Wt 106.6 kg   SpO2 96%   BMI 40.34 kg/m  Physical Exam Vitals and nursing note reviewed.  Constitutional:      Appearance: Normal appearance.  HENT:     Head: Normocephalic and atraumatic.  Eyes:     Conjunctiva/sclera: Conjunctivae normal.  Cardiovascular:     Rate and Rhythm: Normal rate and regular rhythm.  Pulmonary:     Effort: Pulmonary effort is normal. No respiratory distress.     Breath sounds: Normal breath sounds.  Abdominal:  General: There is no distension.     Palpations: Abdomen is soft.     Tenderness: There is abdominal tenderness in the left lower quadrant. There is no guarding or rebound.  Skin:    General: Skin is warm and dry.  Neurological:     General: No focal deficit present.     Mental Status: She is alert.    ED Results / Procedures / Treatments   Labs (all labs ordered are listed, but only abnormal results are displayed) Labs Reviewed  COMPREHENSIVE METABOLIC PANEL - Abnormal; Notable for the following components:      Result Value   Sodium 133 (*)    Glucose, Bld 143 (*)    Calcium 8.4 (*)    Albumin 3.2 (*)    AST 14 (*)    All other components within normal limits  CBC - Abnormal; Notable for the  following components:   WBC 12.2 (*)    All other components within normal limits  LIPASE, BLOOD  URINALYSIS, ROUTINE W REFLEX MICROSCOPIC  HCG, QUANTITATIVE, PREGNANCY    EKG None  Radiology No results found.  Procedures Procedures    Medications Ordered in ED Medications  sodium chloride 0.9 % bolus 1,000 mL (1,000 mLs Intravenous New Bag/Given 05/31/22 2245)  fentaNYL (SUBLIMAZE) injection 50 mcg (50 mcg Intravenous Given 05/31/22 2246)  ondansetron (ZOFRAN) injection 4 mg (4 mg Intravenous Given 05/31/22 2245)  HYDROmorphone (DILAUDID) injection 1 mg (1 mg Intravenous Given 05/31/22 2315)    ED Course/ Medical Decision Making/ A&P                           Medical Decision Making Amount and/or Complexity of Data Reviewed Labs: ordered. Radiology: ordered.  Risk Prescription drug management.  This patient is a 51 y.o. female  who presents to the ED for concern of abdominal pain. Recently dx with diverticulitis.   Past Medical History / Co-morbidities: Diverticulitis, migraine, uterine leiomyoma  Additional history: Chart reviewed. Pertinent results include: Patient seen in the ER on 6/23 and diagnosed with uncomplicated diverticulitis.  She was discharged home with oral antibiotics, antiemetics, and scheduled to see GI in 6 weeks for colonoscopy.  Physical Exam: Physical exam performed. The pertinent findings include: Abdominal tenderness, worse in LLQ. Afebrile, not tachycardic. Borderline soft blood pressures.   Lab Tests/Imaging studies: I personally interpreted labs/imaging and the pertinent results include: Leukocytosis of 12.2, up from 8.0 two days ago.  Electrolytes grossly within normal limits. Albumin 3.2. Normal lipase. HCG quantitative pending.   CT abdomen/pelvis ordered, results pending at time of shift change.    Medications: I ordered medication including IV fluids, antiemetics and analgesics.  I have reviewed the patients home medicines and have  made adjustments as needed.  Disposition: Patient discussed and care transferred to attending physician Dr. Karle Starch at shift change. Please see his/her note for further details regarding further ED course and disposition. Plan at time of handoff is give adequate pain control and follow up on CT. Disposition depending on scan findings.    Final Clinical Impression(s) / ED Diagnoses Final diagnoses:  None    Rx / DC Orders ED Discharge Orders     None      Portions of this report may have been transcribed using voice recognition software. Every effort was made to ensure accuracy; however, inadvertent computerized transcription errors may be present.    Kateri Plummer, PA-C 05/31/22 2330    Long,  [facility], MD 06/03/22 1019

## 2022-05-31 NOTE — ED Triage Notes (Signed)
Seen at [facility] on Friday.  Diagnosed with diverticulitis.  Reports continued pain and nausea.  Taking augmentin, vicodin, ibuprofen, and zofran at home.

## 2022-06-01 ENCOUNTER — Inpatient Hospital Stay (HOSPITAL_COMMUNITY): Payer: Federal, State, Local not specified - PPO

## 2022-06-01 ENCOUNTER — Encounter (HOSPITAL_COMMUNITY): Payer: Self-pay | Admitting: Internal Medicine

## 2022-06-01 DIAGNOSIS — R9431 Abnormal electrocardiogram [ECG] [EKG]: Secondary | ICD-10-CM | POA: Diagnosis not present

## 2022-06-01 DIAGNOSIS — R188 Other ascites: Secondary | ICD-10-CM | POA: Diagnosis not present

## 2022-06-01 DIAGNOSIS — K5732 Diverticulitis of large intestine without perforation or abscess without bleeding: Secondary | ICD-10-CM | POA: Diagnosis present

## 2022-06-01 DIAGNOSIS — R7303 Prediabetes: Secondary | ICD-10-CM | POA: Diagnosis present

## 2022-06-01 DIAGNOSIS — E8809 Other disorders of plasma-protein metabolism, not elsewhere classified: Secondary | ICD-10-CM | POA: Diagnosis present

## 2022-06-01 DIAGNOSIS — K5720 Diverticulitis of large intestine with perforation and abscess without bleeding: Secondary | ICD-10-CM | POA: Diagnosis present

## 2022-06-01 DIAGNOSIS — Z975 Presence of (intrauterine) contraceptive device: Secondary | ICD-10-CM | POA: Diagnosis not present

## 2022-06-01 DIAGNOSIS — R739 Hyperglycemia, unspecified: Secondary | ICD-10-CM | POA: Diagnosis present

## 2022-06-01 DIAGNOSIS — G43909 Migraine, unspecified, not intractable, without status migrainosus: Secondary | ICD-10-CM | POA: Diagnosis present

## 2022-06-01 DIAGNOSIS — E871 Hypo-osmolality and hyponatremia: Secondary | ICD-10-CM | POA: Diagnosis present

## 2022-06-01 DIAGNOSIS — D649 Anemia, unspecified: Secondary | ICD-10-CM | POA: Diagnosis not present

## 2022-06-01 DIAGNOSIS — R0689 Other abnormalities of breathing: Secondary | ICD-10-CM | POA: Diagnosis not present

## 2022-06-01 DIAGNOSIS — Z79899 Other long term (current) drug therapy: Secondary | ICD-10-CM | POA: Diagnosis not present

## 2022-06-01 DIAGNOSIS — D72825 Bandemia: Secondary | ICD-10-CM | POA: Diagnosis not present

## 2022-06-01 DIAGNOSIS — K9189 Other postprocedural complications and disorders of digestive system: Secondary | ICD-10-CM | POA: Diagnosis not present

## 2022-06-01 DIAGNOSIS — Z833 Family history of diabetes mellitus: Secondary | ICD-10-CM | POA: Diagnosis not present

## 2022-06-01 DIAGNOSIS — M1990 Unspecified osteoarthritis, unspecified site: Secondary | ICD-10-CM | POA: Diagnosis present

## 2022-06-01 DIAGNOSIS — R7401 Elevation of levels of liver transaminase levels: Secondary | ICD-10-CM | POA: Diagnosis not present

## 2022-06-01 DIAGNOSIS — K567 Ileus, unspecified: Secondary | ICD-10-CM | POA: Diagnosis not present

## 2022-06-01 DIAGNOSIS — K660 Peritoneal adhesions (postprocedural) (postinfection): Secondary | ICD-10-CM | POA: Diagnosis present

## 2022-06-01 DIAGNOSIS — Z6841 Body Mass Index (BMI) 40.0 and over, adult: Secondary | ICD-10-CM | POA: Diagnosis not present

## 2022-06-01 DIAGNOSIS — A09 Infectious gastroenteritis and colitis, unspecified: Secondary | ICD-10-CM | POA: Diagnosis not present

## 2022-06-01 DIAGNOSIS — E876 Hypokalemia: Secondary | ICD-10-CM | POA: Diagnosis not present

## 2022-06-01 DIAGNOSIS — T402X5A Adverse effect of other opioids, initial encounter: Secondary | ICD-10-CM | POA: Diagnosis not present

## 2022-06-01 DIAGNOSIS — J9811 Atelectasis: Secondary | ICD-10-CM | POA: Diagnosis not present

## 2022-06-01 DIAGNOSIS — D62 Acute posthemorrhagic anemia: Secondary | ICD-10-CM | POA: Diagnosis not present

## 2022-06-01 DIAGNOSIS — D75839 Thrombocytosis, unspecified: Secondary | ICD-10-CM | POA: Diagnosis not present

## 2022-06-01 LAB — CBC WITH DIFFERENTIAL/PLATELET
Abs Immature Granulocytes: 0.07 10*3/uL (ref 0.00–0.07)
Basophils Absolute: 0 10*3/uL (ref 0.0–0.1)
Basophils Relative: 0 %
Eosinophils Absolute: 0.1 10*3/uL (ref 0.0–0.5)
Eosinophils Relative: 0 %
HCT: 41.6 % (ref 36.0–46.0)
Hemoglobin: 13.6 g/dL (ref 12.0–15.0)
Immature Granulocytes: 1 %
Lymphocytes Relative: 5 %
Lymphs Abs: 0.7 10*3/uL (ref 0.7–4.0)
MCH: 29 pg (ref 26.0–34.0)
MCHC: 32.7 g/dL (ref 30.0–36.0)
MCV: 88.7 fL (ref 80.0–100.0)
Monocytes Absolute: 0.6 10*3/uL (ref 0.1–1.0)
Monocytes Relative: 5 %
Neutro Abs: 12 10*3/uL — ABNORMAL HIGH (ref 1.7–7.7)
Neutrophils Relative %: 89 %
Platelets: 265 10*3/uL (ref 150–400)
RBC: 4.69 MIL/uL (ref 3.87–5.11)
RDW: 14 % (ref 11.5–15.5)
WBC: 13.5 10*3/uL — ABNORMAL HIGH (ref 4.0–10.5)
nRBC: 0 % (ref 0.0–0.2)

## 2022-06-01 LAB — HEPATIC FUNCTION PANEL
ALT: 13 U/L (ref 0–44)
AST: 12 U/L — ABNORMAL LOW (ref 15–41)
Albumin: 3 g/dL — ABNORMAL LOW (ref 3.5–5.0)
Alkaline Phosphatase: 70 U/L (ref 38–126)
Bilirubin, Direct: 0.2 mg/dL (ref 0.0–0.2)
Indirect Bilirubin: 0.5 mg/dL (ref 0.3–0.9)
Total Bilirubin: 0.7 mg/dL (ref 0.3–1.2)
Total Protein: 6.7 g/dL (ref 6.5–8.1)

## 2022-06-01 LAB — URINALYSIS, ROUTINE W REFLEX MICROSCOPIC
Bilirubin Urine: NEGATIVE
Glucose, UA: NEGATIVE mg/dL
Hgb urine dipstick: NEGATIVE
Ketones, ur: NEGATIVE mg/dL
Leukocytes,Ua: NEGATIVE
Nitrite: NEGATIVE
Protein, ur: NEGATIVE mg/dL
Specific Gravity, Urine: <=1.005 (ref 1.005–1.030)
pH: 5.5 (ref 5.0–8.0)

## 2022-06-01 LAB — BASIC METABOLIC PANEL
Anion gap: 8 (ref 5–15)
BUN: 7 mg/dL (ref 6–20)
CO2: 22 mmol/L (ref 22–32)
Calcium: 7.9 mg/dL — ABNORMAL LOW (ref 8.9–10.3)
Chloride: 107 mmol/L (ref 98–111)
Creatinine, Ser: 0.8 mg/dL (ref 0.44–1.00)
GFR, Estimated: >60 mL/min (ref >60)
Glucose, Bld: 116 mg/dL — ABNORMAL HIGH (ref 70–99)
Potassium: 4.2 mmol/L (ref 3.5–5.1)
Sodium: 137 mmol/L (ref 135–145)

## 2022-06-01 LAB — GLUCOSE, CAPILLARY
Glucose-Capillary: 143 mg/dL — ABNORMAL HIGH (ref 70–99)
Glucose-Capillary: 116 mg/dL — ABNORMAL HIGH (ref 70–99)

## 2022-06-01 LAB — HIV ANTIBODY (ROUTINE TESTING W REFLEX): HIV Screen 4th Generation wRfx: NONREACTIVE

## 2022-06-01 MED ORDER — SODIUM CHLORIDE 0.9 % IV SOLN
3.0000 g | Freq: Four times a day (QID) | INTRAVENOUS | Status: DC
  Administered 2022-06-01: 3 g via INTRAVENOUS
  Filled 2022-06-01 (×2): qty 8

## 2022-06-01 MED ORDER — METRONIDAZOLE 500 MG/100ML IV SOLN
500.0000 mg | Freq: Two times a day (BID) | INTRAVENOUS | Status: DC
  Administered 2022-06-01 – 2022-06-03 (×4): 500 mg via INTRAVENOUS
  Filled 2022-06-01 (×4): qty 100

## 2022-06-01 MED ORDER — HYDROMORPHONE HCL 1 MG/ML IJ SOLN
0.5000 mg | INTRAMUSCULAR | Status: DC | PRN
  Administered 2022-06-01 – 2022-06-03 (×8): 0.5 mg via INTRAVENOUS
  Filled 2022-06-01 (×8): qty 0.5

## 2022-06-01 MED ORDER — METRONIDAZOLE 500 MG/100ML IV SOLN
500.0000 mg | Freq: Two times a day (BID) | INTRAVENOUS | Status: DC
  Administered 2022-06-01: 500 mg via INTRAVENOUS
  Filled 2022-06-01: qty 100

## 2022-06-01 MED ORDER — SODIUM CHLORIDE 0.9 % IV SOLN
2.0000 g | INTRAVENOUS | Status: DC
  Administered 2022-06-01 – 2022-06-02 (×2): 2 g via INTRAVENOUS
  Filled 2022-06-01 (×2): qty 20

## 2022-06-01 MED ORDER — LACTATED RINGERS IV SOLN: INTRAVENOUS | Status: AC

## 2022-06-01 MED ORDER — ONDANSETRON HCL 4 MG/2ML IJ SOLN
4.0000 mg | Freq: Four times a day (QID) | INTRAMUSCULAR | Status: DC | PRN
  Administered 2022-06-01 – 2022-06-04 (×5): 4 mg via INTRAVENOUS
  Filled 2022-06-01 (×5): qty 2

## 2022-06-01 MED ORDER — SODIUM CHLORIDE 0.9 % IV SOLN
1.0000 g | Freq: Once | INTRAVENOUS | Status: AC
  Administered 2022-06-01: 1 g via INTRAVENOUS
  Filled 2022-06-01: qty 10

## 2022-06-01 MED ORDER — SODIUM CHLORIDE 0.9 % IV BOLUS
1000.0000 mL | Freq: Once | INTRAVENOUS | Status: AC
Start: 2022-06-01 — End: 2022-06-01
  Administered 2022-06-01: 1000 mL via INTRAVENOUS

## 2022-06-01 MED ORDER — FENTANYL CITRATE PF 50 MCG/ML IJ SOSY
25.0000 ug | PREFILLED_SYRINGE | INTRAMUSCULAR | Status: DC | PRN
  Administered 2022-06-01 (×2): 25 ug via INTRAVENOUS
  Filled 2022-06-01 (×2): qty 1

## 2022-06-01 MED ORDER — FENTANYL CITRATE PF 50 MCG/ML IJ SOSY
50.0000 ug | PREFILLED_SYRINGE | Freq: Once | INTRAMUSCULAR | Status: AC
  Administered 2022-06-01: 50 ug via INTRAVENOUS
  Filled 2022-06-01: qty 1

## 2022-06-01 MED ORDER — SODIUM CHLORIDE 0.9 % IV SOLN: 2.0000 g | INTRAVENOUS | Status: DC

## 2022-06-01 NOTE — Progress Notes (Signed)
  [facility] will assume care on arrival to accepting facility. Until arrival, care as per EDP. However, [facility] available 24/7 for questions and assistance.   Nursing staff please page [facility] Admits and Consults ([phone]) as soon as the patient arrives to the hospital.  Maribelle Hopple, DO [facility]  

## 2022-06-02 DIAGNOSIS — R197 Diarrhea, unspecified: Secondary | ICD-10-CM

## 2022-06-02 DIAGNOSIS — K5720 Diverticulitis of large intestine with perforation and abscess without bleeding: Secondary | ICD-10-CM | POA: Diagnosis not present

## 2022-06-02 DIAGNOSIS — E871 Hypo-osmolality and hyponatremia: Secondary | ICD-10-CM | POA: Diagnosis not present

## 2022-06-02 DIAGNOSIS — R739 Hyperglycemia, unspecified: Secondary | ICD-10-CM

## 2022-06-02 DIAGNOSIS — K5732 Diverticulitis of large intestine without perforation or abscess without bleeding: Secondary | ICD-10-CM | POA: Diagnosis not present

## 2022-06-02 LAB — RENAL FUNCTION PANEL
Albumin: 2.6 g/dL — ABNORMAL LOW (ref 3.5–5.0)
Anion gap: 7 (ref 5–15)
BUN: 8 mg/dL (ref 6–20)
CO2: 24 mmol/L (ref 22–32)
Calcium: 8.2 mg/dL — ABNORMAL LOW (ref 8.9–10.3)
Chloride: 109 mmol/L (ref 98–111)
Creatinine, Ser: 0.84 mg/dL (ref 0.44–1.00)
GFR, Estimated: >60 mL/min (ref >60)
Glucose, Bld: 110 mg/dL — ABNORMAL HIGH (ref 70–99)
Phosphorus: 3.2 mg/dL (ref 2.5–4.6)
Potassium: 3.7 mmol/L (ref 3.5–5.1)
Sodium: 140 mmol/L (ref 135–145)

## 2022-06-02 LAB — GLUCOSE, CAPILLARY
Glucose-Capillary: 101 mg/dL — ABNORMAL HIGH (ref 70–99)
Glucose-Capillary: 108 mg/dL — ABNORMAL HIGH (ref 70–99)
Glucose-Capillary: 118 mg/dL — ABNORMAL HIGH (ref 70–99)
Glucose-Capillary: 129 mg/dL — ABNORMAL HIGH (ref 70–99)
Glucose-Capillary: 129 mg/dL — ABNORMAL HIGH (ref 70–99)

## 2022-06-02 LAB — HEMOGLOBIN A1C
Hgb A1c MFr Bld: 5.6 % (ref 4.8–5.6)
Mean Plasma Glucose: 114 mg/dL

## 2022-06-02 LAB — CBC
HCT: 36 % (ref 36.0–46.0)
Hemoglobin: 11.6 g/dL — ABNORMAL LOW (ref 12.0–15.0)
MCH: 29.1 pg (ref 26.0–34.0)
MCHC: 32.2 g/dL (ref 30.0–36.0)
MCV: 90.2 fL (ref 80.0–100.0)
Platelets: 300 10*3/uL (ref 150–400)
RBC: 3.99 MIL/uL (ref 3.87–5.11)
RDW: 14.3 % (ref 11.5–15.5)
WBC: 12.6 10*3/uL — ABNORMAL HIGH (ref 4.0–10.5)
nRBC: 0 % (ref 0.0–0.2)

## 2022-06-02 LAB — MAGNESIUM: Magnesium: 1.9 mg/dL (ref 1.7–2.4)

## 2022-06-02 MED ORDER — ALUM & MAG HYDROXIDE-SIMETH 200-200-20 MG/5ML PO SUSP
30.0000 mL | ORAL | Status: DC | PRN
  Administered 2022-06-02 – 2022-06-22 (×4): 30 mL via ORAL
  Filled 2022-06-02 (×4): qty 30

## 2022-06-03 DIAGNOSIS — E871 Hypo-osmolality and hyponatremia: Secondary | ICD-10-CM | POA: Diagnosis not present

## 2022-06-03 DIAGNOSIS — E876 Hypokalemia: Secondary | ICD-10-CM

## 2022-06-03 DIAGNOSIS — K5720 Diverticulitis of large intestine with perforation and abscess without bleeding: Secondary | ICD-10-CM | POA: Diagnosis not present

## 2022-06-03 DIAGNOSIS — K5732 Diverticulitis of large intestine without perforation or abscess without bleeding: Secondary | ICD-10-CM | POA: Diagnosis not present

## 2022-06-03 LAB — CBC
HCT: 39.4 % (ref 36.0–46.0)
Hemoglobin: 13.1 g/dL (ref 12.0–15.0)
MCH: 29.2 pg (ref 26.0–34.0)
MCHC: 33.2 g/dL (ref 30.0–36.0)
MCV: 87.8 fL (ref 80.0–100.0)
Platelets: 358 10*3/uL (ref 150–400)
RBC: 4.49 MIL/uL (ref 3.87–5.11)
RDW: 14.2 % (ref 11.5–15.5)
WBC: 13.9 10*3/uL — ABNORMAL HIGH (ref 4.0–10.5)
nRBC: 0 % (ref 0.0–0.2)

## 2022-06-03 LAB — RENAL FUNCTION PANEL
Albumin: 2.9 g/dL — ABNORMAL LOW (ref 3.5–5.0)
Anion gap: 9 (ref 5–15)
BUN: 6 mg/dL (ref 6–20)
CO2: 23 mmol/L (ref 22–32)
Calcium: 8.5 mg/dL — ABNORMAL LOW (ref 8.9–10.3)
Chloride: 108 mmol/L (ref 98–111)
Creatinine, Ser: 0.77 mg/dL (ref 0.44–1.00)
GFR, Estimated: >60 mL/min (ref >60)
Glucose, Bld: 123 mg/dL — ABNORMAL HIGH (ref 70–99)
Phosphorus: 3.4 mg/dL (ref 2.5–4.6)
Potassium: 3.5 mmol/L (ref 3.5–5.1)
Sodium: 140 mmol/L (ref 135–145)

## 2022-06-03 LAB — GLUCOSE, CAPILLARY
Glucose-Capillary: 107 mg/dL — ABNORMAL HIGH (ref 70–99)
Glucose-Capillary: 114 mg/dL — ABNORMAL HIGH (ref 70–99)

## 2022-06-03 LAB — MAGNESIUM: Magnesium: 2.1 mg/dL (ref 1.7–2.4)

## 2022-06-03 MED ORDER — HYDROMORPHONE HCL 1 MG/ML IJ SOLN
0.5000 mg | INTRAMUSCULAR | Status: AC | PRN
  Administered 2022-06-03 – 2022-06-04 (×6): 1 mg via INTRAVENOUS
  Filled 2022-06-03 (×6): qty 1

## 2022-06-03 MED ORDER — METHOCARBAMOL 500 MG PO TABS
500.0000 mg | ORAL_TABLET | Freq: Once | ORAL | Status: AC
  Administered 2022-06-03: 500 mg via ORAL
  Filled 2022-06-03: qty 1

## 2022-06-03 MED ORDER — PIPERACILLIN-TAZOBACTAM 3.375 G IVPB
3.3750 g | Freq: Three times a day (TID) | INTRAVENOUS | Status: AC
  Administered 2022-06-03 – 2022-06-16 (×39): 3.375 g via INTRAVENOUS
  Filled 2022-06-03 (×40): qty 50

## 2022-06-03 MED ORDER — POTASSIUM CHLORIDE CRYS ER 20 MEQ PO TBCR
40.0000 meq | EXTENDED_RELEASE_TABLET | Freq: Once | ORAL | Status: AC
  Administered 2022-06-03: 40 meq via ORAL
  Filled 2022-06-03: qty 2

## 2022-06-03 MED ORDER — ENOXAPARIN SODIUM 40 MG/0.4ML IJ SOSY
40.0000 mg | PREFILLED_SYRINGE | INTRAMUSCULAR | Status: DC
  Administered 2022-06-03 – 2022-06-30 (×28): 40 mg via SUBCUTANEOUS
  Filled 2022-06-03 (×28): qty 0.4

## 2022-06-03 NOTE — Progress Notes (Signed)
PROGRESS NOTE  Morgan Cline QHU:[id number] DOB: 01/08/1971   PCP: [facility]  Patient is from: Home.  DOA: 05/31/2022 LOS: 2  Chief complaints Chief Complaint  Patient presents with   Abdominal Pain     Brief Narrative / Interim history: 51 year old F with PMH of morbid obesity, prediabetes and migraine headache returning with worsening abdominal pain due to acute sigmoid diverticulitis despite oral antibiotics.  Patient was seen in ED 2 days prior and diagnosed with sigmoid diverticulitis for which she was discharged on p.o. Augmentin.  However, she continued to have worsening abdominal pain and return for further evaluation.  In ED, vitals stable.  CMP with mild hyponatremia and hyperglycemia.  CBC with mild leukocytosis with left shift.  Pregnancy test and UA negative.  CT abdomen and pelvis with contrast showed increase in the degree of sigmoid diverticulitis with evidence of new micro perforations and free air, mild increase in free fluid in pelvis but no abscess.    Initially started on ceftriaxone and Flagyl, which was later changed to IV Zosyn.  Surgery following.   Subjective: Seen and examined earlier this morning.  She thinks she feels worse today.  She has nausea and abdominal pain with bowel movement.  She has watery bowel movements every time she goes to the bathroom for #1 on #2.  She denies emesis.  Denies dysuria.   Objective: Vitals:   06/02/22 0521 06/02/22 1319 06/02/22 2051 06/03/22 0531  BP: 106/76 104/69 104/75 (!) 121/96  Pulse: (!) 103 (!) 110 (!) 105 (!) 103  Resp: '18 18 18 18  '$ Temp: 98.3 F (36.8 C) 98.6 F (37 C) 98.6 F (37 C) 98.8 F (37.1 C)  TempSrc: Oral Oral Oral Oral  SpO2: 96% 97% 97% 96%  Weight:      Height:        Examination:  GENERAL: No apparent distress.  Nontoxic. HEENT: MMM.  Vision and hearing grossly intact.  NECK: Supple.  No apparent JVD.  RESP:  No IWOB.  Fair aeration bilaterally. CVS:  RRR. Heart  sounds normal.  ABD/GI/GU: BS+. Abd soft.  Diffuse tenderness.  More pronounced over LLQ.  No rebound. MSK/EXT:  Moves extremities. No apparent deformity. No edema.  SKIN: no apparent skin lesion or wound NEURO: Awake and alert. Oriented appropriately.  No apparent focal neuro deficit. PSYCH: Calm. Normal affect.   Procedures:  None  Microbiology summarized: None  Assessment and plan: Principal Problem:   Perforation of sigmoid colon due to diverticulitis Active Problems:   Obesity, Class III, BMI 40-49.9 (morbid obesity) (HCC)   Sigmoid diverticulitis   Diarrhea   Hyperglycemia   Hyponatremia   Hypokalemia  Sigmoid diverticulitis with microperforation colon: No abscess on CT but mild increase in pelvic free fluid.  Progressive despite oral antibiotics for 2 days.  Free air reported on CT abdomen but none seen on follow-up KUB.  Slightly worse with nausea and pain today. -General surgery following. -CTX and Flagyl 6/26-6/28>> IV Zosyn 6/28>>> -On clear liquid diet, IV fluid, antiemetics and analgesics -Needs colonoscopy outpatient in 4 to 6 weeks if she has not had recent colonoscopy.  Diarrhea: Likely due to the above.   -Monitor  Hyperglycemia: History of prediabetes.  She is on Ozempic at home.  A1c 5.6%.  Hyperglycemia improved.  Morbid obesity Body mass index is 43.63 kg/m. -Encourage lifestyle change to lose weight.  Hyponatremia: Resolved.  Hypokalemia: K3.5.  Mg within normal. -P.o. KCl 40x1   DVT prophylaxis:  SCDs  Start: 06/01/22 0431  Code Status: Full code Family Communication: None at bedside Level of care: Med-Surg Status is: Inpatient Remains inpatient appropriate because: Sigmoid diverticulitis with microperforation requiring IV antibiotics   Final disposition: Likely home once medically cleared. Consultants:  General surgery  Sch Meds:  Scheduled Meds: Continuous Infusions:  piperacillin-tazobactam (ZOSYN)  IV 3.375 g (06/03/22 1326)    PRN Meds:.alum & mag hydroxide-simeth, HYDROmorphone (DILAUDID) injection, ondansetron (ZOFRAN) IV  Antimicrobials: Anti-infectives (From admission, onward)    Start     Dose/Rate Route Frequency Ordered Stop   06/03/22 1400  piperacillin-tazobactam (ZOSYN) IVPB 3.375 g        3.375 g 12.5 mL/hr over 240 Minutes Intravenous Every 8 hours 06/03/22 0900     06/02/22 0000  cefTRIAXone (ROCEPHIN) 2 g in sodium chloride 0.9 % 100 mL IVPB  Status:  Discontinued        2 g 200 mL/hr over 30 Minutes Intravenous Every 24 hours 06/01/22 1302 06/03/22 0856   06/01/22 1400  metroNIDAZOLE (FLAGYL) IVPB 500 mg  Status:  Discontinued        500 mg 100 mL/hr over 60 Minutes Intravenous Every 12 hours 06/01/22 1302 06/03/22 0856   06/01/22 1200  cefTRIAXone (ROCEPHIN) 2 g in sodium chloride 0.9 % 100 mL IVPB  Status:  Discontinued        2 g 200 mL/hr over 30 Minutes Intravenous Every 24 hours 06/01/22 0432 06/01/22 0801   06/01/22 0900  Ampicillin-Sulbactam (UNASYN) 3 g in sodium chloride 0.9 % 100 mL IVPB  Status:  Discontinued        3 g 200 mL/hr over 30 Minutes Intravenous Every 6 hours 06/01/22 0801 06/01/22 1302   06/01/22 0030  cefTRIAXone (ROCEPHIN) 1 g in sodium chloride 0.9 % 100 mL IVPB        1 g 200 mL/hr over 30 Minutes Intravenous  Once 06/01/22 0027 06/01/22 0155   06/01/22 0030  metroNIDAZOLE (FLAGYL) IVPB 500 mg  Status:  Discontinued        500 mg 100 mL/hr over 60 Minutes Intravenous Every 12 hours 06/01/22 0027 06/01/22 0801        I have personally reviewed the following labs and images: CBC: Recent Labs  Lab 05/29/22 0601 05/31/22 2224 06/01/22 0450 06/02/22 0428 06/03/22 0449  WBC 8.0 12.2* 13.5* 12.6* 13.9*  NEUTROABS 6.4  --  12.0*  --   --   HGB 13.3 14.1 13.6 11.6* 13.1  HCT 40.8 42.6 41.6 36.0 39.4  MCV 88.3 87.7 88.7 90.2 87.8  PLT 305 321 265 300 358   BMP &GFR Recent Labs  Lab 05/29/22 0601 05/31/22 2224 06/01/22 0450 06/02/22 0428  06/03/22 0449  NA 138 133* 137 140 140  K 3.9 3.7 4.2 3.7 3.5  CL 103 102 107 109 108  CO2 '25 23 22 24 23  '$ GLUCOSE 116* 143* 116* 110* 123*  BUN '12 10 7 8 6  '$ CREATININE 0.92 0.90 0.80 0.84 0.77  CALCIUM 9.4 8.4* 7.9* 8.2* 8.5*  MG  --   --   --  1.9 2.1  PHOS  --   --   --  3.2 3.4   Estimated Creatinine Clearance: 107.1 mL/min (by C-G formula based on SCr of 0.77 mg/dL). Liver & Pancreas: Recent Labs  Lab 05/29/22 0601 05/31/22 2224 06/01/22 0450 06/02/22 0428 06/03/22 0449  AST 13* 14* 12*  --   --   ALT '19 15 13  '$ --   --  ALKPHOS 83 83 70  --   --   BILITOT 0.6 0.8 0.7  --   --   PROT 7.4 7.6 6.7  --   --   ALBUMIN 3.7 3.2* 3.0* 2.6* 2.9*   Recent Labs  Lab 05/29/22 0601 05/31/22 2224  LIPASE 36 23   No results for input(s): "AMMONIA" in the last 168 hours. Diabetic: Recent Labs    06/01/22 1153  HGBA1C 5.6   Recent Labs  Lab 06/02/22 0755 06/02/22 1706 06/02/22 2001 06/02/22 2343 06/03/22 0720  GLUCAP 108* 101* 129* 129* 107*   Cardiac Enzymes: No results for input(s): "CKTOTAL", "CKMB", "CKMBINDEX", "TROPONINI" in the last 168 hours. No results for input(s): "PROBNP" in the last 8760 hours. Coagulation Profile: No results for input(s): "INR", "PROTIME" in the last 168 hours. Thyroid Function Tests: No results for input(s): "TSH", "T4TOTAL", "FREET4", "T3FREE", "THYROIDAB" in the last 72 hours. Lipid Profile: No results for input(s): "CHOL", "HDL", "LDLCALC", "TRIG", "CHOLHDL", "LDLDIRECT" in the last 72 hours. Anemia Panel: No results for input(s): "VITAMINB12", "FOLATE", "FERRITIN", "TIBC", "IRON", "RETICCTPCT" in the last 72 hours. Urine analysis:    Component Value Date/Time   COLORURINE YELLOW 06/01/2022 0129   APPEARANCEUR CLEAR 06/01/2022 0129   LABSPEC <=1.005 06/01/2022 0129   PHURINE 5.5 06/01/2022 0129   GLUCOSEU NEGATIVE 06/01/2022 0129   HGBUR NEGATIVE 06/01/2022 0129   BILIRUBINUR NEGATIVE 06/01/2022 0129   BILIRUBINUR neg  03/25/2020 1517   KETONESUR NEGATIVE 06/01/2022 0129   PROTEINUR NEGATIVE 06/01/2022 0129   UROBILINOGEN 0.2 03/25/2020 1517   UROBILINOGEN 0.2 11/28/2014 0735   NITRITE NEGATIVE 06/01/2022 0129   LEUKOCYTESUR NEGATIVE 06/01/2022 0129   Sepsis Labs: Invalid input(s): "PROCALCITONIN", "LACTICIDVEN"  Microbiology: No results found for this or any previous visit (from the past 240 hour(s)).  Radiology Studies: No results found.    Rhyker Silversmith T. [place]  If 7PM-7AM, please contact night-coverage [email or web address] 06/03/2022, 3:22 PM

## 2022-06-03 NOTE — Progress Notes (Signed)
Pharmacy Antibiotic Note  Morgan Cline is a 51 y.o. female admitted on 05/31/2022 with  diverticulitis with microperforation .  Pharmacy has been consulted for piperacillin/tazobactam dosing.  Today, 06/03/22 WBC slightly elevated SCr WNL Afebrile  Plan: Discontinue ceftriaxone and metronidazole Start piperacillin/tazobactam 3.375 g IV q8h EI  Renal function stable, do not anticipate any dose adjustments. Pharmacy to sign off. Please re-consult if needed.   Height: '5\' 4"'$  (162.6 cm) Weight: 115.3 kg (254 lb 3.1 oz) IBW/kg (Calculated) : 54.7  Temp (24hrs), Avg:98.7 F (37.1 C), Min:98.6 F (37 C), Max:98.8 F (37.1 C)  Recent Labs  Lab 05/29/22 0601 05/31/22 2224 06/01/22 0450 06/02/22 0428 06/03/22 0449  WBC 8.0 12.2* 13.5* 12.6* 13.9*  CREATININE 0.92 0.90 0.80 0.84 0.77    Estimated Creatinine Clearance: 107.1 mL/min (by C-G formula based on SCr of 0.77 mg/dL).    Allergies  Allergen Reactions   Codeine Hives and Itching   Pineapple Itching and Swelling    Lip swelling and itching    Antimicrobials this admission: ceftriaxone 6/26 >> 6/27 metronidazole 6/26 >> 6/28 Piperacillin/tazobactam 6/28 >>  Dose adjustments this admission:  Microbiology results: None  Morgan Cline, PharmD 06/03/2022 9:00 AM

## 2022-06-03 NOTE — Progress Notes (Signed)
Patient friend came to the hallway to asked for help and stated that patient in having a severe sharp pain on her abdomen , this RN was assisting another patient to walk in the hallway when the [facility] was also the floor at the same time. AC took over and went to check the patient, [facility] then explained to me about having unpleasant conversation with patient's family member on the phone. This RN notified on call provider about patient having constant pain even after pain medicine and NT was send to checks patient vital signs. Provider aware and new orders has been made. Patient was given another dose of pain medicine.  We will continue to monitor.

## 2022-06-03 NOTE — Progress Notes (Signed)
Patient was assessed after the last pain medicine and verbalizes " the pain comes and goes and I'm having spasms on the left side can I have something for spasms?" On call provider notified new orders made and was carried out, anti spasm medicine given to patient.We will continue to monitor and evaluate.

## 2022-06-03 NOTE — Progress Notes (Signed)
NT called in my attention about patient having a severe while sitting in the toilet, upon entering the room patient was found in the toilet sitting and when asked she she can get up she said she can't because she is hurting so much, patient was then assisted back I her bed. PRN IV pain medicine was given and was made aware that the RN will evaluate her after 30 minutes. We will continue to monitor.

## 2022-06-03 NOTE — Progress Notes (Signed)
Subjective: CC: Reports no abdominal pain at rest but still having some llq pain with movement or when having bm's. Nausea worsening with little po intake as this also seems to make this worse. No vomiting. Passing flatus and had a few episodes of diarrhea yesterday.   Objective: Vital signs in last 24 hours: Temp:  [98.6 F (37 C)-98.8 F (37.1 C)] 98.8 F (37.1 C) (06/28 0531) Pulse Rate:  [103-110] 103 (06/28 0531) Resp:  [18] 18 (06/28 0531) BP: (104-121)/(69-96) 121/96 (06/28 0531) SpO2:  [96 %-97 %] 96 % (06/28 0531) Last BM Date : 06/02/22  Intake/Output from previous day: 06/27 0701 - 06/28 0700 In: 1707.2 [P.O.:1200; I.V.:272.2; IV Piggyback:235.1] Out: 1603 [Urine:1600; Stool:3] Intake/Output this shift: No intake/output data recorded.  PE: Gen:  Alert, NAD, pleasant Abd: Soft, obese, ND, suprapubic and LLQ ttp without rigidity or guarding that is stable from yesterday. +BS.   Lab Results:  Recent Labs    06/02/22 0428 06/03/22 0449  WBC 12.6* 13.9*  HGB 11.6* 13.1  HCT 36.0 39.4  PLT 300 358   BMET Recent Labs    06/02/22 0428 06/03/22 0449  NA 140 140  K 3.7 3.5  CL 109 108  CO2 24 23  GLUCOSE 110* 123*  BUN 8 6  CREATININE 0.84 0.77  CALCIUM 8.2* 8.5*   PT/INR No results for input(s): "LABPROT", "INR" in the last 72 hours. CMP     Component Value Date/Time   NA 140 06/03/2022 0449   K 3.5 06/03/2022 0449   CL 108 06/03/2022 0449   CO2 23 06/03/2022 0449   GLUCOSE 123 (H) 06/03/2022 0449   BUN 6 06/03/2022 0449   CREATININE 0.77 06/03/2022 0449   CALCIUM 8.5 (L) 06/03/2022 0449   PROT 6.7 06/01/2022 0450   ALBUMIN 2.9 (L) 06/03/2022 0449   AST 12 (L) 06/01/2022 0450   ALT 13 06/01/2022 0450   ALKPHOS 70 06/01/2022 0450   BILITOT 0.7 06/01/2022 0450   GFRNONAA >60 06/03/2022 0449   GFRAA >90 11/16/2011 1455   Lipase     Component Value Date/Time   LIPASE 23 05/31/2022 2224    Studies/Results: DG Abd Portable  1V  Result Date: 06/01/2022 CLINICAL DATA:  Abdominal pain EXAM: PORTABLE ABDOMEN - 1 VIEW COMPARISON:  None Available. FINDINGS: Examination is technically difficult due to the patient's body habitus. As far as seen bowel gas pattern is nonspecific. No abnormal masses or calcifications are seen. IUD is seen in the pelvis. Degenerative changes are noted in the lumbar spine. IMPRESSION: Nonspecific bowel gas pattern.  Lumbar spondylosis. Electronically Signed   By: Elmer Picker M.D.   On: 06/01/2022 11:36    Anti-infectives: Anti-infectives (From admission, onward)    Start     Dose/Rate Route Frequency Ordered Stop   06/02/22 0000  cefTRIAXone (ROCEPHIN) 2 g in sodium chloride 0.9 % 100 mL IVPB        2 g 200 mL/hr over 30 Minutes Intravenous Every 24 hours 06/01/22 1302 06/08/22 2359   06/01/22 1400  metroNIDAZOLE (FLAGYL) IVPB 500 mg        500 mg 100 mL/hr over 60 Minutes Intravenous Every 12 hours 06/01/22 1302 06/08/22 1359   06/01/22 1200  cefTRIAXone (ROCEPHIN) 2 g in sodium chloride 0.9 % 100 mL IVPB  Status:  Discontinued        2 g 200 mL/hr over 30 Minutes Intravenous Every 24 hours 06/01/22 0432 06/01/22 0801   06/01/22  0900  Ampicillin-Sulbactam (UNASYN) 3 g in sodium chloride 0.9 % 100 mL IVPB  Status:  Discontinued        3 g 200 mL/hr over 30 Minutes Intravenous Every 6 hours 06/01/22 0801 06/01/22 1302   06/01/22 0030  cefTRIAXone (ROCEPHIN) 1 g in sodium chloride 0.9 % 100 mL IVPB        1 g 200 mL/hr over 30 Minutes Intravenous  Once 06/01/22 0027 06/01/22 0155   06/01/22 0030  metroNIDAZOLE (FLAGYL) IVPB 500 mg  Status:  Discontinued        500 mg 100 mL/hr over 60 Minutes Intravenous Every 12 hours 06/01/22 0027 06/01/22 0801        Assessment/Plan Sigmoid Diverticulitis with mircoperf - No drainable fluid collection noted on CT - No indication for emergency surgery - Afebrile. Still with some tachycardia (103-110 this am). BP ok. WBC back up slightly  from 12.6 > 13.9. Exam stable with some LLQ ttp still. Having some bowel function. Would leave on CLD this morning. - Cont IV abx. Change Rocephin/Flagyl to Zosyn to see if this helps with nausea and also with increasing wbc - Repeat labs in am - Hopefully the patient will improve with conservative management and be able to avoid surgery during admission.  If she was we would recommend colonoscopy 6-8 weeks after discharge. We will follow with you   FEN - CLD, IVF VTE - SCDs, okay for chemical ppx from our standpoint ID - Rocephin/Flagyl   I reviewed nursing notes, hospitalist notes, last 24 h vitals and pain scores, last 48 h intake and output, last 24 h labs and trends, and last 24 h imaging results.   LOS: 2 days    Jillyn Ledger , [facility] Surgery 06/03/2022, 8:30 AM Please see Amion for pager number during day hours 7:00am-4:30pm

## 2022-06-04 ENCOUNTER — Inpatient Hospital Stay (HOSPITAL_COMMUNITY): Payer: Federal, State, Local not specified - PPO

## 2022-06-04 DIAGNOSIS — A09 Infectious gastroenteritis and colitis, unspecified: Secondary | ICD-10-CM | POA: Diagnosis not present

## 2022-06-04 DIAGNOSIS — R739 Hyperglycemia, unspecified: Secondary | ICD-10-CM | POA: Diagnosis not present

## 2022-06-04 DIAGNOSIS — K5720 Diverticulitis of large intestine with perforation and abscess without bleeding: Secondary | ICD-10-CM | POA: Diagnosis not present

## 2022-06-04 DIAGNOSIS — E871 Hypo-osmolality and hyponatremia: Secondary | ICD-10-CM | POA: Diagnosis not present

## 2022-06-04 LAB — LACTIC ACID, PLASMA
Lactic Acid, Venous: 1.8 mmol/L (ref 0.5–1.9)
Lactic Acid, Venous: 1.7 mmol/L (ref 0.5–1.9)

## 2022-06-04 LAB — MAGNESIUM: Magnesium: 1.9 mg/dL (ref 1.7–2.4)

## 2022-06-04 LAB — GLUCOSE, CAPILLARY
Glucose-Capillary: 139 mg/dL — ABNORMAL HIGH (ref 70–99)
Glucose-Capillary: 171 mg/dL — ABNORMAL HIGH (ref 70–99)
Glucose-Capillary: 178 mg/dL — ABNORMAL HIGH (ref 70–99)

## 2022-06-04 LAB — RENAL FUNCTION PANEL
Albumin: 2.5 g/dL — ABNORMAL LOW (ref 3.5–5.0)
Anion gap: 9 (ref 5–15)
BUN: 8 mg/dL (ref 6–20)
CO2: 22 mmol/L (ref 22–32)
Calcium: 8.2 mg/dL — ABNORMAL LOW (ref 8.9–10.3)
Chloride: 108 mmol/L (ref 98–111)
Creatinine, Ser: 0.94 mg/dL (ref 0.44–1.00)
GFR, Estimated: >60 mL/min (ref >60)
Glucose, Bld: 157 mg/dL — ABNORMAL HIGH (ref 70–99)
Phosphorus: 4.1 mg/dL (ref 2.5–4.6)
Potassium: 3.9 mmol/L (ref 3.5–5.1)
Sodium: 139 mmol/L (ref 135–145)

## 2022-06-04 LAB — CBC
HCT: 47.4 % — ABNORMAL HIGH (ref 36.0–46.0)
Hemoglobin: 15.2 g/dL — ABNORMAL HIGH (ref 12.0–15.0)
MCH: 28.6 pg (ref 26.0–34.0)
MCHC: 32.1 g/dL (ref 30.0–36.0)
MCV: 89.1 fL (ref 80.0–100.0)
Platelets: 466 10*3/uL — ABNORMAL HIGH (ref 150–400)
RBC: 5.32 MIL/uL — ABNORMAL HIGH (ref 3.87–5.11)
RDW: 14.3 % (ref 11.5–15.5)
WBC: 17.4 10*3/uL — ABNORMAL HIGH (ref 4.0–10.5)
nRBC: 0 % (ref 0.0–0.2)

## 2022-06-04 MED ORDER — IOHEXOL 9 MG/ML PO SOLN
ORAL | Status: AC
  Filled 2022-06-04: qty 1000

## 2022-06-04 MED ORDER — LACTATED RINGERS IV SOLN: INTRAVENOUS | Status: DC

## 2022-06-04 MED ORDER — PROCHLORPERAZINE EDISYLATE 10 MG/2ML IJ SOLN
5.0000 mg | Freq: Four times a day (QID) | INTRAMUSCULAR | Status: DC | PRN
  Administered 2022-06-04: 5 mg via INTRAVENOUS
  Filled 2022-06-04: qty 2

## 2022-06-04 MED ORDER — IOHEXOL 300 MG/ML  SOLN
100.0000 mL | Freq: Once | INTRAMUSCULAR | Status: AC | PRN
  Administered 2022-06-04: 100 mL via INTRAVENOUS

## 2022-06-04 MED ORDER — HYDROMORPHONE HCL 1 MG/ML IJ SOLN
0.5000 mg | INTRAMUSCULAR | Status: DC | PRN
  Administered 2022-06-04: 1 mg via INTRAVENOUS
  Filled 2022-06-04: qty 1

## 2022-06-04 MED ORDER — LACTATED RINGERS IV BOLUS
1000.0000 mL | Freq: Once | INTRAVENOUS | Status: AC
  Administered 2022-06-04: 1000 mL via INTRAVENOUS

## 2022-06-04 MED ORDER — IOHEXOL 9 MG/ML PO SOLN
500.0000 mL | ORAL | Status: AC
  Administered 2022-06-04 (×2): 500 mL via ORAL

## 2022-06-04 MED ORDER — SODIUM CHLORIDE 0.9 % IV SOLN: 12.5000 mg | Freq: Four times a day (QID) | INTRAVENOUS | Status: DC | PRN

## 2022-06-04 NOTE — Progress Notes (Signed)
PROGRESS NOTE  Morgan Cline HKV:[id number] DOB: [birth year]   PCP: [facility] [place]  Patient is from: Home.  DOA: 05/31/2022 LOS: 3  Chief complaints Chief Complaint  Patient presents with   Abdominal Pain     Brief Narrative / Interim history: 51 year old F with PMH of morbid obesity, prediabetes and migraine headache returning with worsening abdominal pain due to acute sigmoid diverticulitis despite oral antibiotics.  Patient was seen in ED 2 days prior and diagnosed with sigmoid diverticulitis for which she was discharged on p.o. Augmentin.  However, she continued to have worsening abdominal pain and return for further evaluation.  In ED, vitals stable.  CMP with mild hyponatremia and hyperglycemia.  CBC with mild leukocytosis with left shift.  Pregnancy test and UA negative.  CT abdomen and pelvis with contrast showed increase in the degree of sigmoid diverticulitis with evidence of new micro perforations and free air, mild increase in free fluid in pelvis but no abscess.    Patient with worsening abdominal pain and leukocytosis after interval improvement despite IV Zosyn.  Repeat CT A/P with worsening inflammatory process, increased ascites, multiple small fluid pockets and slightly increased free air within the abdomen concerning for sequela of perforated diverticulitis.  There is also increased dilation of proximal small bowel loops with increased mesenteric edema in the area raising concern for developing bowel obstruction.   Subjective: Seen and examined earlier this morning.  Patient had a rough night last night due to abdominal pain.  She is currently pain-free but feels fullness in the abdomen.  She denies nausea or vomiting.  Denies UTI symptoms.  Objective: Vitals:   06/04/22 0021 06/04/22 0040 06/04/22 0546 06/04/22 1303  BP: 102/72  96/73 92/77  Pulse: (!) 109  82 (!) 110  Resp: (!) '22 19 18 18  '$ Temp: 99.5 F (37.5 C)  97.7 F (36.5 C) 98 F (36.7 C)   TempSrc: Oral  Oral Oral  SpO2: 97%  100% 100%  Weight:      Height:        Examination:  GENERAL: No apparent distress.  Nontoxic. HEENT: MMM.  Vision and hearing grossly intact.  NECK: Supple.  No apparent JVD.  RESP:  No IWOB.  Fair aeration bilaterally. CVS:  RRR. Heart sounds normal.  ABD/GI/GU: BS+. Abd soft.  Diffuse tenderness.  No rebound. MSK/EXT:  Moves extremities. No apparent deformity. No edema.  SKIN: no apparent skin lesion or wound NEURO: Awake.  Oriented appropriately.  No apparent focal neuro deficit. PSYCH: Calm. Normal affect.   Procedures:  None  Microbiology summarized: None  Assessment and plan: Principal Problem:   Perforation of sigmoid colon due to diverticulitis Active Problems:   Obesity, Class III, BMI 40-49.9 (morbid obesity) ([place])   Sigmoid diverticulitis   Diarrhea   Hyperglycemia   Hyponatremia   Hypokalemia  Sigmoid diverticulitis with microperforation colon: Worsening pain and leukocytosis despite antibiotic.  Repeat CT A/P concerning for worsening inflammatory process, increased ascites, multiple small fluid pockets and slightly increased free air within the abdomen concerning for sequela of perforated diverticulitis.  There is also increased dilation of proximal small bowel loops with increased mesenteric edema in the area raising concern for developing bowel obstruction.  She is slightly tachycardic with soft blood pressures.  There seem to be some degree of hemoconcentration on CBC. -General surgery following. -CTX and Flagyl 6/26-6/28>> IV Zosyn 6/28>>> -IV LR bolus 1000 L followed by maintenance. -Check lactic acid -Transfer to progressive care  Diarrhea: Likely  due to the above.  Seems to have subsided. -Monitor  Hyperglycemia: History of prediabetes.  She is on Ozempic at home.  A1c 5.6%.  Hyperglycemia improved.  Morbid obesity Body mass index is 43.63 kg/m. -Encourage lifestyle change to lose weight.  Hyponatremia:  Resolved.  Hypokalemia: Resolved.   DVT prophylaxis:  enoxaparin (LOVENOX) injection 40 mg Start: 06/03/22 2200 SCDs Start: 06/01/22 0431  Code Status: Full code Family Communication: None at bedside Level of care: Progressive Status is: Inpatient Remains inpatient appropriate because: Sigmoid diverticulitis with possible perforation   Final disposition: TBD Consultants:  General surgery  Sch Meds:  Scheduled Meds:  enoxaparin (LOVENOX) injection  40 mg Subcutaneous Q24H   iohexol       Continuous Infusions:  lactated ringers     lactated ringers 125 mL/hr at 06/04/22 0818   piperacillin-tazobactam (ZOSYN)  IV 3.375 g (06/04/22 1312)   PRN Meds:.alum & mag hydroxide-simeth, HYDROmorphone (DILAUDID) injection, iohexol, ondansetron (ZOFRAN) IV  Antimicrobials: Anti-infectives (From admission, onward)    Start     Dose/Rate Route Frequency Ordered Stop   06/03/22 1400  piperacillin-tazobactam (ZOSYN) IVPB 3.375 g        3.375 g 12.5 mL/hr over 240 Minutes Intravenous Every 8 hours 06/03/22 0900     06/02/22 0000  cefTRIAXone (ROCEPHIN) 2 g in sodium chloride 0.9 % 100 mL IVPB  Status:  Discontinued        2 g 200 mL/hr over 30 Minutes Intravenous Every 24 hours 06/01/22 1302 06/03/22 0856   06/01/22 1400  metroNIDAZOLE (FLAGYL) IVPB 500 mg  Status:  Discontinued        500 mg 100 mL/hr over 60 Minutes Intravenous Every 12 hours 06/01/22 1302 06/03/22 0856   06/01/22 1200  cefTRIAXone (ROCEPHIN) 2 g in sodium chloride 0.9 % 100 mL IVPB  Status:  Discontinued        2 g 200 mL/hr over 30 Minutes Intravenous Every 24 hours 06/01/22 0432 06/01/22 0801   06/01/22 0900  Ampicillin-Sulbactam (UNASYN) 3 g in sodium chloride 0.9 % 100 mL IVPB  Status:  Discontinued        3 g 200 mL/hr over 30 Minutes Intravenous Every 6 hours 06/01/22 0801 06/01/22 1302   06/01/22 0030  cefTRIAXone (ROCEPHIN) 1 g in sodium chloride 0.9 % 100 mL IVPB        1 g 200 mL/hr over 30 Minutes  Intravenous  Once 06/01/22 0027 06/01/22 0155   06/01/22 0030  metroNIDAZOLE (FLAGYL) IVPB 500 mg  Status:  Discontinued        500 mg 100 mL/hr over 60 Minutes Intravenous Every 12 hours 06/01/22 0027 06/01/22 0801        I have personally reviewed the following labs and images: CBC: Recent Labs  Lab 05/29/22 0601 05/31/22 2224 06/01/22 0450 06/02/22 0428 06/03/22 0449 06/04/22 0413  WBC 8.0 12.2* 13.5* 12.6* 13.9* 17.4*  NEUTROABS 6.4  --  12.0*  --   --   --   HGB 13.3 14.1 13.6 11.6* 13.1 15.2*  HCT 40.8 42.6 41.6 36.0 39.4 47.4*  MCV 88.3 87.7 88.7 90.2 87.8 89.1  PLT 305 321 265 300 358 466*   BMP &GFR Recent Labs  Lab 05/31/22 2224 06/01/22 0450 06/02/22 0428 06/03/22 0449 06/04/22 0413  NA 133* 137 140 140 139  K 3.7 4.2 3.7 3.5 3.9  CL 102 107 109 108 108  CO2 '23 22 24 23 22  '$ GLUCOSE 143* 116* 110* 123* 157*  BUN '10 7 8 6 8  '$ CREATININE 0.90 0.80 0.84 0.77 0.94  CALCIUM 8.4* 7.9* 8.2* 8.5* 8.2*  MG  --   --  1.9 2.1 1.9  PHOS  --   --  3.2 3.4 4.1   Estimated Creatinine Clearance: 91.2 mL/min (by C-G formula based on SCr of 0.94 mg/dL). Liver & Pancreas: Recent Labs  Lab 05/29/22 0601 05/31/22 2224 06/01/22 0450 06/02/22 0428 06/03/22 0449 06/04/22 0413  AST 13* 14* 12*  --   --   --   ALT '19 15 13  '$ --   --   --   ALKPHOS 83 83 70  --   --   --   BILITOT 0.6 0.8 0.7  --   --   --   PROT 7.4 7.6 6.7  --   --   --   ALBUMIN 3.7 3.2* 3.0* 2.6* 2.9* 2.5*   Recent Labs  Lab 05/29/22 0601 05/31/22 2224  LIPASE 36 23   No results for input(s): "AMMONIA" in the last 168 hours. Diabetic: No results for input(s): "HGBA1C" in the last 72 hours.  Recent Labs  Lab 06/02/22 2343 06/03/22 0720 06/03/22 1651 06/04/22 0001 06/04/22 0738  GLUCAP 129* 107* 114* 139* 178*   Cardiac Enzymes: No results for input(s): "CKTOTAL", "CKMB", "CKMBINDEX", "TROPONINI" in the last 168 hours. No results for input(s): "PROBNP" in the last 8760  hours. Coagulation Profile: No results for input(s): "INR", "PROTIME" in the last 168 hours. Thyroid Function Tests: No results for input(s): "TSH", "T4TOTAL", "FREET4", "T3FREE", "THYROIDAB" in the last 72 hours. Lipid Profile: No results for input(s): "CHOL", "HDL", "LDLCALC", "TRIG", "CHOLHDL", "LDLDIRECT" in the last 72 hours. Anemia Panel: No results for input(s): "VITAMINB12", "FOLATE", "FERRITIN", "TIBC", "IRON", "RETICCTPCT" in the last 72 hours. Urine analysis:    Component Value Date/Time   COLORURINE YELLOW 06/01/2022 0129   APPEARANCEUR CLEAR 06/01/2022 0129   LABSPEC <=1.005 06/01/2022 0129   PHURINE 5.5 06/01/2022 0129   GLUCOSEU NEGATIVE 06/01/2022 0129   HGBUR NEGATIVE 06/01/2022 0129   BILIRUBINUR NEGATIVE 06/01/2022 0129   BILIRUBINUR neg 03/25/2020 1517   KETONESUR NEGATIVE 06/01/2022 0129   PROTEINUR NEGATIVE 06/01/2022 0129   UROBILINOGEN 0.2 03/25/2020 1517   UROBILINOGEN 0.2 11/28/2014 0735   NITRITE NEGATIVE 06/01/2022 0129   LEUKOCYTESUR NEGATIVE 06/01/2022 0129   Sepsis Labs: Invalid input(s): "PROCALCITONIN", "LACTICIDVEN"  Microbiology: No results found for this or any previous visit (from the past 240 hour(s)).  Radiology Studies: CT ABDOMEN PELVIS W CONTRAST  Result Date: 06/04/2022 CLINICAL DATA:  Evaluate diverticulitis.  Complications suspected. EXAM: CT ABDOMEN AND PELVIS WITH CONTRAST TECHNIQUE: Multidetector CT imaging of the abdomen and pelvis was performed using the standard protocol following bolus administration of intravenous contrast. RADIATION DOSE REDUCTION: This exam was performed according to the departmental dose-optimization program which includes automated exposure control, adjustment of the mA and/or kV according to patient size and/or use of iterative reconstruction technique. CONTRAST:  175m OMNIPAQUE IOHEXOL 300 MG/ML  SOLN COMPARISON:  CT abdomen and pelvis 05/31/2022 FINDINGS: Lower chest: Compressive atelectasis at both  lower lobes. Volume loss at the lung bases has progressed since 05/31/2022. Trace pleural fluid bilaterally. Hepatobiliary: Increased perihepatic ascites. Small amount of free air along the anterior aspect of the liver. Layering sludge or stones in the gallbladder. No discrete hepatic lesion. No intrahepatic or extrahepatic biliary dilatation. Pancreas: Unremarkable. No pancreatic ductal dilatation or surrounding inflammatory changes. Spleen: Normal in size without focal abnormality. Adrenals/Urinary Tract: Normal adrenal glands. Small  hypodensity in the posterior left kidney upper pole likely represents a cyst and this not require dedicated follow-up. No hydronephrosis. No suspicious renal lesions. Urinary bladder is decompressed. Stomach/Bowel: Again noted are colonic diverticula in the proximal sigmoid colon with some inflammatory changes in this area. There may be another prominent colonic diverticulum along the left side of the colon on sequence 2 image 40 versus a small pericolonic fluid collection. Increased dilatation of small bowel loops in the left upper abdomen. These dilated loops of small bowel contain oral contrast. Distal small bowel loops are decompressed. In addition, there may be focal narrowing along the left side of the transverse colon best seen on sequence 2 image 35 and this represents an interval change. This area of colonic narrowing is adjacent to the dilated loops of small bowel. Increased mesenteric edema particularly associated with the area of small bowel dilatation. Vascular/Lymphatic: Vascular structures are unremarkable. Normal caliber of the abdominal aorta. Portal venous system is patent. There is no significant lymph node enlargement in the abdomen or pelvis. Reproductive: Again noted is a hyperdense or enhancing large structure associated with the uterus that measures 6.2 cm and likely represents a large uterine fibroid. Again noted is an IUD within the uterus. Limited evaluation  of the adnexa. Other: Markedly increased mesenteric edema throughout the abdomen. Again noted are small pockets of free air in the abdomen which have slightly increased. There are multiple small pockets of fluid in the abdomen or pelvis which have progressed. Fluid in the cul-de-sac measures 3.0 x 4.7 cm. Increased fluid in the right lower quadrant on sequence 2 image 70 and increased focus of fluid in the anterior lower abdomen on image 71. Increased fluid in the right paracolic gutter and around the liver. Question a focus of fluid with gas versus a prominent diverticulum along left colon on image 39. Increased loculated / irregular fluid in the lower left paracolic gutter. Musculoskeletal: No acute bone abnormality. IMPRESSION: 1. Worsening inflammatory process throughout the abdomen and pelvis. Increased ascites throughout the abdomen with multiple small fluid pockets. There small fluid pockets concerning for developing abscess collections throughout the abdomen and pelvis. In addition, there is slightly increased free air within the abdomen. Findings are compatible with the sequelae of perforated diverticulitis. Source of the diverticulitis is probably from the proximal sigmoid colon but less conspicuous compared to the previous CT examination. 2. Increased dilatation of proximal small bowel loops with increased mesenteric edema in this area. There is also focal narrowing of the transverse colon adjacent to the small bowel dilatation. These findings raise concern for a developing bowel obstruction. 3. Trace pleural effusions with increased atelectasis at the lung bases. 4. Gallbladder sludge and/or stones. 5. Prominent uterine fibroid. These results were called by telephone at the time of interpretation on 06/04/2022 at 12:37 pm to provider [facility] , who verbally acknowledged these results. Electronically Signed   By: Markus Daft M.D.   On: 06/04/2022 12:41      Maxmillian Carsey T. [place]  If  7PM-7AM, please contact night-coverage [email or web address] 06/04/2022, 1:39 PM

## 2022-06-04 NOTE — Progress Notes (Signed)
CT scan reviewed. Patient with worsening of her diverticulitis despite broadening abx coverage. The fluid collections do not look amenable to drainage by IR. Would recommend exploratory laparotomy, colectomy and colostomy. I discussed with the patient the anatomy and physiology of the GI tract using pictures/diagrams. The planned procedure and material risks were discussed with the patient. Risks include but are not limited to anesthesia (MI, CVA, prolonged intubation, aspiration, death), pain, bleeding,  infection, scarring, hernia, damage to surrounding structures (blood vessels/nerves/viscus/organs/ureter), ileus, post-operative abscess, leak from anastomosis (if one is made) and DVT/PE. We also discussed typical post-operative care including the possible need for rehab, HH, etc if necessary. The patient's questions were answered to their satisfaction, they voiced understanding and elected to proceed with surgery. Will have WOCN mark. NPO at midnight.   Alferd Apa, [facility] Surgery 06/04/22, 2:00 PM

## 2022-06-04 NOTE — H&P (View-Only) (Signed)
CT scan reviewed. Patient with worsening of her diverticulitis despite broadening abx coverage. The fluid collections do not look amenable to drainage by IR. Would recommend exploratory laparotomy, colectomy and colostomy. I discussed with the patient the anatomy and physiology of the GI tract using pictures/diagrams. The planned procedure and material risks were discussed with the patient. Risks include but are not limited to anesthesia (MI, CVA, prolonged intubation, aspiration, death), pain, bleeding,  infection, scarring, hernia, damage to surrounding structures (blood vessels/nerves/viscus/organs/ureter), ileus, post-operative abscess, leak from anastomosis (if one is made) and DVT/PE. We also discussed typical post-operative care including the possible need for rehab, HH, etc if necessary. The patient's questions were answered to their satisfaction, they voiced understanding and elected to proceed with surgery. Will have WOCN mark. NPO at midnight.   Alferd Apa, [facility] Surgery 06/04/22, 2:00 PM

## 2022-06-04 NOTE — Consult Note (Addendum)
[place] Nurse requested for preoperative stoma site marking  Discussed surgical procedure and stoma creation with patient. Explained role of the [facility] nurse team. Demonstrated samples of pouching options. 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 Answered patient's questions. Pt is in a large amount of pain and her mobility was limited.  Examined patient lying and sitting upright in bed, in order to place the marking in the patient's visual field, away from any creases or abdominal contour issues and within the rectus muscle.  Attempted to mark below the patient's belt line, but this was not possible since a significant crease occurs in 2 locations lower on the abd when the patient leans forward and these should be avoided if possible. Marks were placed higher then usual for this reason.    Marked for colostomy in the LUQ  __6__ cm to the left of the umbilicus and _1___FX above the umbilicus.  Marked for ileostomy in the RUQ  __6__cm to the right of the umbilicus and  ___5_ cm above the umbilicus.  Patient's abdomen cleansed with CHG wipes at site markings, allowed to air dry prior to marking. Covered mark with thin film transparent dressing to preserve mark until date of surgery.   [facility] Nurse team will follow up with patient on Monday after surgery for continued ostomy care and teaching.   Julien Girt MSN, RN, [place], [place], [place]

## 2022-06-04 NOTE — Progress Notes (Signed)
Subjective: CC: Pt had a much worse night.  She is having left sided spasms in her abdomen as well as more nausea.    Objective: Vital signs in last 24 hours: Temp:  [97.7 F (36.5 C)-99.5 F (37.5 C)] 97.7 F (36.5 C) (06/29 0546) Pulse Rate:  [82-109] 82 (06/29 0546) Resp:  [18-22] 18 (06/29 0546) BP: (96-109)/(72-79) 96/73 (06/29 0546) SpO2:  [95 %-100 %] 100 % (06/29 0546) Last BM Date : 06/03/22  Intake/Output from previous day: 06/28 0701 - 06/29 0700 In: 1420 [P.O.:1320; IV Piggyback:100] Out: 1000 [Urine:1000] Intake/Output this shift: No intake/output data recorded.  PE: Gen:  Alert, NAD, pleasant Abd: Soft, obese, ND, suprapubic and LLQ ttp without rigidity or guarding  Lab Results:  Recent Labs    06/03/22 0449 06/04/22 0413  WBC 13.9* 17.4*  HGB 13.1 15.2*  HCT 39.4 47.4*  PLT 358 466*   BMET Recent Labs    06/03/22 0449 06/04/22 0413  NA 140 139  K 3.5 3.9  CL 108 108  CO2 23 22  GLUCOSE 123* 157*  BUN 6 8  CREATININE 0.77 0.94  CALCIUM 8.5* 8.2*   PT/INR No results for input(s): "LABPROT", "INR" in the last 72 hours. CMP     Component Value Date/Time   NA 139 06/04/2022 0413   K 3.9 06/04/2022 0413   CL 108 06/04/2022 0413   CO2 22 06/04/2022 0413   GLUCOSE 157 (H) 06/04/2022 0413   BUN 8 06/04/2022 0413   CREATININE 0.94 06/04/2022 0413   CALCIUM 8.2 (L) 06/04/2022 0413   PROT 6.7 06/01/2022 0450   ALBUMIN 2.5 (L) 06/04/2022 0413   AST 12 (L) 06/01/2022 0450   ALT 13 06/01/2022 0450   ALKPHOS 70 06/01/2022 0450   BILITOT 0.7 06/01/2022 0450   GFRNONAA >60 06/04/2022 0413   GFRAA >90 11/16/2011 1455   Lipase     Component Value Date/Time   LIPASE 23 05/31/2022 2224    Studies/Results: No results found.  Anti-infectives: Anti-infectives (From admission, onward)    Start     Dose/Rate Route Frequency Ordered Stop   06/03/22 1400  piperacillin-tazobactam (ZOSYN) IVPB 3.375 g        3.375 g 12.5 mL/hr over 240  Minutes Intravenous Every 8 hours 06/03/22 0900     06/02/22 0000  cefTRIAXone (ROCEPHIN) 2 g in sodium chloride 0.9 % 100 mL IVPB  Status:  Discontinued        2 g 200 mL/hr over 30 Minutes Intravenous Every 24 hours 06/01/22 1302 06/03/22 0856   06/01/22 1400  metroNIDAZOLE (FLAGYL) IVPB 500 mg  Status:  Discontinued        500 mg 100 mL/hr over 60 Minutes Intravenous Every 12 hours 06/01/22 1302 06/03/22 0856   06/01/22 1200  cefTRIAXone (ROCEPHIN) 2 g in sodium chloride 0.9 % 100 mL IVPB  Status:  Discontinued        2 g 200 mL/hr over 30 Minutes Intravenous Every 24 hours 06/01/22 0432 06/01/22 0801   06/01/22 0900  Ampicillin-Sulbactam (UNASYN) 3 g in sodium chloride 0.9 % 100 mL IVPB  Status:  Discontinued        3 g 200 mL/hr over 30 Minutes Intravenous Every 6 hours 06/01/22 0801 06/01/22 1302   06/01/22 0030  cefTRIAXone (ROCEPHIN) 1 g in sodium chloride 0.9 % 100 mL IVPB        1 g 200 mL/hr over 30 Minutes Intravenous  Once 06/01/22 0027  06/01/22 0155   06/01/22 0030  metroNIDAZOLE (FLAGYL) IVPB 500 mg  Status:  Discontinued        500 mg 100 mL/hr over 60 Minutes Intravenous Every 12 hours 06/01/22 0027 06/01/22 0801        Assessment/Plan Sigmoid Diverticulitis with mircoperf - No drainable fluid collection noted on CT at admission - No indication for emergency surgery at this time.  - Increased leukocytosis and symptoms despite change in antibiotics. Will repeat CT today.   - Cont IV abx. Zosyn - Hopefully the patient will improve with conservative management and be able to avoid surgery during admission.  If she was we would recommend colonoscopy 6-8 weeks after discharge. We will follow with you   FEN - CLD, IVF VTE - SCDs, okay for chemical ppx from our standpoint ID - Rocephin/Flagyl   I reviewed nursing notes, hospitalist notes, last 24 h vitals and pain scores, last 48 h intake and output, last 24 h labs and trends, and last 24 h imaging results.   LOS: 3  days   Milus Height, MD FACS Surgical Oncology, General Surgery, Trauma and [place] [facility], [place] for weekday/non holidays Check [email or web address] for coverage night/weekend/holidays  Do not use SecureChat as it is not reliable for timely patient care.

## 2022-06-05 ENCOUNTER — Encounter (HOSPITAL_COMMUNITY)
Admission: EM | Disposition: A | Discharge status: Skilled Nursing Facility | Payer: Self-pay | Source: Home / Self Care | Attending: Internal Medicine

## 2022-06-05 ENCOUNTER — Encounter (HOSPITAL_COMMUNITY): Payer: Self-pay | Admitting: Internal Medicine

## 2022-06-05 ENCOUNTER — Inpatient Hospital Stay (HOSPITAL_COMMUNITY): Payer: Federal, State, Local not specified - PPO | Admitting: Anesthesiology

## 2022-06-05 ENCOUNTER — Other Ambulatory Visit: Payer: Self-pay

## 2022-06-05 DIAGNOSIS — A09 Infectious gastroenteritis and colitis, unspecified: Secondary | ICD-10-CM | POA: Diagnosis not present

## 2022-06-05 DIAGNOSIS — D75839 Thrombocytosis, unspecified: Secondary | ICD-10-CM

## 2022-06-05 DIAGNOSIS — D72825 Bandemia: Secondary | ICD-10-CM

## 2022-06-05 DIAGNOSIS — E871 Hypo-osmolality and hyponatremia: Secondary | ICD-10-CM | POA: Diagnosis not present

## 2022-06-05 DIAGNOSIS — K5720 Diverticulitis of large intestine with perforation and abscess without bleeding: Secondary | ICD-10-CM | POA: Diagnosis not present

## 2022-06-05 DIAGNOSIS — R739 Hyperglycemia, unspecified: Secondary | ICD-10-CM | POA: Diagnosis not present

## 2022-06-05 HISTORY — PX: COLECTOMY: SHX59

## 2022-06-05 LAB — CBC WITH DIFFERENTIAL/PLATELET
Abs Immature Granulocytes: 0.43 10*3/uL — ABNORMAL HIGH (ref 0.00–0.07)
Basophils Absolute: 0.1 10*3/uL (ref 0.0–0.1)
Basophils Relative: 0 %
Eosinophils Absolute: 0 10*3/uL (ref 0.0–0.5)
Eosinophils Relative: 0 %
HCT: 41.4 % (ref 36.0–46.0)
Hemoglobin: 13.7 g/dL (ref 12.0–15.0)
Immature Granulocytes: 2 %
Lymphocytes Relative: 3 %
Lymphs Abs: 0.8 10*3/uL (ref 0.7–4.0)
MCH: 29.2 pg (ref 26.0–34.0)
MCHC: 33.1 g/dL (ref 30.0–36.0)
MCV: 88.3 fL (ref 80.0–100.0)
Monocytes Absolute: 1.6 10*3/uL — ABNORMAL HIGH (ref 0.1–1.0)
Monocytes Relative: 5 %
Neutro Abs: 26.8 10*3/uL — ABNORMAL HIGH (ref 1.7–7.7)
Neutrophils Relative %: 90 %
Platelets: 441 10*3/uL — ABNORMAL HIGH (ref 150–400)
RBC: 4.69 MIL/uL (ref 3.87–5.11)
RDW: 14.6 % (ref 11.5–15.5)
WBC: 29.6 10*3/uL — ABNORMAL HIGH (ref 4.0–10.5)
nRBC: 0 % (ref 0.0–0.2)

## 2022-06-05 LAB — COMPREHENSIVE METABOLIC PANEL
ALT: 10 U/L (ref 0–44)
AST: 8 U/L — ABNORMAL LOW (ref 15–41)
Albumin: 2.4 g/dL — ABNORMAL LOW (ref 3.5–5.0)
Alkaline Phosphatase: 58 U/L (ref 38–126)
Anion gap: 9 (ref 5–15)
BUN: 17 mg/dL (ref 6–20)
CO2: 26 mmol/L (ref 22–32)
Calcium: 8.1 mg/dL — ABNORMAL LOW (ref 8.9–10.3)
Chloride: 100 mmol/L (ref 98–111)
Creatinine, Ser: 1.02 mg/dL — ABNORMAL HIGH (ref 0.44–1.00)
GFR, Estimated: >60 mL/min (ref >60)
Glucose, Bld: 143 mg/dL — ABNORMAL HIGH (ref 70–99)
Potassium: 4 mmol/L (ref 3.5–5.1)
Sodium: 135 mmol/L (ref 135–145)
Total Bilirubin: 0.8 mg/dL (ref 0.3–1.2)
Total Protein: 6.3 g/dL — ABNORMAL LOW (ref 6.5–8.1)

## 2022-06-05 LAB — POCT I-STAT, CHEM 8
BUN: 12 mg/dL (ref 6–20)
Calcium, Ion: 1.06 mmol/L — ABNORMAL LOW (ref 1.15–1.40)
Chloride: 101 mmol/L (ref 98–111)
Creatinine, Ser: 0.7 mg/dL (ref 0.44–1.00)
Glucose, Bld: 139 mg/dL — ABNORMAL HIGH (ref 70–99)
HCT: 37 % (ref 36.0–46.0)
Hemoglobin: 12.6 g/dL (ref 12.0–15.0)
Potassium: 3.8 mmol/L (ref 3.5–5.1)
Sodium: 137 mmol/L (ref 135–145)
TCO2: 24 mmol/L (ref 22–32)

## 2022-06-05 LAB — TYPE AND SCREEN
ABO/RH(D): AB POS
Antibody Screen: NEGATIVE

## 2022-06-05 LAB — PHOSPHORUS: Phosphorus: 3.3 mg/dL (ref 2.5–4.6)

## 2022-06-05 LAB — MAGNESIUM: Magnesium: 1.9 mg/dL (ref 1.7–2.4)

## 2022-06-05 LAB — GLUCOSE, CAPILLARY
Glucose-Capillary: 143 mg/dL — ABNORMAL HIGH (ref 70–99)
Glucose-Capillary: 144 mg/dL — ABNORMAL HIGH (ref 70–99)
Glucose-Capillary: 124 mg/dL — ABNORMAL HIGH (ref 70–99)

## 2022-06-05 LAB — ABO/RH: ABO/RH(D): AB POS

## 2022-06-05 SURGERY — COLECTOMY, TOTAL
Anesthesia: General

## 2022-06-05 MED ORDER — PROPOFOL 10 MG/ML IV BOLUS
INTRAVENOUS | Status: DC | PRN
  Administered 2022-06-05: 150 mg via INTRAVENOUS

## 2022-06-05 MED ORDER — SUGAMMADEX SODIUM 200 MG/2ML IV SOLN
INTRAVENOUS | Status: DC | PRN
  Administered 2022-06-05: 213.2 mg via INTRAVENOUS

## 2022-06-05 MED ORDER — DIPHENHYDRAMINE HCL 12.5 MG/5ML PO ELIX
12.5000 mg | ORAL_SOLUTION | Freq: Four times a day (QID) | ORAL | Status: DC | PRN
Start: 2022-06-05 — End: 2022-06-05

## 2022-06-05 MED ORDER — FENTANYL CITRATE (PF) 250 MCG/5ML IJ SOLN
INTRAMUSCULAR | Status: AC
  Filled 2022-06-05: qty 5

## 2022-06-05 MED ORDER — ONDANSETRON HCL 4 MG/2ML IJ SOLN
INTRAMUSCULAR | Status: AC
  Filled 2022-06-05: qty 2

## 2022-06-05 MED ORDER — ALBUMIN HUMAN 5 % IV SOLN
INTRAVENOUS | Status: AC
  Filled 2022-06-05: qty 500

## 2022-06-05 MED ORDER — OXYCODONE HCL 5 MG/5ML PO SOLN: 5.0000 mg | Freq: Once | ORAL | Status: DC | PRN

## 2022-06-05 MED ORDER — PROPOFOL 10 MG/ML IV BOLUS
INTRAVENOUS | Status: AC
  Filled 2022-06-05: qty 20

## 2022-06-05 MED ORDER — LIDOCAINE HCL (PF) 2 % IJ SOLN
INTRAMUSCULAR | Status: AC
  Filled 2022-06-05: qty 5

## 2022-06-05 MED ORDER — ROCURONIUM BROMIDE 10 MG/ML (PF) SYRINGE
PREFILLED_SYRINGE | INTRAVENOUS | Status: DC | PRN
  Administered 2022-06-05: 20 mg via INTRAVENOUS
  Administered 2022-06-05: 30 mg via INTRAVENOUS
  Administered 2022-06-05: 100 mg via INTRAVENOUS

## 2022-06-05 MED ORDER — MIDAZOLAM HCL 5 MG/5ML IJ SOLN
INTRAMUSCULAR | Status: DC | PRN
  Administered 2022-06-05: 2 mg via INTRAVENOUS

## 2022-06-05 MED ORDER — DEXAMETHASONE SODIUM PHOSPHATE 10 MG/ML IJ SOLN
INTRAMUSCULAR | Status: AC
  Filled 2022-06-05: qty 1

## 2022-06-05 MED ORDER — LACTATED RINGERS IV SOLN: INTRAVENOUS | Status: DC | PRN

## 2022-06-05 MED ORDER — MIDAZOLAM HCL 2 MG/2ML IJ SOLN
INTRAMUSCULAR | Status: AC
  Filled 2022-06-05: qty 2

## 2022-06-05 MED ORDER — OXYCODONE HCL 5 MG PO TABS: 5.0000 mg | ORAL_TABLET | Freq: Once | ORAL | Status: DC | PRN

## 2022-06-05 MED ORDER — ROCURONIUM BROMIDE 10 MG/ML (PF) SYRINGE
PREFILLED_SYRINGE | INTRAVENOUS | Status: AC
Start: 2022-06-05 — End: ?
  Filled 2022-06-05: qty 10

## 2022-06-05 MED ORDER — HYDROMORPHONE HCL 1 MG/ML IJ SOLN
INTRAMUSCULAR | Status: AC
  Filled 2022-06-05: qty 1

## 2022-06-05 MED ORDER — PHENOL 1.4 % MT LIQD
1.0000 | OROMUCOSAL | Status: DC | PRN
  Filled 2022-06-05: qty 177

## 2022-06-05 MED ORDER — LIDOCAINE 2% (20 MG/ML) 5 ML SYRINGE
INTRAMUSCULAR | Status: DC | PRN
  Administered 2022-06-05: 60 mg via INTRAVENOUS

## 2022-06-05 MED ORDER — BUPIVACAINE-EPINEPHRINE (PF) 0.5% -1:200000 IJ SOLN
INTRAMUSCULAR | Status: AC
  Filled 2022-06-05: qty 30

## 2022-06-05 MED ORDER — HYDROMORPHONE 1 MG/ML IV SOLN
INTRAVENOUS | Status: DC
  Administered 2022-06-05: 1 mg via INTRAVENOUS
  Administered 2022-06-05: 3 mg via INTRAVENOUS
  Filled 2022-06-05 (×10): qty 30

## 2022-06-05 MED ORDER — AMISULPRIDE (ANTIEMETIC) 5 MG/2ML IV SOLN: 10.0000 mg | Freq: Once | INTRAVENOUS | Status: DC | PRN

## 2022-06-05 MED ORDER — ESMOLOL HCL 100 MG/10ML IV SOLN
INTRAVENOUS | Status: AC
  Filled 2022-06-05: qty 10

## 2022-06-05 MED ORDER — HYDROMORPHONE HCL 1 MG/ML IJ SOLN
0.2500 mg | INTRAMUSCULAR | Status: DC | PRN
  Administered 2022-06-05 (×4): 0.5 mg via INTRAVENOUS

## 2022-06-05 MED ORDER — ACETAMINOPHEN 10 MG/ML IV SOLN
1000.0000 mg | Freq: Four times a day (QID) | INTRAVENOUS | Status: AC
Start: 2022-06-05 — End: 2022-06-06
  Administered 2022-06-05 – 2022-06-06 (×4): 1000 mg via INTRAVENOUS
  Filled 2022-06-05 (×4): qty 100

## 2022-06-05 MED ORDER — ALBUMIN HUMAN 5 % IV SOLN: INTRAVENOUS | Status: DC | PRN

## 2022-06-05 MED ORDER — ROCURONIUM BROMIDE 10 MG/ML (PF) SYRINGE
PREFILLED_SYRINGE | INTRAVENOUS | Status: AC
  Filled 2022-06-05: qty 10

## 2022-06-05 MED ORDER — CHLORHEXIDINE GLUCONATE CLOTH 2 % EX PADS
6.0000 | MEDICATED_PAD | Freq: Every day | CUTANEOUS | Status: DC
  Administered 2022-06-05 – 2022-06-09 (×3): 6 via TOPICAL

## 2022-06-05 MED ORDER — PHENYLEPHRINE HCL-NACL 20-0.9 MG/250ML-% IV SOLN
INTRAVENOUS | Status: DC | PRN
  Administered 2022-06-05: 35 ug/min via INTRAVENOUS

## 2022-06-05 MED ORDER — METOPROLOL TARTRATE 5 MG/5ML IV SOLN
2.5000 mg | Freq: Four times a day (QID) | INTRAVENOUS | Status: DC
Start: 2022-06-05 — End: 2022-06-11
  Administered 2022-06-05 – 2022-06-11 (×20): 2.5 mg via INTRAVENOUS
  Filled 2022-06-05 (×20): qty 5

## 2022-06-05 MED ORDER — ONDANSETRON HCL 4 MG/2ML IJ SOLN: 4.0000 mg | Freq: Once | INTRAMUSCULAR | Status: DC | PRN

## 2022-06-05 MED ORDER — NALOXONE HCL 0.4 MG/ML IJ SOLN
0.4000 mg | INTRAMUSCULAR | Status: DC | PRN
Start: 2022-06-05 — End: 2022-06-05

## 2022-06-05 MED ORDER — HYDROMORPHONE 1 MG/ML IV SOLN
INTRAVENOUS | Status: DC
  Administered 2022-06-06: 0.75 mg via INTRAVENOUS
  Administered 2022-06-06: 2.25 mg via INTRAVENOUS
  Administered 2022-06-06: 1.25 mg via INTRAVENOUS
  Administered 2022-06-06: 0.25 mg via INTRAVENOUS
  Administered 2022-06-06: 3.25 mg via INTRAVENOUS
  Administered 2022-06-06: 1.5 mg via INTRAVENOUS
  Administered 2022-06-07: 1.25 mg via INTRAVENOUS
  Administered 2022-06-07 (×2): 0.75 mg via INTRAVENOUS
  Administered 2022-06-07: 3.5 mg via INTRAVENOUS
  Administered 2022-06-07: 1.5 mg via INTRAVENOUS
  Administered 2022-06-08: 5.06 mg via INTRAVENOUS
  Administered 2022-06-08: 2 mg via INTRAVENOUS
  Administered 2022-06-09: 0.5 mg via INTRAVENOUS
  Administered 2022-06-09: 1.5 mg via INTRAVENOUS
  Administered 2022-06-09: 1 mg via INTRAVENOUS
  Administered 2022-06-09: 2.25 mg via INTRAVENOUS
  Administered 2022-06-10: 1.25 mg via INTRAVENOUS
  Administered 2022-06-10: 1 mg via INTRAVENOUS
  Administered 2022-06-10: 1.75 mg via INTRAVENOUS
  Administered 2022-06-10: 0.75 mg via INTRAVENOUS
  Administered 2022-06-10: 2.25 mg via INTRAVENOUS
  Administered 2022-06-11: 2 mg via INTRAVENOUS
  Administered 2022-06-11: 1.25 mg via INTRAVENOUS
  Administered 2022-06-11 (×2): 1.5 mg via INTRAVENOUS
  Administered 2022-06-11: 30 mg via INTRAVENOUS
  Administered 2022-06-12 (×2): 1 mg via INTRAVENOUS
  Administered 2022-06-12: 2.75 mg via INTRAVENOUS
  Administered 2022-06-12: 1 mg via INTRAVENOUS
  Administered 2022-06-12: 2.25 mg via INTRAVENOUS
  Administered 2022-06-12: 1 mg via INTRAVENOUS
  Administered 2022-06-13 (×2): 0.5 mg via INTRAVENOUS
  Administered 2022-06-13: 0.25 mg via INTRAVENOUS
  Administered 2022-06-13: 1.75 mg via INTRAVENOUS
  Filled 2022-06-05 (×2): qty 30

## 2022-06-05 MED ORDER — DEXAMETHASONE SODIUM PHOSPHATE 10 MG/ML IJ SOLN
INTRAMUSCULAR | Status: DC | PRN
  Administered 2022-06-05: 10 mg via INTRAVENOUS

## 2022-06-05 MED ORDER — LIDOCAINE HCL (PF) 1 % IJ SOLN
INTRAMUSCULAR | Status: AC
  Filled 2022-06-05: qty 30

## 2022-06-05 MED ORDER — METHOCARBAMOL 1000 MG/10ML IJ SOLN
500.0000 mg | Freq: Four times a day (QID) | INTRAVENOUS | Status: DC | PRN
  Administered 2022-06-14: 500 mg via INTRAVENOUS
  Filled 2022-06-05: qty 500

## 2022-06-05 MED ORDER — FENTANYL CITRATE (PF) 100 MCG/2ML IJ SOLN
INTRAMUSCULAR | Status: DC | PRN
  Administered 2022-06-05 (×3): 50 ug via INTRAVENOUS
  Administered 2022-06-05: 100 ug via INTRAVENOUS
  Administered 2022-06-05 (×3): 50 ug via INTRAVENOUS

## 2022-06-05 MED ORDER — SODIUM CHLORIDE 0.9% FLUSH: 9.0000 mL | INTRAVENOUS | Status: DC | PRN

## 2022-06-05 MED ORDER — ONDANSETRON HCL 4 MG/2ML IJ SOLN: 4.0000 mg | Freq: Four times a day (QID) | INTRAMUSCULAR | Status: DC | PRN

## 2022-06-05 MED ORDER — PHENYLEPHRINE HCL (PRESSORS) 10 MG/ML IV SOLN
INTRAVENOUS | Status: DC | PRN
  Administered 2022-06-05: 80 ug via INTRAVENOUS

## 2022-06-05 MED ORDER — 0.9 % SODIUM CHLORIDE (POUR BTL) OPTIME
TOPICAL | Status: DC | PRN
  Administered 2022-06-05: 2000 mL

## 2022-06-05 MED ORDER — DIPHENHYDRAMINE HCL 50 MG/ML IJ SOLN: 12.5000 mg | Freq: Four times a day (QID) | INTRAMUSCULAR | Status: DC | PRN

## 2022-06-05 SURGICAL SUPPLY — 76 items
BAG COUNTER SPONGE SURGICOUNT (BAG) ×1 IMPLANT
BAG SPNG CNTER NS LX DISP (BAG) ×1
BLADE EXTENDED COATED 6.5IN (ELECTRODE) ×3 IMPLANT
BLADE HEX COATED 2.75 (ELECTRODE) ×6 IMPLANT
BLADE SURG SZ10 CARB STEEL (BLADE) ×3 IMPLANT
BNDG GAUZE DERMACEA FLUFF (GAUZE/BANDAGES/DRESSINGS) ×1
BNDG GAUZE DERMACEA FLUFF 4 (GAUZE/BANDAGES/DRESSINGS) IMPLANT
BNDG GZE DERMACEA 4 6PLY (GAUZE/BANDAGES/DRESSINGS) ×1
CELLS DAT CNTRL 66122 CELL SVR (MISCELLANEOUS) IMPLANT
COUNTER NEEDLE 20 DBL MAG RED (NEEDLE) ×3 IMPLANT
COVER MAYO STAND STRL (DRAPES) ×5 IMPLANT
COVER SURGICAL LIGHT HANDLE (MISCELLANEOUS) ×3 IMPLANT
DRAIN CHANNEL 19F RND (DRAIN) IMPLANT
DRAPE LAPAROSCOPIC ABDOMINAL (DRAPES) ×3 IMPLANT
DRAPE SHEET LG 3/4 BI-LAMINATE (DRAPES) ×3 IMPLANT
DRAPE UTILITY XL STRL (DRAPES) ×6 IMPLANT
DRAPE WARM FLUID 44X44 (DRAPES) ×3 IMPLANT
DRSG OPSITE POSTOP 4X10 (GAUZE/BANDAGES/DRESSINGS) IMPLANT
DRSG OPSITE POSTOP 4X6 (GAUZE/BANDAGES/DRESSINGS) IMPLANT
DRSG OPSITE POSTOP 4X8 (GAUZE/BANDAGES/DRESSINGS) IMPLANT
DRSG PAD ABDOMINAL 8X10 ST (GAUZE/BANDAGES/DRESSINGS) ×1 IMPLANT
DRSG TEGADERM 4X4.75 (GAUZE/BANDAGES/DRESSINGS) IMPLANT
ELECT REM PT RETURN 15FT ADLT (MISCELLANEOUS) ×3 IMPLANT
ENDOLOOP SUT PDS II  0 18 (SUTURE)
ENDOLOOP SUT PDS II 0 18 (SUTURE) IMPLANT
GAUZE SPONGE 4X4 12PLY STRL (GAUZE/BANDAGES/DRESSINGS) ×2 IMPLANT
GLOVE BIO SURGEON STRL SZ 6 (GLOVE) ×6 IMPLANT
GLOVE INDICATOR 6.5 STRL GRN (GLOVE) ×6 IMPLANT
GOWN SPEC L3 XXLG W/TWL (GOWN DISPOSABLE) IMPLANT
GOWN STRL REUS W/ TWL XL LVL3 (GOWN DISPOSABLE) ×6 IMPLANT
GOWN STRL REUS W/TWL XL LVL3 (GOWN DISPOSABLE) ×8
HANDLE SUCTION POOLE (INSTRUMENTS) ×2 IMPLANT
KIT BASIN OR (CUSTOM PROCEDURE TRAY) ×3 IMPLANT
KIT TURNOVER KIT A (KITS) ×1 IMPLANT
LEGGING LITHOTOMY PAIR STRL (DRAPES) ×2 IMPLANT
LIGASURE IMPACT 36 18CM CVD LR (INSTRUMENTS) ×3 IMPLANT
PACK GENERAL/GYN (CUSTOM PROCEDURE TRAY) ×3 IMPLANT
POUCH OSTOMY FLEX CONVEX 2-1/8 (OSTOMY) ×1 IMPLANT
RELOAD BL CONTOUR (ENDOMECHANICALS) ×2 IMPLANT
RELOAD STAPLE 40 BLU REG (ENDOMECHANICALS) IMPLANT
RETRACTOR WND ALEXIS 18 MED (MISCELLANEOUS) IMPLANT
RTRCTR WOUND ALEXIS 18CM MED (MISCELLANEOUS)
SCISSORS LAP 5X35 DISP (ENDOMECHANICALS) ×3 IMPLANT
SLEEVE SURGEON STRL (DRAPES) IMPLANT
SOL PREP POV-IOD 4OZ 10% (MISCELLANEOUS) ×1 IMPLANT
SPIKE FLUID TRANSFER (MISCELLANEOUS) ×2 IMPLANT
SPONGE T-LAP 18X18 ~~LOC~~+RFID (SPONGE) ×3 IMPLANT
STAPLER CVD CUT BL 40 RELOAD (ENDOMECHANICALS) ×2 IMPLANT
STAPLER CVD CUT BLU 40 RELOAD (ENDOMECHANICALS) IMPLANT
STAPLER PROXIMATE 75MM BLUE (STAPLE) ×1 IMPLANT
STAPLER VISISTAT 35W (STAPLE) ×3 IMPLANT
SUCTION POOLE HANDLE (INSTRUMENTS) ×2
SURGILUBE 2OZ TUBE FLIPTOP (MISCELLANEOUS) IMPLANT
SUT MNCRL AB 4-0 PS2 18 (SUTURE) ×3 IMPLANT
SUT PDS AB 1 TP1 96 (SUTURE) IMPLANT
SUT PROLENE 2 0 KS (SUTURE) IMPLANT
SUT PROLENE 2 0 SH DA (SUTURE) ×1 IMPLANT
SUT SILK 2 0 (SUTURE)
SUT SILK 2 0 SH CR/8 (SUTURE) IMPLANT
SUT SILK 2-0 18XBRD TIE 12 (SUTURE) IMPLANT
SUT SILK 3 0 (SUTURE)
SUT SILK 3 0 SH CR/8 (SUTURE) IMPLANT
SUT SILK 3-0 18XBRD TIE 12 (SUTURE) IMPLANT
SUT VIC AB 2-0 SH 18 (SUTURE) ×3 IMPLANT
SUT VIC AB 3-0 SH 18 (SUTURE) ×3 IMPLANT
SUT VIC AB 3-0 SH 8-18 (SUTURE) ×1 IMPLANT
SUT VICRYL 2 0 18  UND BR (SUTURE) ×2
SUT VICRYL 2 0 18 UND BR (SUTURE) ×2 IMPLANT
SUT VICRYL 3 0 BR 18  UND (SUTURE) ×2
SUT VICRYL 3 0 BR 18 UND (SUTURE) ×2 IMPLANT
SYR BULB IRRIG 60ML STRL (SYRINGE) ×3 IMPLANT
TOWEL OR 17X26 10 PK STRL BLUE (TOWEL DISPOSABLE) ×6 IMPLANT
TOWEL OR NON WOVEN STRL DISP B (DISPOSABLE) ×6 IMPLANT
TRAY FOLEY MTR SLVR 14FR STAT (SET/KITS/TRAYS/PACK) ×1 IMPLANT
TRAY FOLEY MTR SLVR 16FR STAT (SET/KITS/TRAYS/PACK) ×2 IMPLANT
TUBING CONNECTING 10 (TUBING) IMPLANT

## 2022-06-05 NOTE — Op Note (Signed)
PREOPERATIVE DIAGNOSIS: Perforated diverticulitis with multiple abscesses  POSTOPERATIVE DIAGNOSIS: Perforated diverticulitis.   PROCEDURE PERFORMED: Sigmoid colectomy and end colostomy (Hartmann's procedure), takedown of splenic flexure  SURGEON: Stark Klein, MD   ASSISTANT: Annye English, MD Judyann Munson, RNFA  ANESTHESIA: General.   FINDINGS: descending colon diverticulitis with abscesses.  Most severe spot in distal descending colon   ESTIMATED BLOOD LOSS: 200 mL.   COMPLICATIONS: None known.   PROCEDURE:  Patient was identified in the holding area and taken to  the operating room where she was placed supine on the operating room  table. General anesthesia was induced. Foley catheter was placed.  The abdomen was prepped and draped in a sterile fashion. Time-out was performed according to the  surgical safety check list. When all was correct we continued.   A midline incision was made and the subcutaneous tissues were divided with a Bovie electrocautery. The fascia was opened in the midline. The omentum was dark and had significant inflammatory changes.  One small interloop abscess was identified while freeing up the small bowel.    Purulent fluid was aspirated from the abdomen. The sigmoid colon perforation was not immediately visible. The small bowel adhesions were loose and were able to be gently finger fractured without trauma to the bowel.  The omentum was taken down off the left colon in order to have better visualization.  This was freed up and the small bowel was packed into the right abdomen.  The distal sigmoid/proximal rectum was identified and was clear of inflammation.  This was isolated and divided with a contour stapler.    The bookwalter was set up to assist with visualization.  The white line of Toldt of the descending colon was taken down with the Bovie electrocautery proximally along the descending colon. The Ligasure was used to divide the mesocolon high up  near the colon wall going distally.  The entire distal colon was inflamed.  The omentum was taken off the transverse colon, and the splenic flexure was taken down.  This was a very difficult dissection due to how far posterior the colon was located. Care was taken to avoid injuring the transverse colon and splenic flexure.  Once this was freed up, the colon was divided right at the splenic flexure.  The mesentery was taken down with the ligasure.    The transverse colon was freed up a bit attention was directed to the left abdomen for a location for an ostomy. Kochers were used to pull the fascia to the midline and an  another Fransisca Kaufmann was used to elevate the skin to create a circular site.  A divot of fatty tissue was taken as well. The fascia was opened in a  cruciate fashion separating the longitudinal fibers of the rectus. A  lap was placed behind the fascia to ensure that the Bovie could not go  through and puncture any of the intra-abdominal organs. A Kary Kos was  then advanced through the abdominal wall and used to retract the stump  of the descending colon through the abdominal wall.   The abdomen was then closed using running #1 looped PDS sutures. The wound  was then irrigated and packed with moist betadine soaked Kerlix. The umbilical skin was reapproximated.  The colon was then opened with the Bovie.  3-0 Vicryl interrupted sutures were used to create the ostomy. This was then dressed with a wafer.   The patient was allowed to emerge from anesthesia and taken to the PACU in stable  condition.  Needle, sponge, and instrument counts were correct x 2.

## 2022-06-05 NOTE — Progress Notes (Signed)
PROGRESS NOTE  Morgan Cline FBP:[id number] DOB: 1971-02-28   PCP: [facility] [place]  Patient is from: Home.  DOA: 05/31/2022 LOS: 4  Chief complaints Chief Complaint  Patient presents with   Abdominal Pain     Brief Narrative / Interim history: 51 year old F with PMH of morbid obesity, prediabetes and migraine headache returning with worsening abdominal pain due to acute sigmoid diverticulitis despite oral antibiotics.  Patient was seen in ED 2 days prior and diagnosed with sigmoid diverticulitis for which she was discharged on p.o. Augmentin.  However, she continued to have worsening abdominal pain and return for further evaluation.  In ED, vitals stable.  CMP with mild hyponatremia and hyperglycemia.  CBC with mild leukocytosis with left shift.  Pregnancy test and UA negative.  CT abdomen and pelvis with contrast showed increase in the degree of sigmoid diverticulitis with evidence of new micro perforations and free air, mild increase in free fluid in pelvis but no abscess.    Patient with worsening abdominal pain and leukocytosis after interval improvement despite IV Zosyn.  Repeat CT A/P with worsening inflammatory process, increased ascites, multiple small fluid pockets and slightly increased free air within the abdomen concerning for sequela of perforated diverticulitis.  There is also increased dilation of proximal small bowel loops with increased mesenteric edema in the area raising concern for developing bowel obstruction.  Patient to go for Hartman's procedure today (06/05/2022.   Subjective: Seen and examined earlier this morning before she went for surgery.  No major events overnight of this morning.  Significant rising leukocytosis to 30.  Surprisingly, she feels better today.  Still with some nausea.  Abdominal pain improved.  She states she had some watery stool last night.  Objective: Vitals:   06/05/22 0800 06/05/22 0900 06/05/22 1000 06/05/22 1100  BP:       Pulse:      Resp: (!) 22 (!) 23 (!) 22 (!) 23  Temp:      TempSrc:      SpO2:      Weight:      Height:        Examination:  GENERAL: No apparent distress.  Nontoxic. HEENT: MMM.  Vision and hearing grossly intact.  NECK: Supple.  No apparent JVD.  RESP:  No IWOB.  Fair aeration bilaterally. CVS:  RRR. Heart sounds normal.  ABD/GI/GU: BS+. Abd soft, NTND.  MSK/EXT:  Moves extremities. No apparent deformity. No edema.  SKIN: no apparent skin lesion or wound NEURO: Awake and alert. Oriented appropriately.  No apparent focal neuro deficit. PSYCH: Calm. Normal affect.   Procedures:  None  Microbiology summarized: None  Assessment and plan: Principal Problem:   Perforation of sigmoid colon due to diverticulitis Active Problems:   Obesity, Class III, BMI 40-49.9 (morbid obesity) ([place])   Sigmoid diverticulitis   Diarrhea   Hyperglycemia   Hyponatremia   Hypokalemia  Sigmoid diverticulitis with microperforation colon: Worsening pain and leukocytosis despite antibiotic.  Repeat CT A/P concerning worsening sequela of perforated diverticulitis with small pockets of fluid, and increased dilation of proximal small bowel loops with increased mesenteric edema in the area raising concern for developing bowel obstruction.  Leukocytosis worse.  Lactic acid normal. -General surgery following. -CTX and Flagyl 6/26-6/28>> IV Zosyn 6/28>>> -Plan for Hartmann procedure today (6/30). -N.p.o., IV fluid, antiemetics and pain control  Diarrhea: Reports ongoing small liquidy stool but not large volume.  Likely due to the above.  Hyperglycemia: History of prediabetes.  She is on  Ozempic at home.  A1c 5.6%.  Hyperglycemia improved.  Hyponatremia: Resolved.  Hypokalemia: Resolved.  Leukocytosis/bandemia/thrombocytosis: Likely due to #1. -Antibiotic as above -Monitor  Morbid obesity Body mass index is 43.63 kg/m. -Encourage lifestyle change to lose weight.     DVT prophylaxis:   enoxaparin (LOVENOX) injection 40 mg Start: 06/03/22 2200 SCDs Start: 06/01/22 0431  Code Status: Full code Family Communication: Updated family member at bedside. Level of care: Progressive Status is: Inpatient Remains inpatient appropriate because: Perforated sigmoid diverticulitis   Final disposition: TBD Consultants:  General surgery  Sch Meds:  Scheduled Meds:  [MAR Hold] enoxaparin (LOVENOX) injection  40 mg Subcutaneous Q24H   Continuous Infusions:  lactated ringers 125 mL/hr at 06/05/22 0908   [MAR Hold] piperacillin-tazobactam (ZOSYN)  IV 12.5 mL/hr at 06/05/22 0908   [MAR Hold] promethazine (PHENERGAN) injection (IM or IVPB)     PRN Meds:.[MAR Hold] alum & mag hydroxide-simeth, [MAR Hold]  HYDROmorphone (DILAUDID) injection, [MAR Hold] ondansetron (ZOFRAN) IV, [MAR Hold] promethazine (PHENERGAN) injection (IM or IVPB)  Antimicrobials: Anti-infectives (From admission, onward)    Start     Dose/Rate Route Frequency Ordered Stop   06/03/22 1400  [MAR Hold]  piperacillin-tazobactam (ZOSYN) IVPB 3.375 g        (MAR Hold since Fri 06/05/2022 at 1147.Hold Reason: Transfer to a Procedural area)   3.375 g 12.5 mL/hr over 240 Minutes Intravenous Every 8 hours 06/03/22 0900     06/02/22 0000  cefTRIAXone (ROCEPHIN) 2 g in sodium chloride 0.9 % 100 mL IVPB  Status:  Discontinued        2 g 200 mL/hr over 30 Minutes Intravenous Every 24 hours 06/01/22 1302 06/03/22 0856   06/01/22 1400  metroNIDAZOLE (FLAGYL) IVPB 500 mg  Status:  Discontinued        500 mg 100 mL/hr over 60 Minutes Intravenous Every 12 hours 06/01/22 1302 06/03/22 0856   06/01/22 1200  cefTRIAXone (ROCEPHIN) 2 g in sodium chloride 0.9 % 100 mL IVPB  Status:  Discontinued        2 g 200 mL/hr over 30 Minutes Intravenous Every 24 hours 06/01/22 0432 06/01/22 0801   06/01/22 0900  Ampicillin-Sulbactam (UNASYN) 3 g in sodium chloride 0.9 % 100 mL IVPB  Status:  Discontinued        3 g 200 mL/hr over 30 Minutes  Intravenous Every 6 hours 06/01/22 0801 06/01/22 1302   06/01/22 0030  cefTRIAXone (ROCEPHIN) 1 g in sodium chloride 0.9 % 100 mL IVPB        1 g 200 mL/hr over 30 Minutes Intravenous  Once 06/01/22 0027 06/01/22 0155   06/01/22 0030  metroNIDAZOLE (FLAGYL) IVPB 500 mg  Status:  Discontinued        500 mg 100 mL/hr over 60 Minutes Intravenous Every 12 hours 06/01/22 0027 06/01/22 0801        I have personally reviewed the following labs and images: CBC: Recent Labs  Lab 06/01/22 0450 06/02/22 0428 06/03/22 0449 06/04/22 0413 06/05/22 0655  WBC 13.5* 12.6* 13.9* 17.4* 29.6*  NEUTROABS 12.0*  --   --   --  26.8*  HGB 13.6 11.6* 13.1 15.2* 13.7  HCT 41.6 36.0 39.4 47.4* 41.4  MCV 88.7 90.2 87.8 89.1 88.3  PLT 265 300 358 466* 441*   BMP &GFR Recent Labs  Lab 06/01/22 0450 06/02/22 0428 06/03/22 0449 06/04/22 0413 06/05/22 0655  NA 137 140 140 139 135  K 4.2 3.7 3.5 3.9 4.0  CL 107  109 108 108 100  CO2 '22 24 23 22 26  '$ GLUCOSE 116* 110* 123* 157* 143*  BUN '7 8 6 8 17  '$ CREATININE 0.80 0.84 0.77 0.94 1.02*  CALCIUM 7.9* 8.2* 8.5* 8.2* 8.1*  MG  --  1.9 2.1 1.9 1.9  PHOS  --  3.2 3.4 4.1 3.3   Estimated Creatinine Clearance: 84 mL/min (A) (by C-G formula based on SCr of 1.02 mg/dL (H)). Liver & Pancreas: Recent Labs  Lab 05/31/22 2224 06/01/22 0450 06/02/22 0428 06/03/22 0449 06/04/22 0413 06/05/22 0655  AST 14* 12*  --   --   --  8*  ALT 15 13  --   --   --  10  ALKPHOS 83 70  --   --   --  58  BILITOT 0.8 0.7  --   --   --  0.8  PROT 7.6 6.7  --   --   --  6.3*  ALBUMIN 3.2* 3.0* 2.6* 2.9* 2.5* 2.4*   Recent Labs  Lab 05/31/22 2224  LIPASE 23   No results for input(s): "AMMONIA" in the last 168 hours. Diabetic: No results for input(s): "HGBA1C" in the last 72 hours.  Recent Labs  Lab 06/04/22 0738 06/04/22 1644 06/05/22 0101 06/05/22 0759 06/05/22 1152  GLUCAP 178* 171* 143* 144* 124*   Cardiac Enzymes: No results for input(s): "CKTOTAL",  "CKMB", "CKMBINDEX", "TROPONINI" in the last 168 hours. No results for input(s): "PROBNP" in the last 8760 hours. Coagulation Profile: No results for input(s): "INR", "PROTIME" in the last 168 hours. Thyroid Function Tests: No results for input(s): "TSH", "T4TOTAL", "FREET4", "T3FREE", "THYROIDAB" in the last 72 hours. Lipid Profile: No results for input(s): "CHOL", "HDL", "LDLCALC", "TRIG", "CHOLHDL", "LDLDIRECT" in the last 72 hours. Anemia Panel: No results for input(s): "VITAMINB12", "FOLATE", "FERRITIN", "TIBC", "IRON", "RETICCTPCT" in the last 72 hours. Urine analysis:    Component Value Date/Time   COLORURINE YELLOW 06/01/2022 0129   APPEARANCEUR CLEAR 06/01/2022 0129   LABSPEC <=1.005 06/01/2022 0129   PHURINE 5.5 06/01/2022 0129   GLUCOSEU NEGATIVE 06/01/2022 0129   HGBUR NEGATIVE 06/01/2022 0129   BILIRUBINUR NEGATIVE 06/01/2022 0129   BILIRUBINUR neg 03/25/2020 1517   KETONESUR NEGATIVE 06/01/2022 0129   PROTEINUR NEGATIVE 06/01/2022 0129   UROBILINOGEN 0.2 03/25/2020 1517   UROBILINOGEN 0.2 11/28/2014 0735   NITRITE NEGATIVE 06/01/2022 0129   LEUKOCYTESUR NEGATIVE 06/01/2022 0129   Sepsis Labs: Invalid input(s): "PROCALCITONIN", "LACTICIDVEN"  Microbiology: No results found for this or any previous visit (from the past 240 hour(s)).  Radiology Studies: CT ABDOMEN PELVIS W CONTRAST  Result Date: 06/04/2022 CLINICAL DATA:  Evaluate diverticulitis.  Complications suspected. EXAM: CT ABDOMEN AND PELVIS WITH CONTRAST TECHNIQUE: Multidetector CT imaging of the abdomen and pelvis was performed using the standard protocol following bolus administration of intravenous contrast. RADIATION DOSE REDUCTION: This exam was performed according to the departmental dose-optimization program which includes automated exposure control, adjustment of the mA and/or kV according to patient size and/or use of iterative reconstruction technique. CONTRAST:  178m OMNIPAQUE IOHEXOL 300 MG/ML   SOLN COMPARISON:  CT abdomen and pelvis 05/31/2022 FINDINGS: Lower chest: Compressive atelectasis at both lower lobes. Volume loss at the lung bases has progressed since 05/31/2022. Trace pleural fluid bilaterally. Hepatobiliary: Increased perihepatic ascites. Small amount of free air along the anterior aspect of the liver. Layering sludge or stones in the gallbladder. No discrete hepatic lesion. No intrahepatic or extrahepatic biliary dilatation. Pancreas: Unremarkable. No pancreatic ductal dilatation or surrounding inflammatory  changes. Spleen: Normal in size without focal abnormality. Adrenals/Urinary Tract: Normal adrenal glands. Small hypodensity in the posterior left kidney upper pole likely represents a cyst and this not require dedicated follow-up. No hydronephrosis. No suspicious renal lesions. Urinary bladder is decompressed. Stomach/Bowel: Again noted are colonic diverticula in the proximal sigmoid colon with some inflammatory changes in this area. There may be another prominent colonic diverticulum along the left side of the colon on sequence 2 image 40 versus a small pericolonic fluid collection. Increased dilatation of small bowel loops in the left upper abdomen. These dilated loops of small bowel contain oral contrast. Distal small bowel loops are decompressed. In addition, there may be focal narrowing along the left side of the transverse colon best seen on sequence 2 image 35 and this represents an interval change. This area of colonic narrowing is adjacent to the dilated loops of small bowel. Increased mesenteric edema particularly associated with the area of small bowel dilatation. Vascular/Lymphatic: Vascular structures are unremarkable. Normal caliber of the abdominal aorta. Portal venous system is patent. There is no significant lymph node enlargement in the abdomen or pelvis. Reproductive: Again noted is a hyperdense or enhancing large structure associated with the uterus that measures 6.2 cm  and likely represents a large uterine fibroid. Again noted is an IUD within the uterus. Limited evaluation of the adnexa. Other: Markedly increased mesenteric edema throughout the abdomen. Again noted are small pockets of free air in the abdomen which have slightly increased. There are multiple small pockets of fluid in the abdomen or pelvis which have progressed. Fluid in the cul-de-sac measures 3.0 x 4.7 cm. Increased fluid in the right lower quadrant on sequence 2 image 70 and increased focus of fluid in the anterior lower abdomen on image 71. Increased fluid in the right paracolic gutter and around the liver. Question a focus of fluid with gas versus a prominent diverticulum along left colon on image 39. Increased loculated / irregular fluid in the lower left paracolic gutter. Musculoskeletal: No acute bone abnormality. IMPRESSION: 1. Worsening inflammatory process throughout the abdomen and pelvis. Increased ascites throughout the abdomen with multiple small fluid pockets. There small fluid pockets concerning for developing abscess collections throughout the abdomen and pelvis. In addition, there is slightly increased free air within the abdomen. Findings are compatible with the sequelae of perforated diverticulitis. Source of the diverticulitis is probably from the proximal sigmoid colon but less conspicuous compared to the previous CT examination. 2. Increased dilatation of proximal small bowel loops with increased mesenteric edema in this area. There is also focal narrowing of the transverse colon adjacent to the small bowel dilatation. These findings raise concern for a developing bowel obstruction. 3. Trace pleural effusions with increased atelectasis at the lung bases. 4. Gallbladder sludge and/or stones. 5. Prominent uterine fibroid. These results were called by telephone at the time of interpretation on 06/04/2022 at 12:37 pm to provider [facility] , who verbally acknowledged these results.  Electronically Signed   By: Markus Daft M.D.   On: 06/04/2022 12:41      Ekaterini Capitano [facility] [facility]  If 7PM-7AM, please contact night-coverage [email or web address] 06/05/2022, 11:55 AM

## 2022-06-05 NOTE — Progress Notes (Signed)
Patient on Dilaudid PCA having bradypnea, is on ETCO2 monitoring and easily arousable. Plan to reduce dosing some and continue close monitoring.

## 2022-06-05 NOTE — Interval H&P Note (Signed)
History and Physical Interval Note:  06/05/2022 11:50 AM  Morgan Cline  has presented today for surgery, with the diagnosis of DIVERTICULITIS.  The various methods of treatment have been discussed with the patient and family. After consideration of risks, benefits and other options for treatment, the patient has consented to  Procedure(s): TOTAL COLECTOMY (N/A) as a surgical intervention.  The patient's history has been reviewed, patient examined, no change in status, stable for surgery.  I have reviewed the patient's chart and labs.  Questions were answered to the patient's satisfaction.     Stark Klein

## 2022-06-05 NOTE — Anesthesia Preprocedure Evaluation (Addendum)
Anesthesia Evaluation  Patient identified by MRN, date of birth, ID band Patient awake    Reviewed: Allergy & Precautions, NPO status , Patient's Chart, lab work & pertinent test results  Airway Mallampati: II  TM Distance: >3 FB Neck ROM: Full    Dental no notable dental hx.    Pulmonary neg pulmonary ROS,    Pulmonary exam normal breath sounds clear to auscultation       Cardiovascular negative cardio ROS Normal cardiovascular exam Rhythm:Regular Rate:Normal     Neuro/Psych  Headaches, negative psych ROS   GI/Hepatic Neg liver ROS, Diverticulitis- worsening of her diverticulitis despite broadening abx coverage, fluid collections do not look amenable to drainage by IR.    Endo/Other  Morbid obesityBMI 44  Renal/GU negative Renal ROS  negative genitourinary   Musculoskeletal  (+) Arthritis , Osteoarthritis,    Abdominal   Peds  Hematology negative hematology ROS (+) Hb 15.2   Anesthesia Other Findings   Reproductive/Obstetrics negative OB ROS                            Anesthesia Physical Anesthesia Plan  ASA: 3  Anesthesia Plan: General   Post-op Pain Management: Tylenol PO (pre-op)*   Induction: Intravenous  PONV Risk Score and Plan: 4 or greater and Ondansetron, Dexamethasone, Midazolam and Treatment may vary due to age or medical condition  Airway Management Planned: Oral ETT  Additional Equipment: None  Intra-op Plan:   Post-operative Plan: Extubation in OR  Informed Consent: I have reviewed the patients History and Physical, chart, labs and discussed the procedure including the risks, benefits and alternatives for the proposed anesthesia with the patient or authorized representative who has indicated his/her understanding and acceptance.     Dental advisory given  Plan Discussed with: CRNA  Anesthesia Plan Comments:        Anesthesia Quick Evaluation

## 2022-06-05 NOTE — Transfer of Care (Signed)
Immediate Anesthesia Transfer of Care Note  Patient: Morgan Cline  Procedure(s) Performed: TOTAL COLECTOMY  Patient Location: PACU  Anesthesia Type:General  Level of Consciousness: drowsy and patient cooperative  Airway & Oxygen Therapy: Patient Spontanous Breathing and Patient connected to face mask oxygen  Post-op Assessment: Report given to RN and Post -op Vital signs reviewed and stable  Post vital signs: Reviewed and stable  Last Vitals:  Vitals Value Taken Time  BP 131/67 06/05/22 1606  Temp 37.1 C 06/05/22 1606  Pulse 122 06/05/22 1614  Resp 23 06/05/22 1614  SpO2 100 % 06/05/22 1614  Vitals shown include unvalidated device data.  Last Pain:  Vitals:   06/05/22 1154  TempSrc: Oral  PainSc: 0-No pain      Patients Stated Pain Goal: 2 (53/69/22 3009)  Complications: No notable events documented.

## 2022-06-05 NOTE — Anesthesia Procedure Notes (Signed)
Procedure Name: Intubation Date/Time: 06/05/2022 12:54 PM  Performed by: Maxwell Caul, CRNAPre-anesthesia Checklist: Patient identified, Emergency Drugs available, Suction available and Patient being monitored Patient Re-evaluated:Patient Re-evaluated prior to induction Oxygen Delivery Method: Circle system utilized Preoxygenation: Pre-oxygenation with 100% oxygen Induction Type: IV induction Ventilation: Mask ventilation without difficulty Laryngoscope Size: Mac and 4 Grade View: Grade I Tube type: Oral Tube size: 7.5 mm Number of attempts: 1 Airway Equipment and Method: Stylet Placement Confirmation: ETT inserted through vocal cords under direct vision, positive ETCO2 and breath sounds checked- equal and bilateral Secured at: 22 cm Tube secured with: Tape Dental Injury: Teeth and Oropharynx as per pre-operative assessment

## 2022-06-05 NOTE — Anesthesia Postprocedure Evaluation (Signed)
Anesthesia Post Note  Patient: Morgan Cline  Procedure(s) Performed: TOTAL COLECTOMY     Patient location during evaluation: PACU Anesthesia Type: General Level of consciousness: sedated Pain management: pain level controlled Vital Signs Assessment: post-procedure vital signs reviewed and stable Respiratory status: spontaneous breathing and respiratory function stable Cardiovascular status: stable Postop Assessment: no apparent nausea or vomiting Anesthetic complications: no   No notable events documented.  Last Vitals:  Vitals:   06/05/22 1658 06/05/22 1700  BP:  (!) 134/100  Pulse:  (!) 118  Resp: 17 17  Temp:    SpO2: 98% 98%    Last Pain:  Vitals:   06/05/22 1658  TempSrc:   PainSc: 6                  Johntavious Francom DANIEL

## 2022-06-06 ENCOUNTER — Encounter (HOSPITAL_COMMUNITY): Payer: Self-pay | Admitting: General Surgery

## 2022-06-06 DIAGNOSIS — R000 Tachycardia, unspecified: Secondary | ICD-10-CM

## 2022-06-06 DIAGNOSIS — D649 Anemia, unspecified: Secondary | ICD-10-CM

## 2022-06-06 DIAGNOSIS — A09 Infectious gastroenteritis and colitis, unspecified: Secondary | ICD-10-CM | POA: Diagnosis not present

## 2022-06-06 DIAGNOSIS — E871 Hypo-osmolality and hyponatremia: Secondary | ICD-10-CM | POA: Diagnosis not present

## 2022-06-06 DIAGNOSIS — K5720 Diverticulitis of large intestine with perforation and abscess without bleeding: Secondary | ICD-10-CM | POA: Diagnosis not present

## 2022-06-06 DIAGNOSIS — R739 Hyperglycemia, unspecified: Secondary | ICD-10-CM | POA: Diagnosis not present

## 2022-06-06 LAB — GLUCOSE, CAPILLARY
Glucose-Capillary: 118 mg/dL — ABNORMAL HIGH (ref 70–99)
Glucose-Capillary: 124 mg/dL — ABNORMAL HIGH (ref 70–99)
Glucose-Capillary: 138 mg/dL — ABNORMAL HIGH (ref 70–99)
Glucose-Capillary: 155 mg/dL — ABNORMAL HIGH (ref 70–99)

## 2022-06-06 LAB — CBC WITH DIFFERENTIAL/PLATELET
Abs Immature Granulocytes: 0.54 10*3/uL — ABNORMAL HIGH (ref 0.00–0.07)
Basophils Absolute: 0.1 10*3/uL (ref 0.0–0.1)
Basophils Relative: 0 %
Eosinophils Absolute: 0 10*3/uL (ref 0.0–0.5)
Eosinophils Relative: 0 %
HCT: 34.6 % — ABNORMAL LOW (ref 36.0–46.0)
Hemoglobin: 11.2 g/dL — ABNORMAL LOW (ref 12.0–15.0)
Immature Granulocytes: 2 %
Lymphocytes Relative: 3 %
Lymphs Abs: 0.9 10*3/uL (ref 0.7–4.0)
MCH: 28.9 pg (ref 26.0–34.0)
MCHC: 32.4 g/dL (ref 30.0–36.0)
MCV: 89.4 fL (ref 80.0–100.0)
Monocytes Absolute: 1.3 10*3/uL — ABNORMAL HIGH (ref 0.1–1.0)
Monocytes Relative: 5 %
Neutro Abs: 23.6 10*3/uL — ABNORMAL HIGH (ref 1.7–7.7)
Neutrophils Relative %: 90 %
Platelets: 376 10*3/uL (ref 150–400)
RBC: 3.87 MIL/uL (ref 3.87–5.11)
RDW: 14.8 % (ref 11.5–15.5)
WBC: 26.4 10*3/uL — ABNORMAL HIGH (ref 4.0–10.5)
nRBC: 0 % (ref 0.0–0.2)

## 2022-06-06 LAB — COMPREHENSIVE METABOLIC PANEL
ALT: 9 U/L (ref 0–44)
AST: 10 U/L — ABNORMAL LOW (ref 15–41)
Albumin: 2.5 g/dL — ABNORMAL LOW (ref 3.5–5.0)
Alkaline Phosphatase: 39 U/L (ref 38–126)
Anion gap: 7 (ref 5–15)
BUN: 15 mg/dL (ref 6–20)
CO2: 25 mmol/L (ref 22–32)
Calcium: 7.8 mg/dL — ABNORMAL LOW (ref 8.9–10.3)
Chloride: 108 mmol/L (ref 98–111)
Creatinine, Ser: 0.76 mg/dL (ref 0.44–1.00)
GFR, Estimated: >60 mL/min (ref >60)
Glucose, Bld: 149 mg/dL — ABNORMAL HIGH (ref 70–99)
Potassium: 4.2 mmol/L (ref 3.5–5.1)
Sodium: 140 mmol/L (ref 135–145)
Total Bilirubin: 0.8 mg/dL (ref 0.3–1.2)
Total Protein: 5.1 g/dL — ABNORMAL LOW (ref 6.5–8.1)

## 2022-06-06 LAB — MAGNESIUM: Magnesium: 2.1 mg/dL (ref 1.7–2.4)

## 2022-06-06 LAB — PHOSPHORUS: Phosphorus: 3.5 mg/dL (ref 2.5–4.6)

## 2022-06-06 NOTE — Plan of Care (Signed)

## 2022-06-06 NOTE — Evaluation (Signed)
Physical Therapy Evaluation Patient Details Name: Morgan Cline MRN: [id number] DOB: December 23, 1970 Today's Date: 06/06/2022  History of Present Illness  51 year old female with PMH of morbid obesity, prediabetes and migraine headache returning with worsening abdominal pain due to acute sigmoid diverticulitis despite oral antibiotics.  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  Clinical Impression  Pt admitted with above diagnosis.  Pt currently with functional limitations due to the deficits listed below (see PT Problem List). Pt will benefit from skilled PT to increase their independence and safety with mobility to allow discharge to the venue listed below.  Pt assisted with ambulating in hallway POD #1.  Pt anticipates d/c home with spouse and likely will progress well with no f/u PT needs.        Recommendations for follow up therapy are one component of a multi-disciplinary discharge planning process, led by the attending physician.  Recommendations may be updated based on patient status, additional functional criteria and insurance authorization.  Follow Up Recommendations No PT follow up      Assistance Recommended at Discharge PRN  Patient can return home with the following  A little help with walking and/or transfers;A little help with bathing/dressing/bathroom;Help with stairs or ramp for entrance    Equipment Recommendations None recommended by PT  Recommendations for Other Services       Functional Status Assessment Patient has had a recent decline in their functional status and demonstrates the ability to make significant improvements in function in a reasonable and predictable amount of time.     Precautions / Restrictions Precautions Precautions: Fall Precaution Comments: multiple lines, NG tube, colostomy      Mobility  Bed Mobility Overal bed mobility: Needs Assistance Bed Mobility: Rolling, Sidelying  to Sit Rolling: Mod assist Sidelying to sit: Mod assist, +2 for physical assistance, HOB elevated       General bed mobility comments: verbal cues for log roll technique; pt requiring assist for completing roll and trunk upright likely mostly due to pain    Transfers Overall transfer level: Needs assistance Equipment used: Rolling walker (2 wheels) Transfers: Sit to/from Stand Sit to Stand: Min assist, From elevated surface           General transfer comment: verbal cues for hand placement and use of legs to self assist    Ambulation/Gait Ambulation/Gait assistance: Min guard, +2 safety/equipment Gait Distance (Feet): 80 Feet Assistive device: Rolling walker (2 wheels) Gait Pattern/deviations: Step-through pattern, Decreased stride length, Trunk flexed       General Gait Details: verbal cues for RW positioning and posture; recliner following for safety first time ambulating after surgery  Stairs            Wheelchair Mobility    Modified Rankin (Stroke Patients Only)       Balance                                             Pertinent Vitals/Pain Pain Assessment Pain Assessment: Faces Faces Pain Scale: Hurts a little bit Pain Location: abdomen Pain Descriptors / Indicators: Sore Pain Intervention(s): Repositioned, PCA encouraged, Monitored during session    Home Living Family/patient expects to be discharged to:: Private residence Living Arrangements: Children   Type of Home: House Home Access: Stairs to enter   [employer] of Steps: 3  Home Equipment: Conservation officer, nature (2 wheels)      Prior Function Prior Level of Function : Independent/Modified Independent                     Hand Dominance        Extremity/Trunk Assessment        Lower Extremity Assessment Lower Extremity Assessment: Generalized weakness       Communication   Communication: No difficulties  Cognition Arousal/Alertness:  Awake/alert Behavior During Therapy: WFL for tasks assessed/performed Overall Cognitive Status: Within Functional Limits for tasks assessed                                          General Comments      Exercises     Assessment/Plan    PT Assessment Patient needs continued PT services  PT Problem List Decreased strength;Decreased activity tolerance;Decreased mobility;Decreased knowledge of use of DME       PT Treatment Interventions Gait training;Therapeutic exercise;DME instruction;Functional mobility training;Therapeutic activities;Patient/family education    PT Goals (Current goals can be found in the Care Plan section)  Acute Rehab PT Goals PT Goal Formulation: With patient Time For Goal Achievement: 06/20/22 Potential to Achieve Goals: Good    Frequency Min 3X/week     Co-evaluation               AM-PAC PT "6 Clicks" Mobility  Outcome Measure Help needed turning from your back to your side while in a flat bed without using bedrails?: A Lot Help needed moving from lying on your back to sitting on the side of a flat bed without using bedrails?: A Lot Help needed moving to and from a bed to a chair (including a wheelchair)?: A Lot Help needed standing up from a chair using your arms (e.g., wheelchair or bedside chair)?: A Little Help needed to walk in hospital room?: A Little Help needed climbing 3-5 steps with a railing? : A Lot 6 Click Score: 14    End of Session Equipment Utilized During Treatment: Gait belt Activity Tolerance: Patient tolerated treatment well Patient left: in chair;with call bell/phone within reach;with nursing/sitter in room Nurse Communication: Mobility status PT Visit Diagnosis: Other abnormalities of gait and mobility (R26.89)    Time: 0938-1829 PT Time Calculation (min) (ACUTE ONLY): 24 min   Charges:   PT Evaluation $PT Eval Low Complexity: 1 Low        Kati PT, DPT Physical Therapist Acute  Rehabilitation Services Preferred contact method: Secure Chat Weekend Pager Only: [phone] Office: [place] 06/06/2022, 1:43 PM

## 2022-06-06 NOTE — Progress Notes (Signed)
Patient had some episodes of bradypnea RR ranging 6-10 . Patient receives Hydromorphone via PCA pump. She is easily arousable and her vitals are normal. ETCO2 is ranging from40-42. Advised patient to breathe 12-20 breaths per minute.  Oncall informed and will closely monitor the patient.

## 2022-06-06 NOTE — Progress Notes (Signed)
Progress Note: General Surgery Service   Chief Complaint/Subjective: Pain around incisions.  Hasn't been up yet, about to work with PT.  Objective: Vital signs in last 24 hours: Temp:  [97.7 F (36.5 C)-99.3 F (37.4 C)] 97.9 F (36.6 C) (07/01 0849) Pulse Rate:  [90-124] 90 (07/01 0849) Resp:  [8-24] 16 (07/01 0922) BP: (114-149)/(67-100) 119/84 (07/01 0849) SpO2:  [92 %-100 %] 99 % (07/01 0922) FiO2 (%):  [21 %] 21 % (07/01 0020) Weight:  [106.6 kg] 106.6 kg (06/30 1154) Last BM Date :  (PTA)  Intake/Output from previous day: 06/30 0701 - 07/01 0700 In: 6225.6 [I.V.:4881.5; IV Piggyback:1344.1] Out: 970 [Urine:570; Emesis/NG output:50; Stool:50; Blood:300] Intake/Output this shift: No intake/output data recorded.  GI: Abd incision bandaged, dressing c/d/I, ostomy no function  Lab Results: CBC  Recent Labs    06/05/22 0655 06/05/22 1505 06/06/22 0426  WBC 29.6*  --  26.4*  HGB 13.7 12.6 11.2*  HCT 41.4 37.0 34.6*  PLT 441*  --  376   BMET Recent Labs    06/05/22 0655 06/05/22 1505 06/06/22 0426  NA 135 137 140  K 4.0 3.8 4.2  CL 100 101 108  CO2 26  --  25  GLUCOSE 143* 139* 149*  BUN '17 12 15  '$ CREATININE 1.02* 0.70 0.76  CALCIUM 8.1*  --  7.8*   PT/INR No results for input(s): "LABPROT", "INR" in the last 72 hours. ABG No results for input(s): "PHART", "HCO3" in the last 72 hours.  Invalid input(s): "PCO2", "PO2"  Anti-infectives: Anti-infectives (From admission, onward)    Start     Dose/Rate Route Frequency Ordered Stop   06/03/22 1400  piperacillin-tazobactam (ZOSYN) IVPB 3.375 g        3.375 g 12.5 mL/hr over 240 Minutes Intravenous Every 8 hours 06/03/22 0900     06/02/22 0000  cefTRIAXone (ROCEPHIN) 2 g in sodium chloride 0.9 % 100 mL IVPB  Status:  Discontinued        2 g 200 mL/hr over 30 Minutes Intravenous Every 24 hours 06/01/22 1302 06/03/22 0856   06/01/22 1400  metroNIDAZOLE (FLAGYL) IVPB 500 mg  Status:  Discontinued        500  mg 100 mL/hr over 60 Minutes Intravenous Every 12 hours 06/01/22 1302 06/03/22 0856   06/01/22 1200  cefTRIAXone (ROCEPHIN) 2 g in sodium chloride 0.9 % 100 mL IVPB  Status:  Discontinued        2 g 200 mL/hr over 30 Minutes Intravenous Every 24 hours 06/01/22 0432 06/01/22 0801   06/01/22 0900  Ampicillin-Sulbactam (UNASYN) 3 g in sodium chloride 0.9 % 100 mL IVPB  Status:  Discontinued        3 g 200 mL/hr over 30 Minutes Intravenous Every 6 hours 06/01/22 0801 06/01/22 1302   06/01/22 0030  cefTRIAXone (ROCEPHIN) 1 g in sodium chloride 0.9 % 100 mL IVPB        1 g 200 mL/hr over 30 Minutes Intravenous  Once 06/01/22 0027 06/01/22 0155   06/01/22 0030  metroNIDAZOLE (FLAGYL) IVPB 500 mg  Status:  Discontinued        500 mg 100 mL/hr over 60 Minutes Intravenous Every 12 hours 06/01/22 0027 06/01/22 0801       Medications: Scheduled Meds:  Chlorhexidine Gluconate Cloth  6 each Topical Daily   enoxaparin (LOVENOX) injection  40 mg Subcutaneous Q24H   HYDROmorphone   Intravenous Q4H   metoprolol tartrate  2.5 mg Intravenous Q6H  Continuous Infusions:  acetaminophen 1,000 mg (06/06/22 0622)   lactated ringers 125 mL/hr at 06/05/22 2151   methocarbamol (ROBAXIN) IV     piperacillin-tazobactam (ZOSYN)  IV 3.375 g (06/06/22 0600)   promethazine (PHENERGAN) injection (IM or IVPB)     PRN Meds:.alum & mag hydroxide-simeth, methocarbamol (ROBAXIN) IV, ondansetron (ZOFRAN) IV, phenol, promethazine (PHENERGAN) injection (IM or IVPB)  Assessment/Plan: [name] is a 51 year old female with perforated diverticulitis s/p Hartmann's procedure on 06/05/22, with Dr. Barry Dienes.  Vitals stable, labs reviewed Doing okay for postoperative day 1 Continue NG to LIWS Increase activity - appreciate PT assistance Remove foley    LOS: 5 days   FEN: NPO NG AROBF ID: [email or web address] VTE: Scds, lovenox Foley: Remove Dispo: continued care on floor    Felicie Morn, MD  [facility] Use [email or web address] to contact on call provider  Daily Billing: [phone] - post op

## 2022-06-06 NOTE — Progress Notes (Signed)
PROGRESS NOTE  Morgan Cline PZW:[id number] DOB: [birth year]   PCP: [facility] [place]  Patient is from: Home.  DOA: 05/31/2022 LOS: 5  Chief complaints Chief Complaint  Patient presents with   Abdominal Pain     Brief Narrative / Interim history: 51 year old F with PMH of morbid obesity, prediabetes and migraine headache returning with worsening abdominal pain due to acute sigmoid diverticulitis despite oral antibiotics.  Patient was seen in ED 2 days prior and diagnosed with sigmoid diverticulitis for which she was discharged on p.o. Augmentin.  However, she continued to have worsening abdominal pain and return for further evaluation.  In ED, vitals stable.  CMP with mild hyponatremia and hyperglycemia.  CBC with mild leukocytosis with left shift.  Pregnancy test and UA negative.  CT abdomen and pelvis with contrast showed increase in the degree of sigmoid diverticulitis with evidence of new micro perforations and free air, mild increase in free fluid in pelvis but no abscess.    Patient with worsening abdominal pain and leukocytosis after interval improvement despite IV Zosyn.  Repeat CT A/P with worsening perforated diverticulitis with abscess formation, and increased dilation of proximal small bowel loops with increased mesenteric edema in the area raising concern for developing bowel obstruction.  Underwent Hartman's procedure on 06/05/2022 by Dr. Barry Dienes.  Subjective: Seen and examined earlier this morning.  Patient was started on PCA Dilaudid after surgery.  Pain fairly controlled.  She says she feels sore in her abdomen and her neck.  No other complaints.  Objective: Vitals:   06/06/22 0410 06/06/22 0418 06/06/22 0849 06/06/22 0922  BP: (!) 149/90  119/84   Pulse: 100  90   Resp: (!) 22 (!) '22 18 16  '$ Temp: 97.8 F (36.6 C)  97.9 F (36.6 C)   TempSrc: Oral  Oral   SpO2: 99% 99% 98% 99%  Weight:      Height:        Examination:  GENERAL: No apparent distress.   Nontoxic. HEENT: MMM.  Vision and hearing grossly intact.  NECK: Supple.  No apparent JVD.  RESP:  No IWOB.  Fair aeration bilaterally. CVS:  RRR. Heart sounds normal.  ABD/GI/GU: Abdomen soft.  Ostomy bag empty. MSK/EXT:  Moves extremities. No apparent deformity. No edema.  SKIN: no apparent skin lesion or wound NEURO: Awake.  Oriented appropriately.  No apparent focal neuro deficit. PSYCH: Calm. Normal affect.   Procedures:  06/05/2022-Hartman's procedure by Dr. Barry Dienes  Microbiology summarized: None  Assessment and plan: Principal Problem:   Perforation of sigmoid colon due to diverticulitis Active Problems:   Obesity, Class III, BMI 40-49.9 (morbid obesity) ([place])   Sigmoid diverticulitis   Diarrhea   Hyperglycemia   Hyponatremia   Hypokalemia   Bandemia   Thrombocytosis  Sigmoid diverticulitis with microperforation colon: Worsening pain and leukocytosis despite antibiotic. Repeat CT A/P with worsening perforated diverticulitis with abscess formation, and increased dilation of proximal small bowel loops with increased mesenteric edema in the area raising concern for developing bowel obstruction.  Lactic acid normal.  Leukocytosis improving -General surgery following. -S/p Hartman's procedure on 6/30 -CTX and Flagyl 6/26-6/28>> IV Zosyn 6/28>>> -N.p.o., IV fluid, antiemetics and pain control.  On PCA Dilaudid -PT/OT  Diarrhea: Reports ongoing small liquidy stool but not large volume.  Likely due to the above.  Hyperglycemia: History of prediabetes.  She is on Ozempic at home.  A1c 5.6%.  Normocytic anemia: Probably some surgical loss and hemodilution. Recent Labs    05/29/22 0601  05/31/22 2224 06/01/22 0450 06/02/22 0428 06/03/22 0449 06/04/22 0413 06/05/22 0655 06/05/22 1505 06/06/22 0426  HGB 13.3 14.1 13.6 11.6* 13.1 15.2* 13.7 12.6 11.2*  -Check H&H -Check anemia panel in the morning  Leukocytosis/bandemia/thrombocytosis: Likely due to #1. -Antibiotic as  above -Monitor  Hyponatremia: Resolved.  Hypokalemia: Resolved.  Morbid obesity Body mass index is 40.34 kg/m. -Encourage lifestyle change to lose weight.     DVT prophylaxis:  enoxaparin (LOVENOX) injection 40 mg Start: 06/03/22 2200 SCDs Start: 06/01/22 0431  Code Status: Full code Family Communication: Updated family member at bedside. Level of care: Progressive Status is: Inpatient Remains inpatient appropriate because: Perforated sigmoid diverticulitis   Final disposition: TBD Consultants:  General surgery  Sch Meds:  Scheduled Meds:  Chlorhexidine Gluconate Cloth  6 each Topical Daily   enoxaparin (LOVENOX) injection  40 mg Subcutaneous Q24H   HYDROmorphone   Intravenous Q4H   metoprolol tartrate  2.5 mg Intravenous Q6H   Continuous Infusions:  acetaminophen 1,000 mg (06/06/22 0622)   lactated ringers 125 mL/hr at 06/05/22 2151   methocarbamol (ROBAXIN) IV     piperacillin-tazobactam (ZOSYN)  IV 3.375 g (06/06/22 0600)   promethazine (PHENERGAN) injection (IM or IVPB)     PRN Meds:.alum & mag hydroxide-simeth, methocarbamol (ROBAXIN) IV, ondansetron (ZOFRAN) IV, phenol, promethazine (PHENERGAN) injection (IM or IVPB)  Antimicrobials: Anti-infectives (From admission, onward)    Start     Dose/Rate Route Frequency Ordered Stop   06/03/22 1400  piperacillin-tazobactam (ZOSYN) IVPB 3.375 g        3.375 g 12.5 mL/hr over 240 Minutes Intravenous Every 8 hours 06/03/22 0900     06/02/22 0000  cefTRIAXone (ROCEPHIN) 2 g in sodium chloride 0.9 % 100 mL IVPB  Status:  Discontinued        2 g 200 mL/hr over 30 Minutes Intravenous Every 24 hours 06/01/22 1302 06/03/22 0856   06/01/22 1400  metroNIDAZOLE (FLAGYL) IVPB 500 mg  Status:  Discontinued        500 mg 100 mL/hr over 60 Minutes Intravenous Every 12 hours 06/01/22 1302 06/03/22 0856   06/01/22 1200  cefTRIAXone (ROCEPHIN) 2 g in sodium chloride 0.9 % 100 mL IVPB  Status:  Discontinued        2 g 200 mL/hr  over 30 Minutes Intravenous Every 24 hours 06/01/22 0432 06/01/22 0801   06/01/22 0900  Ampicillin-Sulbactam (UNASYN) 3 g in sodium chloride 0.9 % 100 mL IVPB  Status:  Discontinued        3 g 200 mL/hr over 30 Minutes Intravenous Every 6 hours 06/01/22 0801 06/01/22 1302   06/01/22 0030  cefTRIAXone (ROCEPHIN) 1 g in sodium chloride 0.9 % 100 mL IVPB        1 g 200 mL/hr over 30 Minutes Intravenous  Once 06/01/22 0027 06/01/22 0155   06/01/22 0030  metroNIDAZOLE (FLAGYL) IVPB 500 mg  Status:  Discontinued        500 mg 100 mL/hr over 60 Minutes Intravenous Every 12 hours 06/01/22 0027 06/01/22 0801        I have personally reviewed the following labs and images: CBC: Recent Labs  Lab 06/01/22 0450 06/02/22 0428 06/03/22 0449 06/04/22 0413 06/05/22 0655 06/05/22 1505 06/06/22 0426  WBC 13.5* 12.6* 13.9* 17.4* 29.6*  --  26.4*  NEUTROABS 12.0*  --   --   --  26.8*  --  23.6*  HGB 13.6 11.6* 13.1 15.2* 13.7 12.6 11.2*  HCT 41.6 36.0 39.4 47.4* 41.4 37.0  34.6*  MCV 88.7 90.2 87.8 89.1 88.3  --  89.4  PLT 265 300 358 466* 441*  --  376   BMP &GFR Recent Labs  Lab 06/02/22 0428 06/03/22 0449 06/04/22 0413 06/05/22 0655 06/05/22 1505 06/06/22 0426  NA 140 140 139 135 137 140  K 3.7 3.5 3.9 4.0 3.8 4.2  CL 109 108 108 100 101 108  CO2 '24 23 22 26  '$ --  25  GLUCOSE 110* 123* 157* 143* 139* 149*  BUN '8 6 8 17 12 15  '$ CREATININE 0.84 0.77 0.94 1.02* 0.70 0.76  CALCIUM 8.2* 8.5* 8.2* 8.1*  --  7.8*  MG 1.9 2.1 1.9 1.9  --  2.1  PHOS 3.2 3.4 4.1 3.3  --  3.5   Estimated Creatinine Clearance: 102.5 mL/min (by C-G formula based on SCr of 0.76 mg/dL). Liver & Pancreas: Recent Labs  Lab 05/31/22 2224 06/01/22 0450 06/02/22 0428 06/03/22 0449 06/04/22 0413 06/05/22 0655 06/06/22 0426  AST 14* 12*  --   --   --  8* 10*  ALT 15 13  --   --   --  10 9  ALKPHOS 83 70  --   --   --  58 39  BILITOT 0.8 0.7  --   --   --  0.8 0.8  PROT 7.6 6.7  --   --   --  6.3* 5.1*   ALBUMIN 3.2* 3.0* 2.6* 2.9* 2.5* 2.4* 2.5*   Recent Labs  Lab 05/31/22 2224  LIPASE 23   No results for input(s): "AMMONIA" in the last 168 hours. Diabetic: No results for input(s): "HGBA1C" in the last 72 hours.  Recent Labs  Lab 06/05/22 0101 06/05/22 0759 06/05/22 1152 06/06/22 0052 06/06/22 0804  GLUCAP 143* 144* 124* 155* 138*   Cardiac Enzymes: No results for input(s): "CKTOTAL", "CKMB", "CKMBINDEX", "TROPONINI" in the last 168 hours. No results for input(s): "PROBNP" in the last 8760 hours. Coagulation Profile: No results for input(s): "INR", "PROTIME" in the last 168 hours. Thyroid Function Tests: No results for input(s): "TSH", "T4TOTAL", "FREET4", "T3FREE", "THYROIDAB" in the last 72 hours. Lipid Profile: No results for input(s): "CHOL", "HDL", "LDLCALC", "TRIG", "CHOLHDL", "LDLDIRECT" in the last 72 hours. Anemia Panel: No results for input(s): "VITAMINB12", "FOLATE", "FERRITIN", "TIBC", "IRON", "RETICCTPCT" in the last 72 hours. Urine analysis:    Component Value Date/Time   COLORURINE YELLOW 06/01/2022 0129   APPEARANCEUR CLEAR 06/01/2022 0129   LABSPEC <=1.005 06/01/2022 0129   PHURINE 5.5 06/01/2022 0129   GLUCOSEU NEGATIVE 06/01/2022 0129   HGBUR NEGATIVE 06/01/2022 0129   BILIRUBINUR NEGATIVE 06/01/2022 0129   BILIRUBINUR neg 03/25/2020 1517   KETONESUR NEGATIVE 06/01/2022 0129   PROTEINUR NEGATIVE 06/01/2022 0129   UROBILINOGEN 0.2 03/25/2020 1517   UROBILINOGEN 0.2 11/28/2014 0735   NITRITE NEGATIVE 06/01/2022 0129   LEUKOCYTESUR NEGATIVE 06/01/2022 0129   Sepsis Labs: Invalid input(s): "PROCALCITONIN", "LACTICIDVEN"  Microbiology: No results found for this or any previous visit (from the past 240 hour(s)).  Radiology Studies: No results found.    Ailany Koren T. [place]  If 7PM-7AM, please contact night-coverage [email or web address] 06/06/2022, 11:10 AM

## 2022-06-07 DIAGNOSIS — K5720 Diverticulitis of large intestine with perforation and abscess without bleeding: Secondary | ICD-10-CM | POA: Diagnosis not present

## 2022-06-07 LAB — CBC
HCT: 30.9 % — ABNORMAL LOW (ref 36.0–46.0)
Hemoglobin: 9.9 g/dL — ABNORMAL LOW (ref 12.0–15.0)
MCH: 29 pg (ref 26.0–34.0)
MCHC: 32 g/dL (ref 30.0–36.0)
MCV: 90.6 fL (ref 80.0–100.0)
Platelets: 366 10*3/uL (ref 150–400)
RBC: 3.41 MIL/uL — ABNORMAL LOW (ref 3.87–5.11)
RDW: 15 % (ref 11.5–15.5)
WBC: 22.1 10*3/uL — ABNORMAL HIGH (ref 4.0–10.5)
nRBC: 0 % (ref 0.0–0.2)

## 2022-06-07 LAB — COMPREHENSIVE METABOLIC PANEL
ALT: 13 U/L (ref 0–44)
AST: 16 U/L (ref 15–41)
Albumin: 2.4 g/dL — ABNORMAL LOW (ref 3.5–5.0)
Alkaline Phosphatase: 56 U/L (ref 38–126)
Anion gap: 5 (ref 5–15)
BUN: 14 mg/dL (ref 6–20)
CO2: 31 mmol/L (ref 22–32)
Calcium: 7.8 mg/dL — ABNORMAL LOW (ref 8.9–10.3)
Chloride: 105 mmol/L (ref 98–111)
Creatinine, Ser: 0.57 mg/dL (ref 0.44–1.00)
GFR, Estimated: >60 mL/min (ref >60)
Glucose, Bld: 111 mg/dL — ABNORMAL HIGH (ref 70–99)
Potassium: 3.8 mmol/L (ref 3.5–5.1)
Sodium: 141 mmol/L (ref 135–145)
Total Bilirubin: 0.6 mg/dL (ref 0.3–1.2)
Total Protein: 5.5 g/dL — ABNORMAL LOW (ref 6.5–8.1)

## 2022-06-07 LAB — VITAMIN B12: Vitamin B-12: 435 pg/mL (ref 180–914)

## 2022-06-07 LAB — RETICULOCYTES
Immature Retic Fract: 26.6 % — ABNORMAL HIGH (ref 2.3–15.9)
RBC.: 3.42 MIL/uL — ABNORMAL LOW (ref 3.87–5.11)
Retic Count, Absolute: 53 10*3/uL (ref 19.0–186.0)
Retic Ct Pct: 1.6 % (ref 0.4–3.1)

## 2022-06-07 LAB — MAGNESIUM: Magnesium: 2.5 mg/dL — ABNORMAL HIGH (ref 1.7–2.4)

## 2022-06-07 LAB — FERRITIN: Ferritin: 635 ng/mL — ABNORMAL HIGH (ref 11–307)

## 2022-06-07 LAB — IRON AND TIBC
Iron: 20 ug/dL — ABNORMAL LOW (ref 28–170)
Saturation Ratios: 17 % (ref 10.4–31.8)
TIBC: 119 ug/dL — ABNORMAL LOW (ref 250–450)
UIBC: 99 ug/dL

## 2022-06-07 LAB — GLUCOSE, CAPILLARY
Glucose-Capillary: 102 mg/dL — ABNORMAL HIGH (ref 70–99)
Glucose-Capillary: 105 mg/dL — ABNORMAL HIGH (ref 70–99)
Glucose-Capillary: 96 mg/dL (ref 70–99)

## 2022-06-07 LAB — PHOSPHORUS
Phosphorus: 1.8 mg/dL — ABNORMAL LOW (ref 2.5–4.6)
Phosphorus: 2.1 mg/dL — ABNORMAL LOW (ref 2.5–4.6)

## 2022-06-07 LAB — FOLATE: Folate: 8.6 ng/mL (ref >5.9)

## 2022-06-07 MED ORDER — MENTHOL 3 MG MT LOZG
1.0000 | LOZENGE | OROMUCOSAL | Status: DC | PRN
  Administered 2022-06-07 – 2022-06-10 (×2): 3 mg via ORAL
  Filled 2022-06-07 (×4): qty 9

## 2022-06-07 MED ORDER — ORAL CARE MOUTH RINSE: 15.0000 mL | OROMUCOSAL | Status: DC | PRN

## 2022-06-07 MED ORDER — HYDROCORTISONE 0.5 % EX CREA
TOPICAL_CREAM | Freq: Four times a day (QID) | CUTANEOUS | Status: DC
  Administered 2022-06-07 – 2022-06-22 (×11): 1 via TOPICAL
  Filled 2022-06-07 (×6): qty 28.35

## 2022-06-07 NOTE — Progress Notes (Signed)
Progress Note: General Surgery Service   Chief Complaint/Subjective: Feels little better today.  Pain around incisions.  Some fluid draining from ostomy.  Objective: Vital signs in last 24 hours: Temp:  [97.6 F (36.4 C)-98.2 F (36.8 C)] 98.1 F (36.7 C) (07/02 0402) Pulse Rate:  [64-90] 86 (07/02 0402) Resp:  [10-20] 18 (07/02 0402) BP: (99-123)/(72-84) 99/83 (07/02 0402) SpO2:  [98 %-99 %] 99 % (07/02 0402) FiO2 (%):  [28 %] 28 % (07/02 0402) Last BM Date :  (PTA)  Intake/Output from previous day: 07/01 0701 - 07/02 0700 In: 2471.2 [I.V.:2309.7; IV Piggyback:161.6] Out: 625 [Urine:175; Emesis/NG output:450] Intake/Output this shift: Total I/O In: 1313.4 [I.V.:1251.9; IV Piggyback:61.5] Out: 625 [Urine:175; Emesis/NG output:450]  GI: Abd incision bandaged, dressing c/d/I, ostomy with sweat  Lab Results: CBC  Recent Labs    06/06/22 0426 06/07/22 0436  WBC 26.4* 22.1*  HGB 11.2* 9.9*  HCT 34.6* 30.9*  PLT 376 366    BMET Recent Labs    06/06/22 0426 06/07/22 0436  NA 140 141  K 4.2 3.8  CL 108 105  CO2 25 31  GLUCOSE 149* 111*  BUN 15 14  CREATININE 0.76 0.57  CALCIUM 7.8* 7.8*    PT/INR No results for input(s): "LABPROT", "INR" in the last 72 hours. ABG No results for input(s): "PHART", "HCO3" in the last 72 hours.  Invalid input(s): "PCO2", "PO2"  Anti-infectives: Anti-infectives (From admission, onward)    Start     Dose/Rate Route Frequency Ordered Stop   06/03/22 1400  piperacillin-tazobactam (ZOSYN) IVPB 3.375 g        3.375 g 12.5 mL/hr over 240 Minutes Intravenous Every 8 hours 06/03/22 0900     06/02/22 0000  cefTRIAXone (ROCEPHIN) 2 g in sodium chloride 0.9 % 100 mL IVPB  Status:  Discontinued        2 g 200 mL/hr over 30 Minutes Intravenous Every 24 hours 06/01/22 1302 06/03/22 0856   06/01/22 1400  metroNIDAZOLE (FLAGYL) IVPB 500 mg  Status:  Discontinued        500 mg 100 mL/hr over 60 Minutes Intravenous Every 12 hours 06/01/22  1302 06/03/22 0856   06/01/22 1200  cefTRIAXone (ROCEPHIN) 2 g in sodium chloride 0.9 % 100 mL IVPB  Status:  Discontinued        2 g 200 mL/hr over 30 Minutes Intravenous Every 24 hours 06/01/22 0432 06/01/22 0801   06/01/22 0900  Ampicillin-Sulbactam (UNASYN) 3 g in sodium chloride 0.9 % 100 mL IVPB  Status:  Discontinued        3 g 200 mL/hr over 30 Minutes Intravenous Every 6 hours 06/01/22 0801 06/01/22 1302   06/01/22 0030  cefTRIAXone (ROCEPHIN) 1 g in sodium chloride 0.9 % 100 mL IVPB        1 g 200 mL/hr over 30 Minutes Intravenous  Once 06/01/22 0027 06/01/22 0155   06/01/22 0030  metroNIDAZOLE (FLAGYL) IVPB 500 mg  Status:  Discontinued        500 mg 100 mL/hr over 60 Minutes Intravenous Every 12 hours 06/01/22 0027 06/01/22 0801       Medications: Scheduled Meds:  Chlorhexidine Gluconate Cloth  6 each Topical Daily   enoxaparin (LOVENOX) injection  40 mg Subcutaneous Q24H   hydrocortisone cream   Topical QID   HYDROmorphone   Intravenous Q4H   metoprolol tartrate  2.5 mg Intravenous Q6H   Continuous Infusions:  lactated ringers 125 mL/hr at 06/07/22 0600   methocarbamol (ROBAXIN) IV  piperacillin-tazobactam (ZOSYN)  IV 12.5 mL/hr at 06/07/22 0600   promethazine (PHENERGAN) injection (IM or IVPB)     PRN Meds:.alum & mag hydroxide-simeth, methocarbamol (ROBAXIN) IV, ondansetron (ZOFRAN) IV, mouth rinse, phenol, promethazine (PHENERGAN) injection (IM or IVPB)  Assessment/Plan: [name] is a 51 year old female with perforated diverticulitis s/p Hartmann's procedure on 06/05/22, with Dr. Barry Dienes.  Vitals stable, labs reviewed Doing okay for postoperative day 2 Continue NG to LIWS until ostomy appliance fills up with gas Increase activity - appreciate PT assistance Acute blood loss anemia - 9.9 from 11.2, likely in part dilutional, monitor and transfuse only if necessary    LOS: 6 days   FEN: NPO NG AROBF, IVF per primary ID: [email or web address] VTE: Scds, lovenox Foley:  Remove Dispo: continued care on floor    Felicie Morn, MD  [facility] Use [email or web address] to contact on call provider  Daily Billing: [phone] - post op

## 2022-06-07 NOTE — Progress Notes (Signed)
PROGRESS NOTE    Morgan Cline  QJF:[id number] DOB: 15-Jan-1971 DOA: 05/31/2022 PCP: [facility]     Brief Narrative:  51 year old F with PMH of morbid obesity, prediabetes and migraine headache returning with worsening abdominal pain due to acute sigmoid diverticulitis despite taking oral antibiotics.  Patient was seen in ED 2 days PTA and diagnosed with sigmoid diverticulitis for which she was discharged on p.o. Augmentin.   In ED, vitals were stable.  CBC with mild leukocytosis with left shift. CT abdomen and pelvis with contrast showed increase in the degree of sigmoid diverticulitis with evidence of new micro perforations and free air, mild increase in free fluid in pelvis but no abscess.     Patient with worsening abdominal pain and leukocytosis after interval improvement despite IV Zosyn.  Repeat CT A/P with worsening perforated diverticulitis with abscess formation, and increased dilation of proximal small bowel loops with increased mesenteric edema in the area raising concern for developing bowel obstruction.  Underwent Hartman's procedure on 06/05/2022 by Dr. Barry Dienes.   New events last 24 hours / Subjective: NG tube in. Reports feeling hungry.  Pain is controlled currently.  No nausea, vomiting, fevers.  Assessment & Plan:   Principal Problem:   Perforation of sigmoid colon due to diverticulitis  Appreciate general surgery  N.p.o. with NG tube, when ostomy fills with gas can hopefully remove  Hopefully will work with PT  Dilaudid PCA  Zofran/Phenergan as needed  Zosyn   Active Problems:   Obesity, Class III, BMI 40-49.9 (morbid obesity) (HCC)  BMI is 40.34    Hyponatremia  Resolved    Hypokalemia  Resolved    Thrombocytosis  Resolved    Normocytic anemia  Will monitor    Prediabetes  Monitor  DVT prophylaxis: Lovenox Code Status: Full Family Communication: Self Coming From: Home Disposition Plan: Probably home Barriers to Discharge:  Improvement  Consultants:  General surgery  Procedures:  Hartman's procedure on 06/05/2022 w Dr. Mamie Laurel  Antimicrobials:  Anti-infectives (From admission, onward)    Start     Dose/Rate Route Frequency Ordered Stop   06/03/22 1400  piperacillin-tazobactam (ZOSYN) IVPB 3.375 g        3.375 g 12.5 mL/hr over 240 Minutes Intravenous Every 8 hours 06/03/22 0900     06/02/22 0000  cefTRIAXone (ROCEPHIN) 2 g in sodium chloride 0.9 % 100 mL IVPB  Status:  Discontinued        2 g 200 mL/hr over 30 Minutes Intravenous Every 24 hours 06/01/22 1302 06/03/22 0856   06/01/22 1400  metroNIDAZOLE (FLAGYL) IVPB 500 mg  Status:  Discontinued        500 mg 100 mL/hr over 60 Minutes Intravenous Every 12 hours 06/01/22 1302 06/03/22 0856   06/01/22 1200  cefTRIAXone (ROCEPHIN) 2 g in sodium chloride 0.9 % 100 mL IVPB  Status:  Discontinued        2 g 200 mL/hr over 30 Minutes Intravenous Every 24 hours 06/01/22 0432 06/01/22 0801   06/01/22 0900  Ampicillin-Sulbactam (UNASYN) 3 g in sodium chloride 0.9 % 100 mL IVPB  Status:  Discontinued        3 g 200 mL/hr over 30 Minutes Intravenous Every 6 hours 06/01/22 0801 06/01/22 1302   06/01/22 0030  cefTRIAXone (ROCEPHIN) 1 g in sodium chloride 0.9 % 100 mL IVPB        1 g 200 mL/hr over 30 Minutes Intravenous  Once 06/01/22 0027 06/01/22 0155   06/01/22 0030  metroNIDAZOLE (FLAGYL) IVPB 500 mg  Status:  Discontinued        500 mg 100 mL/hr over 60 Minutes Intravenous Every 12 hours 06/01/22 0027 06/01/22 0801        Objective: Vitals:   06/07/22 1034 06/07/22 1045 06/07/22 1330 06/07/22 1330  BP: 111/67   (!) 148/59  Pulse: 72   91  Resp: '18 15 20 18  '$ Temp: 98.2 F (36.8 C)   97.7 F (36.5 C)  TempSrc: Oral   Oral  SpO2: 100% 100% 98% 98%  Weight:      Height:        Intake/Output Summary (Last 24 hours) at 06/07/2022 1414 Last data filed at 06/07/2022 1330 Gross per 24 hour  Intake 2608.69 ml  Output 1100 ml  Net 1508.69 ml   Filed  Weights   05/31/22 2156 06/01/22 0410 06/05/22 1154  Weight: 106.6 kg 115.3 kg 106.6 kg    Examination:  General exam: Appears calm and comfortable  Respiratory system: Clear to auscultation. Respiratory effort normal. No respiratory distress. No conversational dyspnea.  Cardiovascular system: S1 & S2 heard, RRR. No murmurs. No pedal edema. Gastrointestinal system: Abdomen is nondistended, soft and tender, particularly around incisions. Normal bowel sounds heard.  Ostomy bag present Central nervous system: Alert and oriented. No focal neurological deficits. Speech clear.  Extremities: Symmetric in appearance  Skin: No rashes, lesions or ulcers on exposed skin  Psychiatry: Judgement and insight appear normal. Mood & affect appropriate.   Data Reviewed: I have personally reviewed following labs and imaging studies  CBC: Recent Labs  Lab 06/01/22 0450 06/02/22 0428 06/03/22 0449 06/04/22 0413 06/05/22 0655 06/05/22 1505 06/06/22 0426 06/07/22 0436  WBC 13.5*   < > 13.9* 17.4* 29.6*  --  26.4* 22.1*  NEUTROABS 12.0*  --   --   --  26.8*  --  23.6*  --   HGB 13.6   < > 13.1 15.2* 13.7 12.6 11.2* 9.9*  HCT 41.6   < > 39.4 47.4* 41.4 37.0 34.6* 30.9*  MCV 88.7   < > 87.8 89.1 88.3  --  89.4 90.6  PLT 265   < > 358 466* 441*  --  376 366   < > = values in this interval not displayed.   Basic Metabolic Panel: Recent Labs  Lab 06/03/22 0449 06/04/22 0413 06/05/22 0655 06/05/22 1505 06/06/22 0426 06/07/22 0436 06/07/22 0822  NA 140 139 135 137 140 141  --   K 3.5 3.9 4.0 3.8 4.2 3.8  --   CL 108 108 100 101 108 105  --   CO2 '23 22 26  '$ --  25 31  --   GLUCOSE 123* 157* 143* 139* 149* 111*  --   BUN '6 8 17 12 15 14  '$ --   CREATININE 0.77 0.94 1.02* 0.70 0.76 0.57  --   CALCIUM 8.5* 8.2* 8.1*  --  7.8* 7.8*  --   MG 2.1 1.9 1.9  --  2.1 2.5*  --   PHOS 3.4 4.1 3.3  --  3.5 1.8* 2.1*   GFR: Estimated Creatinine Clearance: 102.5 mL/min (by C-G formula based on SCr of 0.57  mg/dL).  Liver Function Tests: Recent Labs  Lab 05/31/22 2224 06/01/22 0450 06/02/22 0428 06/03/22 0449 06/04/22 0413 06/05/22 0655 06/06/22 0426 06/07/22 0436  AST 14* 12*  --   --   --  8* 10* 16  ALT 15 13  --   --   --  $'10 9 13  'z$ ALKPHOS 83 70  --   --   --  58 39 56  BILITOT 0.8 0.7  --   --   --  0.8 0.8 0.6  PROT 7.6 6.7  --   --   --  6.3* 5.1* 5.5*  ALBUMIN 3.2* 3.0*   < > 2.9* 2.5* 2.4* 2.5* 2.4*   < > = values in this interval not displayed.   CBG: Recent Labs  Lab 06/06/22 0052 06/06/22 0804 06/06/22 1850 06/06/22 2339 06/07/22 0747  GLUCAP 155* 138* 118* 124* 96   Anemia Panel: Recent Labs    06/07/22 0436  VITAMINB12 435  FOLATE 8.6  FERRITIN 635*  TIBC 119*  IRON 20*  RETICCTPCT 1.6   Sepsis Labs: Recent Labs  Lab 06/04/22 1428 06/04/22 1641  LATICACIDVEN 1.8 1.7   Scheduled Meds:  Chlorhexidine Gluconate Cloth  6 each Topical Daily   enoxaparin (LOVENOX) injection  40 mg Subcutaneous Q24H   hydrocortisone cream   Topical QID   HYDROmorphone   Intravenous Q4H   metoprolol tartrate  2.5 mg Intravenous Q6H   Continuous Infusions:  lactated ringers 125 mL/hr at 06/07/22 1045   methocarbamol (ROBAXIN) IV     piperacillin-tazobactam (ZOSYN)  IV 3.375 g (06/07/22 1405)   promethazine (PHENERGAN) injection (IM or IVPB)       LOS: 6 days    Time spent: 30 minutes   Shelda Pal, DO [facility] 06/07/2022, 2:14 PM   Available via Epic secure chat 7am-7pm After these hours, please refer to coverage provider listed on [email or web address]

## 2022-06-08 DIAGNOSIS — R739 Hyperglycemia, unspecified: Secondary | ICD-10-CM | POA: Diagnosis not present

## 2022-06-08 DIAGNOSIS — D649 Anemia, unspecified: Secondary | ICD-10-CM | POA: Diagnosis not present

## 2022-06-08 DIAGNOSIS — E876 Hypokalemia: Secondary | ICD-10-CM | POA: Diagnosis not present

## 2022-06-08 DIAGNOSIS — R7303 Prediabetes: Secondary | ICD-10-CM

## 2022-06-08 LAB — COMPREHENSIVE METABOLIC PANEL
ALT: 24 U/L (ref 0–44)
AST: 53 U/L — ABNORMAL HIGH (ref 15–41)
Albumin: 2.1 g/dL — ABNORMAL LOW (ref 3.5–5.0)
Alkaline Phosphatase: 94 U/L (ref 38–126)
Anion gap: 5 (ref 5–15)
BUN: 11 mg/dL (ref 6–20)
CO2: 32 mmol/L (ref 22–32)
Calcium: 7.7 mg/dL — ABNORMAL LOW (ref 8.9–10.3)
Chloride: 102 mmol/L (ref 98–111)
Creatinine, Ser: 0.64 mg/dL (ref 0.44–1.00)
GFR, Estimated: >60 mL/min (ref >60)
Glucose, Bld: 98 mg/dL (ref 70–99)
Potassium: 3.8 mmol/L (ref 3.5–5.1)
Sodium: 139 mmol/L (ref 135–145)
Total Bilirubin: 0.7 mg/dL (ref 0.3–1.2)
Total Protein: 5.1 g/dL — ABNORMAL LOW (ref 6.5–8.1)

## 2022-06-08 LAB — CBC
HCT: 29 % — ABNORMAL LOW (ref 36.0–46.0)
Hemoglobin: 9.1 g/dL — ABNORMAL LOW (ref 12.0–15.0)
MCH: 28.7 pg (ref 26.0–34.0)
MCHC: 31.4 g/dL (ref 30.0–36.0)
MCV: 91.5 fL (ref 80.0–100.0)
Platelets: 303 10*3/uL (ref 150–400)
RBC: 3.17 MIL/uL — ABNORMAL LOW (ref 3.87–5.11)
RDW: 15.2 % (ref 11.5–15.5)
WBC: 16 10*3/uL — ABNORMAL HIGH (ref 4.0–10.5)
nRBC: 0.1 % (ref 0.0–0.2)

## 2022-06-08 LAB — GLUCOSE, CAPILLARY
Glucose-Capillary: 97 mg/dL (ref 70–99)
Glucose-Capillary: 95 mg/dL (ref 70–99)
Glucose-Capillary: 96 mg/dL (ref 70–99)

## 2022-06-08 NOTE — TOC Progression Note (Signed)
Transition of Care [facility]) - Progression Note    Patient Details  Name: Morgan Cline MRN: [id number] Date of Birth: 1971-04-25  [facility] of [facility]) CM/SW Contact  Leeroy Cha, RN Phone Number: 06/08/2022, 8:48 AM  Clinical Narrative:    [phone] chart reviewed.  Following for toc needs.  Plan is to return home with self-care at this time.        Expected Discharge Plan and Services                                                 Social Determinants of Health (SDOH) Interventions    Readmission Risk Interventions    06/01/2022   11:45 AM  Readmission Risk Prevention Plan  Post Dischage Appt Complete  Medication Screening Complete  Transportation Screening Complete

## 2022-06-08 NOTE — Progress Notes (Signed)
PROGRESS NOTE    Morgan Cline  YSA:[id number] DOB: 17-Sep-1971 DOA: 05/31/2022 [facility]CP: [facility]   Brief Narrative:  The patient is a morbidly obese African-American female with a past medical history significant for but not limited to prediabetes migraine headaches as well as other comorbidities who was seen in the ED 2 days prior to admission with sigmoid diverticulitis for which she was discharged on p.o. Augmentin.  She returned with worsening abdominal pain due to acute sigmoid diverticulitis despite taking oral antibiotics and in the ED she was noted to have stable vital signs.  She did have a mild leukocytosis and a CT of the abdomen pelvis done with contrast and showed increasing degree of sigmoid diverticulitis with evidence of new microperforations and free air as well as mild increase in free fluid in pelvis but no abscess.  Patient then had worsening abdominal pain and leukocytosis after interval improvement despite IV Zosyn she was placed on.  She had a repeat CT of the abdomen pelvis done which was performed and showed worsening perforated diverticulitis with abscess formation at this time and then showed increased dilatation of the proximal small bowel with increased mesenteric edema and area concerning for developing bowel obstruction.  Subsequently she underwent Hartman's procedure on 06/05/2022 by Dr. Barry Dienes and has an NG tube in place now.  She was placed on a PCA and her pain is relatively well controlled now  Assessment and Plan: No notes have been filed under this hospital service. Service: Hospitalist  Perforation of Sigmoid Colon due to Diverticulitis -Appreciate General Surgery evaluation and consultation -N.p.o. with NG tube and bowel rest, when ostomy fills with gas can hopefully remove as it would indicate with return of Bowel Fxn -Hopefully will work with PT -C/w Pain Control with Dilaudid PCA -Continue with Antiemetics with ondansetron 4 mg IV every 6 as  needed for nausea vomiting as well as promethazine 12.5 mg IV every 6 as needed for nausea vomiting -C/w IV Zosyn 3.375 g q8h -Patient's leukocytosis is improving and WBC is now trending down and went from 29.6 at its peak and is now 16.0 -Continue with LR at 125 MLS per hour and continue supportive care -General Surgery recommends VTE Prophylaxis with Enoxaparin 40 mg subcu every 24 and SCDS  Hyponatremia -Resolved -Patient's sodium is now 139 -To monitor and trend and repeat CMP in a.m.   Hypokalemia -Resolved -Patient's potassium is now 3.8 -Mag level was checked a few days ago and was 2.5 -Continue monitor and replete as necessary -Continue monitor and trend and repeat CMP in a.m.   Thrombocytosis  -Resolved -Patient's platelet count was elevated at 466 is now trending down and normalized to 303  Normocytic Anemia -Hemoglobin/hematocrit has now trended down from 15.2/47.4 and trended down to 9.1/29.0 -Could be a dilutional drop given that she is given IV fluid hydration -Patient is anemia, done and showed an iron level of 20, U IBC of 99, TIBC of 119, saturation ratios of 17%, ferritin level of 635, folate level of 8.6, vitamin B12 level of 435 -Continue monitor for signs and symptoms of bleeding; no overt bleeding noted -Repeat CBC in a.m.  Pre-Diabetes -Continue monitor blood sugars per protocol and they have been relatively well controlled and ranging from 95-124   Mildly Elevated AST -Patient's AST went from 16 is now 53 -Continue to monitor and trend hepatic function carefully and repeat CMP in a.m.  Hypoalbuminemia -Patient's albumin level has trended down from 2.4 is not 2.1 -  Continue monitor and trend and repeat CMP in the a.m.  Morbid Obesity -Complicates overall prognosis and care -Estimated body mass index is 40.34 kg/m as calculated from the following:   Height as of this encounter: '5\' 4"'$  (1.626 m).   Weight as of this encounter: 106.6 kg.  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), takedown of splenic flexure  Antimicrobials:  Anti-infectives (From admission, onward)    Start     Dose/Rate Route Frequency Ordered Stop   06/03/22 1400  piperacillin-tazobactam (ZOSYN) IVPB 3.375 g        3.375 g 12.5 mL/hr over 240 Minutes Intravenous Every 8 hours 06/03/22 0900     06/02/22 0000  cefTRIAXone (ROCEPHIN) 2 g in sodium chloride 0.9 % 100 mL IVPB  Status:  Discontinued        2 g 200 mL/hr over 30 Minutes Intravenous Every 24 hours 06/01/22 1302 06/03/22 0856   06/01/22 1400  metroNIDAZOLE (FLAGYL) IVPB 500 mg  Status:  Discontinued        500 mg 100 mL/hr over 60 Minutes Intravenous Every 12 hours 06/01/22 1302 06/03/22 0856   06/01/22 1200  cefTRIAXone (ROCEPHIN) 2 g in sodium chloride 0.9 % 100 mL IVPB  Status:  Discontinued        2 g 200 mL/hr over 30 Minutes Intravenous Every 24 hours 06/01/22 0432 06/01/22 0801   06/01/22 0900  Ampicillin-Sulbactam (UNASYN) 3 g in sodium chloride 0.9 % 100 mL IVPB  Status:  Discontinued        3 g 200 mL/hr over 30 Minutes Intravenous Every 6 hours 06/01/22 0801 06/01/22 1302   06/01/22 0030  cefTRIAXone (ROCEPHIN) 1 g in sodium chloride 0.9 % 100 mL IVPB        1 g 200 mL/hr over 30 Minutes Intravenous  Once 06/01/22 0027 06/01/22 0155   06/01/22 0030  metroNIDAZOLE (FLAGYL) IVPB 500 mg  Status:  Discontinued        500 mg 100 mL/hr over 60 Minutes Intravenous  Every 12 hours 06/01/22 0027 06/01/22 0801       Subjective: Seen and examined at bedside and her ostomy was changed this morning.  States that her pain is relatively well controlled on the PCA.  States that her NG tube is uncomfortable.  Denies any chest pain or shortness of breath.  No other concerns or complaints at this time.  Objective: Vitals:   06/07/22 2129 06/07/22 2347 06/08/22 0435 06/08/22 0538  BP: 103/73   94/69  Pulse: 90   99  Resp: 15 (!) '24 10 16  '$ Temp: 97.8 F (36.6 C)   98 F (36.7 C)  TempSrc: Oral   Oral  SpO2: 99% 99% 100% 100%  Weight:      Height:        Intake/Output Summary (Last 24 hours) at 06/08/2022 0900 Last data filed at 06/08/2022 0700 Gross per 24 hour  Intake 2572.06 ml  Output 1060 ml  Net 1512.06 ml   [employer]  05/31/22 2156 06/01/22 0410 06/05/22 1154  Weight: 106.6 kg 115.3 kg 106.6 kg   Examination: Physical Exam:  Constitutional: WN/WD morbidly obese African-American female currently no acute distress Respiratory: Diminished to auscultation bilaterally with coarse breath sounds, no wheezing, rales, rhonchi or crackles. Normal respiratory effort and patient is not tachypenic. No accessory muscle use.  Unlabored breathing Cardiovascular: RRR, no murmurs / rubs / gallops. S1 and S2 auscultated.  Minimal extremity edema Abdomen: Soft, has some tenderness to palpate has ostomy in place, distended second by habitus.  Bowel sounds are diminished.  GU: Deferred. Musculoskeletal: No clubbing / cyanosis of digits/nails. No joint deformity upper and lower extremities.  Neurologic: CN 2-12 grossly intact with no focal deficits.  Romberg sign and cerebellar reflexes not assessed.  Psychiatric: Normal judgment and insight. Alert and oriented x 3. Normal mood and appropriate affect.   Data Reviewed: I have personally reviewed following labs and imaging studies  CBC: Recent Labs  Lab 06/04/22 0413 06/05/22 0655 06/05/22 1505  06/06/22 0426 06/07/22 0436 06/08/22 0355  WBC 17.4* 29.6*  --  26.4* 22.1* 16.0*  NEUTROABS  --  26.8*  --  23.6*  --   --   HGB 15.2* 13.7 12.6 11.2* 9.9* 9.1*  HCT 47.4* 41.4 37.0 34.6* 30.9* 29.0*  MCV 89.1 88.3  --  89.4 90.6 91.5  PLT 466* 441*  --  376 366 109   Basic Metabolic Panel: Recent Labs  Lab 06/03/22 0449 06/04/22 0413 06/05/22 0655 06/05/22 1505 06/06/22 0426 06/07/22 0436 06/07/22 0822 06/08/22 0355  NA 140 139 135 137 140 141  --  139  K 3.5 3.9 4.0 3.8 4.2 3.8  --  3.8  CL 108 108 100 101 108 105  --  102  CO2 '23 22 26  '$ --  25 31  --  32  GLUCOSE 123* 157* 143* 139* 149* 111*  --  98  BUN '6 8 17 12 15 14  '$ --  11  CREATININE 0.77 0.94 1.02* 0.70 0.76 0.57  --  0.64  CALCIUM 8.5* 8.2* 8.1*  --  7.8* 7.8*  --  7.7*  MG 2.1 1.9 1.9  --  2.1 2.5*  --   --   PHOS 3.4 4.1 3.3  --  3.5 1.8* 2.1*  --    GFR: Estimated Creatinine Clearance: 102.5 mL/min (by C-G formula based on SCr of 0.64 mg/dL). Liver Function Tests: Recent Labs  Lab 06/04/22 0413 06/05/22 0655 06/06/22 0426 06/07/22 0436 06/08/22 0355  AST  --  8* 10* 16 53*  ALT  --  '10 9 13 24  '$ ALKPHOS  --  58 39 56 94  BILITOT  --  0.8 0.8 0.6 0.7  PROT  --  6.3* 5.1* 5.5* 5.1*  ALBUMIN 2.5* 2.4* 2.5* 2.4* 2.1*   No results for input(s): "LIPASE", "AMYLASE" in the last 168 hours. No results for input(s): "AMMONIA" in the last 168 hours. Coagulation Profile: No results for input(s): "INR", "PROTIME" in the last 168 hours. Cardiac Enzymes: No results for input(s): "CKTOTAL", "CKMB", "CKMBINDEX", "TROPONINI" in the last 168 hours. BNP (last 3 results) No results for input(s): "PROBNP" in the last 8760 hours. HbA1C: No results for input(s): "HGBA1C" in the last 72 hours. CBG: Recent Labs  Lab 06/06/22 2339 06/07/22 0747 06/07/22 1630 06/07/22 2341 06/08/22 0831  GLUCAP 124* 96 102* 105* 97   Lipid Profile: No results for input(s): "CHOL", "HDL", "LDLCALC", "TRIG", "CHOLHDL",  "LDLDIRECT" in the last 72 hours.  Thyroid Function Tests: No results for input(s): "TSH", "T4TOTAL", "FREET4", "T3FREE", "THYROIDAB" in the last 72 hours. Anemia Panel: Recent Labs    06/07/22 0436  VITAMINB12 435  FOLATE 8.6  FERRITIN 635*  TIBC 119*  IRON 20*  RETICCTPCT 1.6   Sepsis Labs: Recent Labs  Lab 06/04/22 1428 06/04/22 1641  LATICACIDVEN 1.8 1.7    No results found for this or any previous visit (from the past 240 hour(s)).   Radiology Studies: No results found.   Scheduled Meds:  Chlorhexidine Gluconate Cloth  6 each Topical Daily   enoxaparin (LOVENOX) injection  40 mg Subcutaneous Q24H   hydrocortisone cream   Topical QID   HYDROmorphone   Intravenous Q4H   metoprolol tartrate  2.5 mg Intravenous Q6H   Continuous Infusions:  lactated ringers 125 mL/hr at 06/08/22 0300   methocarbamol (ROBAXIN) IV     piperacillin-tazobactam (ZOSYN)  IV 3.375 g (06/08/22 0501)   promethazine (PHENERGAN) injection (IM or IVPB)      LOS: 7 days   Raiford Noble, DO [facility] Available via Epic secure chat 7am-7pm After these hours, please refer to coverage provider listed on [email or web address] 06/08/2022, 9:00 AM

## 2022-06-08 NOTE — Progress Notes (Signed)
3 Days Post-Op   Subjective/Chief Complaint: Complains of drainage from dressing   Objective: Vital signs in last 24 hours: Temp:  [97.7 F (36.5 C)-98.2 F (36.8 C)] 98 F (36.7 C) (07/03 0538) Pulse Rate:  [72-99] 99 (07/03 0538) Resp:  [10-24] 16 (07/03 0538) BP: (94-148)/(59-73) 94/69 (07/03 0538) SpO2:  [98 %-100 %] 100 % (07/03 0538) FiO2 (%):  [28 %] 28 % (07/03 0435) Last BM Date :  (PTA)  Intake/Output from previous day: 07/02 0701 - 07/03 0700 In: 2572.1 [I.V.:2442.3; IV Piggyback:129.8] Out: 1060 [Urine:800; Emesis/NG output:250; Stool:10] Intake/Output this shift: No intake/output data recorded.  General appearance: alert and cooperative Resp: clear to auscultation bilaterally Cardio: regular rate and rhythm GI: soft, mild tenderness. Wound clean. Ostomy no output yet  Lab Results:  Recent Labs    06/07/22 0436 06/08/22 0355  WBC 22.1* 16.0*  HGB 9.9* 9.1*  HCT 30.9* 29.0*  PLT 366 303   BMET Recent Labs    06/07/22 0436 06/08/22 0355  NA 141 139  K 3.8 3.8  CL 105 102  CO2 31 32  GLUCOSE 111* 98  BUN 14 11  CREATININE 0.57 0.64  CALCIUM 7.8* 7.7*   PT/INR No results for input(s): "LABPROT", "INR" in the last 72 hours. ABG No results for input(s): "PHART", "HCO3" in the last 72 hours.  Invalid input(s): "PCO2", "PO2"  Studies/Results: No results found.  Anti-infectives: Anti-infectives (From admission, onward)    Start     Dose/Rate Route Frequency Ordered Stop   06/03/22 1400  piperacillin-tazobactam (ZOSYN) IVPB 3.375 g        3.375 g 12.5 mL/hr over 240 Minutes Intravenous Every 8 hours 06/03/22 0900     06/02/22 0000  cefTRIAXone (ROCEPHIN) 2 g in sodium chloride 0.9 % 100 mL IVPB  Status:  Discontinued        2 g 200 mL/hr over 30 Minutes Intravenous Every 24 hours 06/01/22 1302 06/03/22 0856   06/01/22 1400  metroNIDAZOLE (FLAGYL) IVPB 500 mg  Status:  Discontinued        500 mg 100 mL/hr over 60 Minutes Intravenous Every  12 hours 06/01/22 1302 06/03/22 0856   06/01/22 1200  cefTRIAXone (ROCEPHIN) 2 g in sodium chloride 0.9 % 100 mL IVPB  Status:  Discontinued        2 g 200 mL/hr over 30 Minutes Intravenous Every 24 hours 06/01/22 0432 06/01/22 0801   06/01/22 0900  Ampicillin-Sulbactam (UNASYN) 3 g in sodium chloride 0.9 % 100 mL IVPB  Status:  Discontinued        3 g 200 mL/hr over 30 Minutes Intravenous Every 6 hours 06/01/22 0801 06/01/22 1302   06/01/22 0030  cefTRIAXone (ROCEPHIN) 1 g in sodium chloride 0.9 % 100 mL IVPB        1 g 200 mL/hr over 30 Minutes Intravenous  Once 06/01/22 0027 06/01/22 0155   06/01/22 0030  metroNIDAZOLE (FLAGYL) IVPB 500 mg  Status:  Discontinued        500 mg 100 mL/hr over 60 Minutes Intravenous Every 12 hours 06/01/22 0027 06/01/22 0801       Assessment/Plan: s/p Procedure(s): TOTAL COLECTOMY (N/A) Continue ng and bowel rest until bowel function returns Continue zosyn and flagyl VTE: lovenex and scd Start dressing changes Routine ostomy care POD 3 Hg stable  LOS: 7 days    Autumn Messing III 06/08/2022

## 2022-06-08 NOTE — Progress Notes (Signed)
Physical Therapy Treatment Patient Details Name: Morgan Cline MRN: [id number] DOB: October 22, 1971 Today's Date: 06/08/2022   History of Present Illness 51 year old female with PMH of morbid obesity, prediabetes and migraine headache returning with worsening abdominal pain due to acute sigmoid diverticulitis despite oral antibiotics.  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    PT Comments    POD # 3 Pt OOB in recliner.  Clamped NG.  Assisted with amb in hallway required increased time and care due to multiple lines.  General transfer comment: physically self able, required assist with multiple lines/leads/equipment.  General Gait Details: slow but functional gait increased c/o ABD pain with activity.  PCA x 2.  Used walker just for safety/recent ABD surgery. Returned to room then assisted on/off BSC.  Then positioned back in recliner to comfort.  Reapplied NG suction.   Pt plans to D/C to home.  No post Acute PT indicated per PT eval.    Recommendations for follow up therapy are one component of a multi-disciplinary discharge planning process, led by the attending physician.  Recommendations may be updated based on patient status, additional functional criteria and insurance authorization.  Follow Up Recommendations  No PT follow up     Assistance Recommended at Discharge PRN  Patient can return home with the following A little help with walking and/or transfers;A little help with bathing/dressing/bathroom;Help with stairs or ramp for entrance   Equipment Recommendations  None recommended by PT    Recommendations for Other Services       Precautions / Restrictions Precautions Precautions: Fall Precaution Comments: multiple lines, NG tube, colostomy,IV Restrictions Weight Bearing Restrictions: No     Mobility  Bed Mobility               General bed mobility comments: OOB in recliner    Transfers Overall  transfer level: Needs assistance Equipment used: Rolling walker (2 wheels) Transfers: Sit to/from Stand Sit to Stand: Supervision, Min guard           General transfer comment: physically self able, required assist with multiple lines/leads/equipment    Ambulation/Gait Ambulation/Gait assistance: Supervision, Min guard Gait Distance (Feet): 75 Feet Assistive device: Rolling walker (2 wheels) Gait Pattern/deviations: Step-through pattern, Decreased stride length, Trunk flexed Gait velocity: decreased     General Gait Details: slow but functional gait increased c/o ABD pain with activity.  PCA x 2.  Used walker just for safety/recent ABD surgery.   Stairs             Wheelchair Mobility    Modified Rankin (Stroke Patients Only)       Balance                                            Cognition Arousal/Alertness: Awake/alert Behavior During Therapy: WFL for tasks assessed/performed Overall Cognitive Status: Within Functional Limits for tasks assessed                                 General Comments: AxO x 3 very pleasant and willing.        Exercises      General Comments        Pertinent Vitals/Pain Pain Assessment Pain Assessment: Faces Faces Pain Scale: Hurts little more Pain Location: abdomen Pain  Descriptors / Indicators: Aching, Grimacing, Tender Pain Intervention(s): Monitored during session, PCA encouraged    Home Living                          Prior Function            PT Goals (current goals can now be found in the care plan section) Progress towards PT goals: Progressing toward goals    Frequency    Min 3X/week      PT Plan Current plan remains appropriate    Co-evaluation              AM-PAC PT "6 Clicks" Mobility   Outcome Measure  Help needed turning from your back to your side while in a flat bed without using bedrails?: A Lot Help needed moving from lying on your  back to sitting on the side of a flat bed without using bedrails?: A Lot Help needed moving to and from a bed to a chair (including a wheelchair)?: A Little Help needed standing up from a chair using your arms (e.g., wheelchair or bedside chair)?: A Little Help needed to walk in hospital room?: A Little Help needed climbing 3-5 steps with a railing? : A Lot 6 Click Score: 15    End of Session Equipment Utilized During Treatment: Gait belt Activity Tolerance: Patient tolerated treatment well Patient left: in chair;with call bell/phone within reach;with nursing/sitter in room Nurse Communication: Mobility status PT Visit Diagnosis: Other abnormalities of gait and mobility (R26.89)     Time: 9381-8299 PT Time Calculation (min) (ACUTE ONLY): 25 min  Charges:  $Gait Training: 8-22 mins $Therapeutic Activity: 8-22 mins                     {Cambree Hendrix  PTA Acute  [employer] M-F          [phone] Weekend pager [phone]

## 2022-06-08 NOTE — Plan of Care (Signed)
  Problem: Education: Goal: Knowledge of General Education information will improve Description Including pain rating scale, medication(s)/side effects and non-pharmacologic comfort measures Outcome: Progressing   Problem: Health Behavior/Discharge Planning: Goal: Ability to manage health-related needs will improve Outcome: Progressing   

## 2022-06-08 NOTE — Consult Note (Signed)
[place] Nurse ostomy follow up Patient receiving care in [place] [facility]. Stoma type/location: LUQ colostomy Stomal assessment/size: 1 3/8 to 1.5 inches; round, very slightly above skin level, moist, intact sutures Peristomal assessment: intact Treatment options for stomal/peristomal skin: barrier ring Output:  nothing yet Ostomy pouching: 1pc. Convex. 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 Patient observed every step of the removal, skin cleaning, and new pouch application process today while sitting in a recliner chair. Enrolled patient in [place] Discharge program: Yes  I also requested the [place] to order a chair pad as the patient was requesting one. That is Kellie Simmering [phone]. Val Riles, RN, MSN, CWOCN, CNS-BC, pager [phone]

## 2022-06-09 DIAGNOSIS — D649 Anemia, unspecified: Secondary | ICD-10-CM | POA: Diagnosis not present

## 2022-06-09 DIAGNOSIS — R739 Hyperglycemia, unspecified: Secondary | ICD-10-CM | POA: Diagnosis not present

## 2022-06-09 DIAGNOSIS — E876 Hypokalemia: Secondary | ICD-10-CM | POA: Diagnosis not present

## 2022-06-09 LAB — GLUCOSE, CAPILLARY
Glucose-Capillary: 101 mg/dL — ABNORMAL HIGH (ref 70–99)
Glucose-Capillary: 91 mg/dL (ref 70–99)
Glucose-Capillary: 91 mg/dL (ref 70–99)
Glucose-Capillary: 95 mg/dL (ref 70–99)

## 2022-06-09 LAB — CBC WITH DIFFERENTIAL/PLATELET
Abs Immature Granulocytes: 1.25 10*3/uL — ABNORMAL HIGH (ref 0.00–0.07)
Basophils Absolute: 0.1 10*3/uL (ref 0.0–0.1)
Basophils Relative: 0 %
Eosinophils Absolute: 0.3 10*3/uL (ref 0.0–0.5)
Eosinophils Relative: 2 %
HCT: 31.5 % — ABNORMAL LOW (ref 36.0–46.0)
Hemoglobin: 9.8 g/dL — ABNORMAL LOW (ref 12.0–15.0)
Immature Granulocytes: 8 %
Lymphocytes Relative: 9 %
Lymphs Abs: 1.6 10*3/uL (ref 0.7–4.0)
MCH: 28.9 pg (ref 26.0–34.0)
MCHC: 31.1 g/dL (ref 30.0–36.0)
MCV: 92.9 fL (ref 80.0–100.0)
Monocytes Absolute: 1.2 10*3/uL — ABNORMAL HIGH (ref 0.1–1.0)
Monocytes Relative: 7 %
Neutro Abs: 12.1 10*3/uL — ABNORMAL HIGH (ref 1.7–7.7)
Neutrophils Relative %: 74 %
Platelets: 317 10*3/uL (ref 150–400)
RBC: 3.39 MIL/uL — ABNORMAL LOW (ref 3.87–5.11)
RDW: 15.3 % (ref 11.5–15.5)
WBC: 16.5 10*3/uL — ABNORMAL HIGH (ref 4.0–10.5)
nRBC: 0.1 % (ref 0.0–0.2)

## 2022-06-09 LAB — COMPREHENSIVE METABOLIC PANEL
ALT: 36 U/L (ref 0–44)
AST: 47 U/L — ABNORMAL HIGH (ref 15–41)
Albumin: 2.1 g/dL — ABNORMAL LOW (ref 3.5–5.0)
Alkaline Phosphatase: 103 U/L (ref 38–126)
Anion gap: 8 (ref 5–15)
BUN: 9 mg/dL (ref 6–20)
CO2: 31 mmol/L (ref 22–32)
Calcium: 7.8 mg/dL — ABNORMAL LOW (ref 8.9–10.3)
Chloride: 101 mmol/L (ref 98–111)
Creatinine, Ser: 0.65 mg/dL (ref 0.44–1.00)
GFR, Estimated: >60 mL/min (ref >60)
Glucose, Bld: 86 mg/dL (ref 70–99)
Potassium: 4 mmol/L (ref 3.5–5.1)
Sodium: 140 mmol/L (ref 135–145)
Total Bilirubin: 0.7 mg/dL (ref 0.3–1.2)
Total Protein: 5.2 g/dL — ABNORMAL LOW (ref 6.5–8.1)

## 2022-06-09 LAB — PHOSPHORUS: Phosphorus: 3.7 mg/dL (ref 2.5–4.6)

## 2022-06-09 LAB — MAGNESIUM: Magnesium: 2.1 mg/dL (ref 1.7–2.4)

## 2022-06-09 NOTE — Progress Notes (Signed)
    Assessment & Plan: POD#4 - status post sigmoid colectomy with end colostomy (Hartmann's) - Dr. Barry Dienes 06/05/2022 Continue NG, IVF, NPO - await resolution of ileus Continue zosyn and flagyl Dressing changes to abdominal wound, WOC tending to stoma  WBC persistently elevated at 16.5 this AM  Encouraged OOB, ambulation        Armandina Gemma, MD [facility] [place] practice Office: [phone]        Chief Complaint: Perforated diverticulitis with abscess  Subjective: Patient in chair, some pain.  Objective: Vital signs in last 24 hours: Temp:  [97.8 F (36.6 C)-98.4 F (36.9 C)] 98.4 F (36.9 C) (07/04 0509) Pulse Rate:  [90-108] 108 (07/04 0509) Resp:  [11-24] 20 (07/04 0523) BP: (94-111)/(58-76) 111/76 (07/04 0509) SpO2:  [91 %-100 %] 92 % (07/04 0523) Last BM Date :  (PTA)  Intake/Output from previous day: 07/03 0701 - 07/04 0700 In: 3338.2 [I.V.:3188.2; IV Piggyback:150] Out: 1850 [Urine:600; Emesis/NG output:1250] Intake/Output this shift: No intake/output data recorded.  Physical Exam: HEENT - sclerae clear, mucous membranes moist Abdomen - soft, obese; mild diffuse tenderness; dressing intact - changed this AM; stoma ecchymotic, appears viable; no ostomy output; few BS on auscultation Neuro - alert & oriented, no focal deficits  Lab Results:  Recent Labs    06/08/22 0355 06/09/22 0409  WBC 16.0* 16.5*  HGB 9.1* 9.8*  HCT 29.0* 31.5*  PLT 303 317   BMET Recent Labs    06/08/22 0355 06/09/22 0409  NA 139 140  K 3.8 4.0  CL 102 101  CO2 32 31  GLUCOSE 98 86  BUN 11 9  CREATININE 0.64 0.65  CALCIUM 7.7* 7.8*   PT/INR No results for input(s): "LABPROT", "INR" in the last 72 hours. Comprehensive Metabolic Panel:    Component Value Date/Time   NA 140 06/09/2022 0409   NA 139 06/08/2022 0355   K 4.0 06/09/2022 0409   K 3.8 06/08/2022 0355   CL 101 06/09/2022 0409   CL 102 06/08/2022 0355   CO2 31 06/09/2022 0409   CO2 32  06/08/2022 0355   BUN 9 06/09/2022 0409   BUN 11 06/08/2022 0355   CREATININE 0.65 06/09/2022 0409   CREATININE 0.64 06/08/2022 0355   GLUCOSE 86 06/09/2022 0409   GLUCOSE 98 06/08/2022 0355   CALCIUM 7.8 (L) 06/09/2022 0409   CALCIUM 7.7 (L) 06/08/2022 0355   AST 47 (H) 06/09/2022 0409   AST 53 (H) 06/08/2022 0355   ALT 36 06/09/2022 0409   ALT 24 06/08/2022 0355   ALKPHOS 103 06/09/2022 0409   ALKPHOS 94 06/08/2022 0355   BILITOT 0.7 06/09/2022 0409   BILITOT 0.7 06/08/2022 0355   PROT 5.2 (L) 06/09/2022 0409   PROT 5.1 (L) 06/08/2022 0355   ALBUMIN 2.1 (L) 06/09/2022 0409   ALBUMIN 2.1 (L) 06/08/2022 0355    Studies/Results: No results found.    Armandina Gemma 06/09/2022   Patient ID: Morgan Cline, female   DOB: September 12, 1971, 51 y.o.   MRN: [id number]

## 2022-06-09 NOTE — Plan of Care (Signed)
  Problem: Clinical Measurements: Goal: Will remain free from infection Outcome: Progressing Goal: Diagnostic test results will improve Outcome: Progressing Goal: Respiratory complications will improve Outcome: Progressing Goal: Cardiovascular complication will be avoided Outcome: Progressing   

## 2022-06-09 NOTE — Plan of Care (Signed)
  Problem: Clinical Measurements: Goal: Diagnostic test results will improve Outcome: Progressing Goal: Respiratory complications will improve Outcome: Progressing Goal: Cardiovascular complication will be avoided Outcome: Progressing   Problem: Activity: Goal: Risk for activity intolerance will decrease Outcome: Progressing   Problem: Elimination: Goal: Will not experience complications related to bowel motility Outcome: Progressing   Problem: Pain Managment: Goal: General experience of comfort will improve Outcome: Progressing

## 2022-06-09 NOTE — Progress Notes (Signed)
PROGRESS NOTE    Morgan Cline  YSA:[id number] DOB: 1971-07-19 DOA: 05/31/2022 PCP: [facility]   Brief Narrative:  The patient is a morbidly obese African-American female with a past medical history significant for but not limited to prediabetes migraine headaches as well as other comorbidities who was seen in the ED 2 days prior to admission with sigmoid diverticulitis for which she was discharged on p.o. Augmentin.  She returned with worsening abdominal pain due to acute sigmoid diverticulitis despite taking oral antibiotics and in the ED she was noted to have stable vital signs.  She did have a mild leukocytosis and a CT of the abdomen pelvis done with contrast and showed increasing degree of sigmoid diverticulitis with evidence of new microperforations and free air as well as mild increase in free fluid in pelvis but no abscess.  Patient then had worsening abdominal pain and leukocytosis after interval improvement despite IV Zosyn she was placed on.  She had a repeat CT of the abdomen pelvis done which was performed and showed worsening perforated diverticulitis with abscess formation at this time and then showed increased dilatation of the proximal small bowel with increased mesenteric edema and area concerning for developing bowel obstruction.  Subsequently she underwent Hartman's procedure on 06/05/2022 by Dr. Barry Dienes and has an NG tube in place now.  She was placed on a PCA and her pain is relatively well controlled now however she is now complaining of a sore throat.  Assessment and Plan:  Perforation of Sigmoid Colon due to Diverticulitis -Appreciate General Surgery evaluation and consultation -N.p.o. with NG tube and bowel rest, when ostomy fills with gas can hopefully remove as it would indicate with return of Bowel Fxn -Hopefully will work with PT -C/w Pain Control with Dilaudid PCA -Continue with Antiemetics with ondansetron 4 mg IV every 6 as needed for nausea vomiting as  well as promethazine 12.5 mg IV every 6 as needed for nausea vomiting -C/w IV Zosyn 3.375 g q8h; Flagyl was discontinued about a week ago -Patient's leukocytosis is improving and WBC is now trending down and went from 29.6 at its peak and is now 16.0 yesterday and relatively stable at 16.5 today -Continued with LR at 125 MLS per hour  but will reduce rate to 75 mL/hr and continue supportive care -General Surgery recommends VTE Prophylaxis with Enoxaparin 40 mg subcu every 24 and SCDS   Hyponatremia -Resolved -Patient's sodium is now 139 yesterday and today is 140 -To monitor and trend and repeat CMP in a.m.   Hypokalemia -Resolved -Patient's potassium is now 4.0 -Mag level is now 2.1 -Continue monitor and replete as necessary -Continue monitor and trend and repeat CMP in a.m.   Thrombocytosis -Resolved -Patient's platelet count was elevated at 466 is now trending down and normalized to 303 yesterday and is now 317 -Continue to Monitor and Trend and Repeat CBC in the AM   Normocytic Anemia -Hemoglobin/hematocrit has now trended down from 15.2/47.4 and trended down to 9.1/29.0 yesterday and today is 9.8/31.5 -Could be a dilutional drop given that she is given IV fluid hydration -Patient is anemia, done and showed an iron level of 20, U IBC of 99, TIBC of 119, saturation ratios of 17%, ferritin level of 635, folate level of 8.6, vitamin B12 level of 435 -Continue monitor for signs and symptoms of bleeding; no overt bleeding noted -Repeat CBC in a.m.   Pre-Diabetes -Continue monitor blood sugars per protocol and they have been relatively well controlled and  ranging from 91-105   Mildly Elevated AST -Patient's AST went from 16 is now 53 and is trending down to 47 -Continue to monitor and trend hepatic function carefully and repeat CMP in a.m.   Hypoalbuminemia -Patient's albumin level has trended down from 2.4 is now 2.1 x2 -Continue monitor and trend and repeat CMP in the a.m.    Morbid Obesity -Complicates overall prognosis and care -Estimated body mass index is 40.34 kg/m as calculated from the following:   Height as of this encounter: '5\' 4"'$  (1.626 m).   Weight as of this encounter: 106.6 kg.  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), takedown of splenic flexure  Antimicrobials:  Anti-infectives (From admission, onward)    Start     Dose/Rate Route Frequency Ordered Stop   06/03/22 1400  piperacillin-tazobactam (ZOSYN) IVPB 3.375 g        3.375 g 12.5 mL/hr over 240 Minutes Intravenous Every 8 hours 06/03/22 0900     06/02/22 0000  cefTRIAXone (ROCEPHIN) 2 g in sodium chloride 0.9 % 100 mL IVPB  Status:  Discontinued        2 g 200 mL/hr over 30 Minutes Intravenous Every 24 hours 06/01/22 1302 06/03/22 0856   06/01/22 1400  metroNIDAZOLE (FLAGYL) IVPB 500 mg  Status:  Discontinued        500 mg 100 mL/hr over 60 Minutes Intravenous Every 12 hours 06/01/22 1302 06/03/22 0856   06/01/22 1200  cefTRIAXone (ROCEPHIN) 2 g in sodium chloride 0.9 % 100 mL IVPB  Status:  Discontinued        2 g 200 mL/hr over 30 Minutes Intravenous Every 24 hours 06/01/22 0432 06/01/22 0801   06/01/22 0900  Ampicillin-Sulbactam (UNASYN) 3 g in sodium chloride 0.9 % 100 mL IVPB  Status:  Discontinued        3 g 200 mL/hr over 30 Minutes Intravenous Every 6 hours 06/01/22 0801 06/01/22 1302   06/01/22 0030  cefTRIAXone (ROCEPHIN) 1 g in sodium  chloride 0.9 % 100 mL IVPB        1 g 200 mL/hr over 30 Minutes Intravenous  Once 06/01/22 0027 06/01/22 0155   06/01/22 0030  metroNIDAZOLE (FLAGYL) IVPB 500 mg  Status:  Discontinued        500 mg 100 mL/hr over 60 Minutes Intravenous Every 12 hours 06/01/22 0027 06/01/22 0801       Subjective: Seen and examined at bedside she is sitting in a chair and states that she did okay.  Was complaining about a sore throat.  States her pain is O2 well controlled.  She states that she has some slight gas in the bag.  Still had no bowel movement.  Denies any chest pain or shortness of breath.  No other concerns or plans at this time.  Objective: Vitals:   06/09/22 0018 06/09/22 0509 06/09/22 0523 06/09/22 0944  BP: (!) 94/59 111/76    Pulse:  (!) 108    Resp:  '18 20 18  '$ Temp:  98.4  F (36.9 C)    TempSrc:  Oral    SpO2:  92% 92% 92%  Weight:      Height:        Intake/Output Summary (Last 24 hours) at 06/09/2022 1157 Last data filed at 06/09/2022 1147 Gross per 24 hour  Intake 3021.22 ml  Output 1850 ml  Net 1171.22 ml   Filed Weights   05/31/22 2156 06/01/22 0410 06/05/22 1154  Weight: 106.6 kg 115.3 kg 106.6 kg   Examination: Physical Exam:  Constitutional: WN/WD morbidly obese AAF in NAD appears calm Respiratory: Diminished to auscultation bilaterally, no wheezing, rales, rhonchi or crackles. Normal respiratory effort and patient is not tachypenic. No accessory muscle use. Unlabored breathing Cardiovascular: RRR, no murmurs / rubs / gallops. S1 and S2 auscultated. Slight extremity edema.  Abdomen: Soft, has some tenderness and an ostomy in place with very slight air in the bag. Distended 2/2 body habitus. Bowel sounds positive.  GU: Deferred. Musculoskeletal: No clubbing / cyanosis of digits/nails. No joint deformity upper and lower extremities. Neurologic: CN 2-12 grossly intact with no focal deficits. Romberg sign and cerebellar reflexes not assessed.  Psychiatric: Normal  judgment and insight. Alert and oriented x 3. Normal mood and appropriate affect.   Data Reviewed: I have personally reviewed following labs and imaging studies  CBC: Recent Labs  Lab 06/05/22 0655 06/05/22 1505 06/06/22 0426 06/07/22 0436 06/08/22 0355 06/09/22 0409  WBC 29.6*  --  26.4* 22.1* 16.0* 16.5*  NEUTROABS 26.8*  --  23.6*  --   --  12.1*  HGB 13.7 12.6 11.2* 9.9* 9.1* 9.8*  HCT 41.4 37.0 34.6* 30.9* 29.0* 31.5*  MCV 88.3  --  89.4 90.6 91.5 92.9  PLT 441*  --  376 366 303 096   Basic Metabolic Panel: Recent Labs  Lab 06/04/22 0413 06/05/22 0655 06/05/22 1505 06/06/22 0426 06/07/22 0436 06/07/22 0822 06/08/22 0355 06/09/22 0409  NA 139 135 137 140 141  --  139 140  K 3.9 4.0 3.8 4.2 3.8  --  3.8 4.0  CL 108 100 101 108 105  --  102 101  CO2 22 26  --  25 31  --  32 31  GLUCOSE 157* 143* 139* 149* 111*  --  98 86  BUN '8 17 12 15 14  '$ --  11 9  CREATININE 0.94 1.02* 0.70 0.76 0.57  --  0.64 0.65  CALCIUM 8.2* 8.1*  --  7.8* 7.8*  --  7.7* 7.8*  MG 1.9 1.9  --  2.1 2.5*  --   --  2.1  PHOS 4.1 3.3  --  3.5 1.8* 2.1*  --  3.7   GFR: Estimated Creatinine Clearance: 102.5 mL/min (by C-G formula based on SCr of 0.65 mg/dL). Liver Function Tests: Recent Labs  Lab 06/05/22 0655 06/06/22 0426 06/07/22 0436 06/08/22 0355 06/09/22 0409  AST 8* 10* 16 53* 47*  ALT '10 9 13 24 '$ 36  ALKPHOS 58 39 56 94 103  BILITOT 0.8 0.8 0.6 0.7 0.7  PROT 6.3* 5.1* 5.5* 5.1* 5.2*  ALBUMIN 2.4* 2.5* 2.4* 2.1* 2.1*   No results for input(s): "LIPASE", "AMYLASE" in the last 168 hours. No results for input(s): "AMMONIA" in the last 168 hours. Coagulation Profile: No results for input(s): "INR", "PROTIME" in the last 168 hours. Cardiac Enzymes: No results for input(s): "CKTOTAL", "CKMB", "CKMBINDEX", "TROPONINI" in the last 168 hours. BNP (last 3 results) No results for input(s): "PROBNP" in the last 8760 hours. HbA1C:  No results for input(s): "HGBA1C" in the last 72  hours. CBG: Recent Labs  Lab 06/08/22 0831 06/08/22 1134 06/08/22 1643 06/09/22 0008 06/09/22 0734  GLUCAP 97 95 96 91 95   Lipid Profile: No results for input(s): "CHOL", "HDL", "LDLCALC", "TRIG", "CHOLHDL", "LDLDIRECT" in the last 72 hours. Thyroid Function Tests: No results for input(s): "TSH", "T4TOTAL", "FREET4", "T3FREE", "THYROIDAB" in the last 72 hours. Anemia Panel: Recent Labs    06/07/22 0436  VITAMINB12 435  FOLATE 8.6  FERRITIN 635*  TIBC 119*  IRON 20*  RETICCTPCT 1.6   Sepsis Labs: Recent Labs  Lab 06/04/22 1428 06/04/22 1641  LATICACIDVEN 1.8 1.7    No results found for this or any previous visit (from the past 240 hour(s)).   Radiology Studies: No results found.  Scheduled Meds:  Chlorhexidine Gluconate Cloth  6 each Topical Daily   enoxaparin (LOVENOX) injection  40 mg Subcutaneous Q24H   hydrocortisone cream   Topical QID   HYDROmorphone   Intravenous Q4H   metoprolol tartrate  2.5 mg Intravenous Q6H   Continuous Infusions:  lactated ringers 125 mL/hr at 06/09/22 1147   methocarbamol (ROBAXIN) IV     piperacillin-tazobactam (ZOSYN)  IV 3.375 g (06/09/22 0520)   promethazine (PHENERGAN) injection (IM or IVPB)      LOS: 8 days   Raiford Noble, DO [facility] Available via Epic secure chat 7am-7pm After these hours, please refer to coverage provider listed on [email or web address] 06/09/2022, 11:57 AM

## 2022-06-09 NOTE — Plan of Care (Signed)
  Problem: Education: Goal: Knowledge of General Education information will improve Description Including pain rating scale, medication(s)/side effects and non-pharmacologic comfort measures Outcome: Progressing   Problem: Health Behavior/Discharge Planning: Goal: Ability to manage health-related needs will improve Outcome: Progressing   

## 2022-06-09 NOTE — Consult Note (Signed)
[place] Nurse ostomy follow up Patient receiving care in [place] [facility]. Ostomy pouch placed yesterday remains in place. NGT remains in place. No new needs identified for today. Val Riles, RN, MSN, CWOCN, CNS-BC, pager [phone]

## 2022-06-10 ENCOUNTER — Inpatient Hospital Stay (HOSPITAL_COMMUNITY): Payer: Federal, State, Local not specified - PPO

## 2022-06-10 DIAGNOSIS — K5720 Diverticulitis of large intestine with perforation and abscess without bleeding: Secondary | ICD-10-CM | POA: Diagnosis not present

## 2022-06-10 LAB — CBC WITH DIFFERENTIAL/PLATELET
Abs Immature Granulocytes: 1.36 10*3/uL — ABNORMAL HIGH (ref 0.00–0.07)
Basophils Absolute: 0.1 10*3/uL (ref 0.0–0.1)
Basophils Relative: 1 %
Eosinophils Absolute: 0.3 10*3/uL (ref 0.0–0.5)
Eosinophils Relative: 1 %
HCT: 32.1 % — ABNORMAL LOW (ref 36.0–46.0)
Hemoglobin: 10.1 g/dL — ABNORMAL LOW (ref 12.0–15.0)
Immature Granulocytes: 7 %
Lymphocytes Relative: 9 %
Lymphs Abs: 1.9 10*3/uL (ref 0.7–4.0)
MCH: 28.5 pg (ref 26.0–34.0)
MCHC: 31.5 g/dL (ref 30.0–36.0)
MCV: 90.4 fL (ref 80.0–100.0)
Monocytes Absolute: 1.3 10*3/uL — ABNORMAL HIGH (ref 0.1–1.0)
Monocytes Relative: 7 %
Neutro Abs: 15.7 10*3/uL — ABNORMAL HIGH (ref 1.7–7.7)
Neutrophils Relative %: 75 %
Platelets: 359 10*3/uL (ref 150–400)
RBC: 3.55 MIL/uL — ABNORMAL LOW (ref 3.87–5.11)
RDW: 15 % (ref 11.5–15.5)
WBC: 20.7 10*3/uL — ABNORMAL HIGH (ref 4.0–10.5)
nRBC: 0 % (ref 0.0–0.2)

## 2022-06-10 LAB — SURGICAL PATHOLOGY

## 2022-06-10 LAB — COMPREHENSIVE METABOLIC PANEL
ALT: 35 U/L (ref 0–44)
AST: 33 U/L (ref 15–41)
Albumin: 2.2 g/dL — ABNORMAL LOW (ref 3.5–5.0)
Alkaline Phosphatase: 74 U/L (ref 38–126)
Anion gap: 9 (ref 5–15)
BUN: 7 mg/dL (ref 6–20)
CO2: 31 mmol/L (ref 22–32)
Calcium: 8.1 mg/dL — ABNORMAL LOW (ref 8.9–10.3)
Chloride: 98 mmol/L (ref 98–111)
Creatinine, Ser: 0.65 mg/dL (ref 0.44–1.00)
GFR, Estimated: >60 mL/min (ref >60)
Glucose, Bld: 103 mg/dL — ABNORMAL HIGH (ref 70–99)
Potassium: 3.8 mmol/L (ref 3.5–5.1)
Sodium: 138 mmol/L (ref 135–145)
Total Bilirubin: 0.8 mg/dL (ref 0.3–1.2)
Total Protein: 5.8 g/dL — ABNORMAL LOW (ref 6.5–8.1)

## 2022-06-10 LAB — GLUCOSE, CAPILLARY
Glucose-Capillary: 102 mg/dL — ABNORMAL HIGH (ref 70–99)
Glucose-Capillary: 110 mg/dL — ABNORMAL HIGH (ref 70–99)
Glucose-Capillary: 98 mg/dL (ref 70–99)

## 2022-06-10 LAB — MAGNESIUM: Magnesium: 2.1 mg/dL (ref 1.7–2.4)

## 2022-06-10 LAB — PHOSPHORUS: Phosphorus: 3.6 mg/dL (ref 2.5–4.6)

## 2022-06-10 MED ORDER — IOHEXOL 300 MG/ML  SOLN
100.0000 mL | Freq: Once | INTRAMUSCULAR | Status: AC | PRN
  Administered 2022-06-10: 100 mL via INTRAVENOUS

## 2022-06-10 NOTE — Progress Notes (Signed)
PT Cancellation Note  Patient Details Name: Morgan Cline MRN: [id number] DOB: 10-Nov-1971   Cancelled Treatment:    Reason Eval/Treat Not Completed: Fatigue/lethargy limiting ability to participate (pt stated she's too tired to walk at present, she was up very early and has had "a busy morning". Will follow.)  [name] PT 06/10/2022  Acute Rehabilitation Services  Office [phone]

## 2022-06-10 NOTE — Progress Notes (Signed)
PT Cancellation Note  Patient Details Name: Morgan Cline MRN: [id number] DOB: [birth year]   Cancelled Treatment:    Reason Eval/Treat Not Completed: Fatigue/lethargy limiting ability to participate (pt declined PT 2* fatigue. Will follow.)  [name] PT 06/10/2022  Acute Rehabilitation Services  Office [phone]

## 2022-06-10 NOTE — Progress Notes (Addendum)
    Assessment & Plan: POD#5 - status post sigmoid colectomy with end colostomy (Hartmann's) - Dr. Barry Cline 06/05/2022 Continue NG, IVF, NPO - await resolution of ileus Continue zosyn and flagyl Dressing changes to abdominal wound, WOC tending to stoma; I think she would benefit from wound VAC, will discuss with E. Lopez RN.  WBC up-trending 16 > 16.5 > 20.7; ordered CT abd/pelvis to evaluate for intra-abd abscess.   Encouraged OOB, ambulation        Morgan Cline, Morgan Cline [place] practice Office: [phone]        Chief Complaint: Perforated diverticulitis with abscess  Subjective: Patient in bed some pain. Thick bilious effluent from NG. No bowel function yet.   Objective: Vital signs in last 24 hours: Temp:  [97.8 F (36.6 C)-98.7 F (37.1 C)] 97.9 F (36.6 C) (07/05 0455) Pulse Rate:  [95-99] 99 (07/05 0455) Resp:  [14-20] 17 (07/05 1157) BP: (105-115)/(65-80) 115/65 (07/05 0455) SpO2:  [92 %-93 %] 93 % (07/05 1157) Last BM Date : 05/31/22  Intake/Output from previous day: 07/04 0701 - 07/05 0700 In: 2096.6 [I.V.:2096.6] Out: 1900 [Urine:400; Emesis/NG output:1500] Intake/Output this shift: Total I/O In: 0  Out: 300 [Urine:300]  Physical Exam: HEENT - sclerae clear, mucous membranes moist Abdomen - soft, obese; mild diffuse tenderness; dressing removed by me - fascia in tact - wound base is pink -  wound is partially closed with staples in the skin fold around the umbilicus. stoma ecchymotic, appears viable; no ostomy output Neuro - alert & oriented, no focal deficits  Lab Results:  Recent Labs    06/09/22 0409 06/10/22 0559  WBC 16.5* 20.7*  HGB 9.8* 10.1*  HCT 31.5* 32.1*  PLT 317 359   BMET Recent Labs    06/09/22 0409 06/10/22 0559  NA 140 138  K 4.0 3.8  CL 101 98  CO2 31 31  GLUCOSE 86 103*  BUN 9 7  CREATININE 0.65 0.65  CALCIUM 7.8* 8.1*   PT/INR No results for input(s): "LABPROT", "INR" in the last 72  hours. Comprehensive Metabolic Panel:    Component Value Date/Time   NA 138 06/10/2022 0559   NA 140 06/09/2022 0409   K 3.8 06/10/2022 0559   K 4.0 06/09/2022 0409   CL 98 06/10/2022 0559   CL 101 06/09/2022 0409   CO2 31 06/10/2022 0559   CO2 31 06/09/2022 0409   BUN 7 06/10/2022 0559   BUN 9 06/09/2022 0409   CREATININE 0.65 06/10/2022 0559   CREATININE 0.65 06/09/2022 0409   GLUCOSE 103 (H) 06/10/2022 0559   GLUCOSE 86 06/09/2022 0409   CALCIUM 8.1 (L) 06/10/2022 0559   CALCIUM 7.8 (L) 06/09/2022 0409   AST 33 06/10/2022 0559   AST 47 (H) 06/09/2022 0409   ALT 35 06/10/2022 0559   ALT 36 06/09/2022 0409   ALKPHOS 74 06/10/2022 0559   ALKPHOS 103 06/09/2022 0409   BILITOT 0.8 06/10/2022 0559   BILITOT 0.7 06/09/2022 0409   PROT 5.8 (L) 06/10/2022 0559   PROT 5.2 (L) 06/09/2022 0409   ALBUMIN 2.2 (L) 06/10/2022 0559   ALBUMIN 2.1 (L) 06/09/2022 0409    Studies/Results: No results found.    Morgan Cline Morgan Cline 06/10/2022   Patient ID: Morgan Cline, female   DOB: Oct 27, 1971, 51 y.o.   MRN: [id number]

## 2022-06-10 NOTE — Consult Note (Signed)
[place] Nurse ostomy follow up Stoma type/location:  35 mm (1 3/8")  LUQ colostomy Stomal assessment/size: 35 mm (patient measures and cuts pouch to fit)  Peristomal assessment:  Skin is red and tender around stoma.  She verbalizes some discomfort that she attributes to the convexity pressing into her.  We will replace with 1 piece flat pouch and barrier ring.  Obese abdomen with creasing above and below stoma Breakdown at 1 o'clock at adhesive edge, consistent with medical adhesive related skin injury.  This area is cleansed and is not in the new pouching area.  Midline surgical wound with dressing intact.  Treatment options for stomal/peristomal skin:  1 piece flat.  Convex pouches in the room also.  I demonstrate and explain the difference and she understands that if the flat pouch leaks, she may need to go back to a flexible convex system.  Output minimal soft brown stool Ostomy pouching: 1pc. Flat today due to skin irritation.   Education provided: see above, flat vs convex.  Her os points upward at 10 o'clock and stoma is slightly budded.   Abdominal edema improving.  She measures and cuts pouch to fit.  Able to apply pouch and roll closed.  She holds hand over pouch to warm and promote seal after application.  NG tube is in place.  Enrolled patient in [place] Start Discharge program: Yes previously I have provided flyer for outpatient ostomy clinic for further assistance after discharge if needed.  Will follow.  Domenic Moras MSN, RN, FNP-BC CWON Wound, Ostomy, Continence Nurse Pager [phone]

## 2022-06-10 NOTE — TOC Progression Note (Signed)
Transition of Care [facility]) - Progression Note    Patient Details  Name: Morgan Cline MRN: [id number] Date of Birth: 1971-01-04  Transition of [facility] CM/SW Contact  Leeroy Cha, RN Phone Number: 06/10/2022, 9:06 AM  Clinical Narrative:    [phone] chart reviewed.  Following for toc needs.  Plan is to return home with self-care at this time.        Expected Discharge Plan and Services                                                 Social Determinants of Health (SDOH) Interventions    Readmission Risk Interventions    06/01/2022   11:45 AM  Readmission Risk Prevention Plan  Post Dischage Appt Complete  Medication Screening Complete  Transportation Screening Complete

## 2022-06-10 NOTE — Progress Notes (Signed)
PROGRESS NOTE  Morgan Cline UMP:[id number] DOB: 03-12-1971 DOA: 05/31/2022 [facility]CP: [facility]   LOS: 9 days   Brief Narrative / Interim history: 51 year old female with history of prediabetes, obesity, comes to the hospital with abdominal pain.  She was recently seen in the ED couple days prior to admission with sigmoid diverticulitis, she was given Augmentin and sent home, however returned due to worsening pain.  A CT scan showed worsening perforated diverticulitis with abscess formation.  General surgery was consulted and eventually underwent Hartman's procedure on 6/30.  Subjective / 24h Interval events: Complains of persistent abdominal pain, has not passed any stool in her bag  Assesement and Plan: Principal Problem:   Perforation of sigmoid colon due to diverticulitis Active Problems:   Obesity, Class III, BMI 40-49.9 (morbid obesity) (HCC)   Prediabetes   Hyponatremia   Hypokalemia   Bandemia   Normocytic anemia   Principal problem Sigmoid colon perforation due to diverticulitis, multiple abscesses, postop ileus-appreciate general surgery follow-up.  She is status post sigmoid colectomy and end colostomy, takedown of splenic flexure by Dr. Barry Dienes on 6/30.  Appears to have persistent ileus, defer further management to surgery.  Continue pain control with Dilaudid PCA, switch to orals when able to take p.o.  Continue to closely monitor WBC, increasing to 20 K today.  Continue Zosyn  Active problems  Hyponatremia-Resolved   Hypokalemia -Resolved.  Monitor and replete as indicated   Thrombocytosis -Resolved  Normocytic Anemia -no bleeding, monitor  Pre-Diabetes-CBG stable  CBG (last 3)  Recent Labs    06/09/22 1702 06/09/22 2355 06/10/22 0729  GLUCAP 101* 91 102*   Obesity-BMI 40.  She would benefit from weight loss  Scheduled Meds:  enoxaparin (LOVENOX) injection  40 mg Subcutaneous Q24H   hydrocortisone cream   Topical QID   HYDROmorphone    Intravenous Q4H   metoprolol tartrate  2.5 mg Intravenous Q6H   Continuous Infusions:  lactated ringers 75 mL/hr at 06/09/22 1224   methocarbamol (ROBAXIN) IV     piperacillin-tazobactam (ZOSYN)  IV 3.375 g (06/10/22 0501)   promethazine (PHENERGAN) injection (IM or IVPB)     PRN Meds:.alum & mag hydroxide-simeth, menthol-cetylpyridinium, methocarbamol (ROBAXIN) IV, ondansetron (ZOFRAN) IV, mouth rinse, phenol, promethazine (PHENERGAN) injection (IM or IVPB)  Diet Orders (From admission, onward)     Start     Ordered   06/05/22 1734  Diet NPO time specified  Diet effective now        06/05/22 1733            DVT prophylaxis: enoxaparin (LOVENOX) injection 40 mg Start: 06/03/22 2200 SCDs Start: 06/01/22 0431   Lab Results  Component Value Date   PLT 359 06/10/2022      Code Status: Full Code  Family Communication: no family at bedside   Status is: Inpatient  Remains inpatient appropriate because: Persistent ileus  Level of care: Progressive  Consultants:  General surgery  Objective: Vitals:   06/09/22 2313 06/10/22 0332 06/10/22 0455 06/10/22 0727  BP:   115/65   Pulse:   99   Resp: '14 14 20 20  '$ Temp:   97.9 F (36.6 C)   TempSrc:   Oral   SpO2: 92% 92% 92% 93%  Weight:      Height:        Intake/Output Summary (Last 24 hours) at 06/10/2022 1138 Last data filed at 06/10/2022 1025 Gross per 24 hour  Intake 2096.57 ml  Output 2200 ml  Net -103.43  ml   Wt Readings from Last 3 Encounters:  06/05/22 106.6 kg  05/29/22 106.6 kg  04/30/21 120.2 kg    Examination:  Constitutional: NAD Eyes: no scleral icterus ENMT: Mucous membranes are moist.  Neck: normal, supple Respiratory: clear to auscultation bilaterally, no wheezing, no crackles.  Cardiovascular: Regular rate and rhythm, no murmurs / rubs / gallops. No edema Abdomen: non distended, no tenderness. Bowel sounds positive.  Musculoskeletal: no clubbing / cyanosis.  Skin: no rashes Neurologic:  nonfocal  Data Reviewed: I have independently reviewed following labs and imaging studies   CBC Recent Labs  Lab 06/05/22 0655 06/05/22 1505 06/06/22 0426 06/07/22 0436 06/08/22 0355 06/09/22 0409 06/10/22 0559  WBC 29.6*  --  26.4* 22.1* 16.0* 16.5* 20.7*  HGB 13.7   < > 11.2* 9.9* 9.1* 9.8* 10.1*  HCT 41.4   < > 34.6* 30.9* 29.0* 31.5* 32.1*  PLT 441*  --  376 366 303 317 359  MCV 88.3  --  89.4 90.6 91.5 92.9 90.4  MCH 29.2  --  28.9 29.0 28.7 28.9 28.5  MCHC 33.1  --  32.4 32.0 31.4 31.1 31.5  RDW 14.6  --  14.8 15.0 15.2 15.3 15.0  LYMPHSABS 0.8  --  0.9  --   --  1.6 1.9  MONOABS 1.6*  --  1.3*  --   --  1.2* 1.3*  EOSABS 0.0  --  0.0  --   --  0.3 0.3  BASOSABS 0.1  --  0.1  --   --  0.1 0.1   < > = values in this interval not displayed.    Recent Labs  Lab 06/04/22 0413 06/04/22 1428 06/04/22 1641 06/05/22 0655 06/05/22 1505 06/06/22 0426 06/07/22 0436 06/08/22 0355 06/09/22 0409 06/10/22 0559  NA  --   --   --  135   < > 140 141 139 140 138  K  --   --   --  4.0   < > 4.2 3.8 3.8 4.0 3.8  CL  --   --   --  100   < > 108 105 102 101 98  CO2  --   --   --  26  --  25 31 32 31 31  GLUCOSE  --   --   --  143*   < > 149* 111* 98 86 103*  BUN  --   --   --  17   < > '15 14 11 9 7  '$ CREATININE  --   --   --  1.02*   < > 0.76 0.57 0.64 0.65 0.65  CALCIUM  --   --   --  8.1*  --  7.8* 7.8* 7.7* 7.8* 8.1*  AST   < >  --   --  8*  --  10* 16 53* 47* 33  ALT   < >  --   --  10  --  '9 13 24 '$ 36 35  ALKPHOS   < >  --   --  58  --  39 56 94 103 74  BILITOT   < >  --   --  0.8  --  0.8 0.6 0.7 0.7 0.8  ALBUMIN  --   --   --  2.4*  --  2.5* 2.4* 2.1* 2.1* 2.2*  MG  --   --   --  1.9  --  2.1 2.5*  --  2.1 2.1  LATICACIDVEN  --  1.8 1.7  --   --   --   --   --   --   --    < > = values in this interval not displayed.    ------------------------------------------------------------------------------------------------------------------ No results for input(s): "CHOL",  "HDL", "LDLCALC", "TRIG", "CHOLHDL", "LDLDIRECT" in the last 72 hours.  Lab Results  Component Value Date   HGBA1C 5.6 06/01/2022   ------------------------------------------------------------------------------------------------------------------ No results for input(s): "TSH", "T4TOTAL", "T3FREE", "THYROIDAB" in the last 72 hours.  Invalid input(s): "FREET3"  Cardiac Enzymes No results for input(s): "CKMB", "TROPONINI", "MYOGLOBIN" in the last 168 hours.  Invalid input(s): "CK" ------------------------------------------------------------------------------------------------------------------ No results found for: "BNP"  CBG: Recent Labs  Lab 06/09/22 0008 06/09/22 0734 06/09/22 1702 06/09/22 2355 06/10/22 0729  GLUCAP 91 95 101* 91 102*    No results found for this or any previous visit (from the past 240 hour(s)).   Radiology Studies: No results found.   Marzetta Board, MD, PhD [facility]  Between 7 am - 7 pm I am available, please contact me via Amion (for emergencies) or Securechat (non urgent messages)  Between 7 pm - 7 am I am not available, please contact night coverage MD/APP via Amion

## 2022-06-11 ENCOUNTER — Other Ambulatory Visit: Payer: Self-pay | Admitting: Student

## 2022-06-11 ENCOUNTER — Inpatient Hospital Stay (HOSPITAL_COMMUNITY): Payer: Federal, State, Local not specified - PPO

## 2022-06-11 DIAGNOSIS — K5720 Diverticulitis of large intestine with perforation and abscess without bleeding: Secondary | ICD-10-CM | POA: Diagnosis not present

## 2022-06-11 LAB — COMPREHENSIVE METABOLIC PANEL
ALT: 26 U/L (ref 0–44)
AST: 21 U/L (ref 15–41)
Albumin: 2.2 g/dL — ABNORMAL LOW (ref 3.5–5.0)
Alkaline Phosphatase: 72 U/L (ref 38–126)
Anion gap: 8 (ref 5–15)
BUN: 5 mg/dL — ABNORMAL LOW (ref 6–20)
CO2: 28 mmol/L (ref 22–32)
Calcium: 8.1 mg/dL — ABNORMAL LOW (ref 8.9–10.3)
Chloride: 101 mmol/L (ref 98–111)
Creatinine, Ser: 0.55 mg/dL (ref 0.44–1.00)
GFR, Estimated: >60 mL/min (ref >60)
Glucose, Bld: 103 mg/dL — ABNORMAL HIGH (ref 70–99)
Potassium: 3.9 mmol/L (ref 3.5–5.1)
Sodium: 137 mmol/L (ref 135–145)
Total Bilirubin: 1 mg/dL (ref 0.3–1.2)
Total Protein: 5.5 g/dL — ABNORMAL LOW (ref 6.5–8.1)

## 2022-06-11 LAB — CBC
HCT: 28.2 % — ABNORMAL LOW (ref 36.0–46.0)
Hemoglobin: 9.2 g/dL — ABNORMAL LOW (ref 12.0–15.0)
MCH: 29.5 pg (ref 26.0–34.0)
MCHC: 32.6 g/dL (ref 30.0–36.0)
MCV: 90.4 fL (ref 80.0–100.0)
Platelets: 331 10*3/uL (ref 150–400)
RBC: 3.12 MIL/uL — ABNORMAL LOW (ref 3.87–5.11)
RDW: 15 % (ref 11.5–15.5)
WBC: 19.3 10*3/uL — ABNORMAL HIGH (ref 4.0–10.5)
nRBC: 0 % (ref 0.0–0.2)

## 2022-06-11 LAB — GLUCOSE, CAPILLARY
Glucose-Capillary: 92 mg/dL (ref 70–99)
Glucose-Capillary: 98 mg/dL (ref 70–99)

## 2022-06-11 MED ORDER — NALOXONE HCL 0.4 MG/ML IJ SOLN
INTRAMUSCULAR | Status: AC
  Filled 2022-06-11: qty 1

## 2022-06-11 MED ORDER — FENTANYL CITRATE (PF) 100 MCG/2ML IJ SOLN
INTRAMUSCULAR | Status: AC | PRN
  Administered 2022-06-11: 50 ug via INTRAVENOUS

## 2022-06-11 MED ORDER — FENTANYL CITRATE (PF) 100 MCG/2ML IJ SOLN
INTRAMUSCULAR | Status: AC
  Filled 2022-06-11: qty 4

## 2022-06-11 MED ORDER — MIDAZOLAM HCL 2 MG/2ML IJ SOLN
INTRAMUSCULAR | Status: AC | PRN
  Administered 2022-06-11: 1 mg via INTRAVENOUS

## 2022-06-11 MED ORDER — FLUMAZENIL 0.5 MG/5ML IV SOLN
INTRAVENOUS | Status: AC
  Filled 2022-06-11: qty 5

## 2022-06-11 MED ORDER — MIDAZOLAM HCL 2 MG/2ML IJ SOLN
INTRAMUSCULAR | Status: AC
  Filled 2022-06-11: qty 4

## 2022-06-11 NOTE — Progress Notes (Signed)
6 Days Post-Op   Subjective/Chief Complaint: Complains of soreness all over   Objective: Vital signs in last 24 hours: Temp:  [97.7 F (36.5 C)-98.6 F (37 C)] 97.7 F (36.5 C) (07/06 0500) Pulse Rate:  [87-98] 87 (07/06 0500) Resp:  [16-20] 18 (07/06 0820) BP: (110-119)/(65-78) 110/67 (07/06 0500) SpO2:  [93 %-98 %] 94 % (07/06 0820) FiO2 (%):  [0 %] 0 % (07/06 0500) Last BM Date : 05/31/22  Intake/Output from previous day: 07/05 0701 - 07/06 0700 In: 1141.8 [I.V.:1041.8; IV Piggyback:100] Out: 2250 [Urine:1100; Emesis/NG output:1150] Intake/Output this shift: No intake/output data recorded.  General appearance: alert and cooperative Resp: clear to auscultation bilaterally Cardio: regular rate and rhythm GI: soft, moderate tenderness. Ostomy viable with some air and stool in bag  Lab Results:  Recent Labs    06/10/22 0559 06/11/22 0432  WBC 20.7* 19.3*  HGB 10.1* 9.2*  HCT 32.1* 28.2*  PLT 359 331   BMET Recent Labs    06/10/22 0559 06/11/22 0432  NA 138 137  K 3.8 3.9  CL 98 101  CO2 31 28  GLUCOSE 103* 103*  BUN 7 5*  CREATININE 0.65 0.55  CALCIUM 8.1* 8.1*   PT/INR No results for input(s): "LABPROT", "INR" in the last 72 hours. ABG No results for input(s): "PHART", "HCO3" in the last 72 hours.  Invalid input(s): "PCO2", "PO2"  Studies/Results: CT ABDOMEN PELVIS W CONTRAST  Result Date: 06/10/2022 CLINICAL DATA:  Abdominal pain, postop day 6, suspect abscess, history of diverticulitis EXAM: CT ABDOMEN AND PELVIS WITH CONTRAST TECHNIQUE: Multidetector CT imaging of the abdomen and pelvis was performed using the standard protocol following bolus administration of intravenous contrast. RADIATION DOSE REDUCTION: This exam was performed according to the departmental dose-optimization program which includes automated exposure control, adjustment of the mA and/or kV according to patient size and/or use of iterative reconstruction technique. CONTRAST:  162m  OMNIPAQUE IOHEXOL 300 MG/ML  SOLN COMPARISON:  06/04/2022 FINDINGS: Lower chest: Hypoventilatory changes are seen at the lung bases. Small bilateral effusions, left greater than right. Hepatobiliary: No focal liver abnormality is seen. No gallstones, gallbladder wall thickening, or biliary dilatation. Pancreas: Unremarkable. No pancreatic ductal dilatation or surrounding inflammatory changes. Spleen: Normal in size without focal abnormality. Adrenals/Urinary Tract: The kidneys enhance normally and symmetrically. No urinary tract calculi or obstructive uropathy. The adrenals are unremarkable. Bladder is grossly normal. Stomach/Bowel: Postsurgical changes are seen from distal colectomy with diverting colostomy left lower quadrant. No bowel obstruction or ileus. Normal appendix right lower quadrant. Enteric catheter identified, tip within the proximal duodenum. Vascular/Lymphatic: No significant vascular findings are present. No enlarged abdominal or pelvic lymph nodes. Reproductive: Stable uterine fibroid and IUD.  No adnexal masses. Other: There is free fluid throughout the abdomen and pelvis. Fluid within the left lower quadrant and upper pelvis is partially loculated, without rim enhancement to suggest organized abscess at this time. Small amount of free intraperitoneal gas consistent with recent surgical intervention. Postsurgical changes related to midline laparotomy, with surgical packing in place. Musculoskeletal: No acute or destructive bony lesions. Reconstructed images demonstrate no additional findings. IMPRESSION: 1. Free fluid throughout the abdomen and pelvis. Fluid in the left lower quadrant and upper pelvis is partially loculated, without rim enhancement to suggest organized abscess at this time. Continued follow-up is recommended. 2. Postsurgical changes from midline laparotomy, distal colectomy, and diverting colostomy within the left lower quadrant. 3. Small bilateral pleural effusions, left greater  than right, with bibasilar hypoventilatory changes. 4. Stable uterine  fibroid. Electronically Signed   By: Randa Ngo M.D.   On: 06/10/2022 19:45    Anti-infectives: Anti-infectives (From admission, onward)    Start     Dose/Rate Route Frequency Ordered Stop   06/03/22 1400  piperacillin-tazobactam (ZOSYN) IVPB 3.375 g        3.375 g 12.5 mL/hr over 240 Minutes Intravenous Every 8 hours 06/03/22 0900     06/02/22 0000  cefTRIAXone (ROCEPHIN) 2 g in sodium chloride 0.9 % 100 mL IVPB  Status:  Discontinued        2 g 200 mL/hr over 30 Minutes Intravenous Every 24 hours 06/01/22 1302 06/03/22 0856   06/01/22 1400  metroNIDAZOLE (FLAGYL) IVPB 500 mg  Status:  Discontinued        500 mg 100 mL/hr over 60 Minutes Intravenous Every 12 hours 06/01/22 1302 06/03/22 0856   06/01/22 1200  cefTRIAXone (ROCEPHIN) 2 g in sodium chloride 0.9 % 100 mL IVPB  Status:  Discontinued        2 g 200 mL/hr over 30 Minutes Intravenous Every 24 hours 06/01/22 0432 06/01/22 0801   06/01/22 0900  Ampicillin-Sulbactam (UNASYN) 3 g in sodium chloride 0.9 % 100 mL IVPB  Status:  Discontinued        3 g 200 mL/hr over 30 Minutes Intravenous Every 6 hours 06/01/22 0801 06/01/22 1302   06/01/22 0030  cefTRIAXone (ROCEPHIN) 1 g in sodium chloride 0.9 % 100 mL IVPB        1 g 200 mL/hr over 30 Minutes Intravenous  Once 06/01/22 0027 06/01/22 0155   06/01/22 0030  metroNIDAZOLE (FLAGYL) IVPB 500 mg  Status:  Discontinued        500 mg 100 mL/hr over 60 Minutes Intravenous Every 12 hours 06/01/22 0027 06/01/22 0801       Assessment/Plan: s/p Procedure(s): TOTAL COLECTOMY (N/A) Advance diet. Will allow clears and try clamping ng Continue IV vanc and zosyn Consider vac for subcutaneous wound Ambulate POD 6 Intraabdominal fluid on CT. With elevated wbc will ask IR to drain   LOS: 10 days    Autumn Messing III 06/11/2022

## 2022-06-11 NOTE — Consult Note (Signed)
WOC Nurse Consult Note: Patient receiving care in [place] [facility]36. Reason for Consult: VAC placement to abdominal surgical wound After obtaining VAC supplies and coordinating a time to place the VAC to the abdominal wound, I arrived on the unit to find the patient being transported off the unit for a CT. Depending on when the patient returns from the procedure, the [facility] may be placed tomorrow morning. I explained this to the patient and her nurse.  The [facility] supplies are in her room, and her nurse Sherlynn Stalls will send me a Secure Chat to let me know when she returns. Val Riles, RN, MSN, CWOCN, CNS-BC, pager [phone]

## 2022-06-11 NOTE — Progress Notes (Signed)
PT Cancellation Note  Patient Details Name: Morgan Cline MRN: [id number] DOB: 01-10-1971   Cancelled Treatment:     Pt out of room at [facility] for a procedure.  Will attempt to see another day as schedule permits.  Rica Koyanagi  PTA Acute  Rehabilitation Services Office M-F          [phone] Weekend pager [phone]

## 2022-06-11 NOTE — Procedures (Signed)
Interventional Radiology Procedure Note  Procedure: CT guided abdominal drain placement  Findings: Please refer to procedural dictation for full description. LUQ 10.2 Fr pigtail drain placed in paracolic fluid collection yielding approximately 75 mL serosanguinous fluid.  Sample sent for culture.  Placed to bulb suction.  Complications: None immediate  Estimated Blood Loss: < 5 mL  Recommendations: Keep to bulb suction for now. Follow up cultures. IR will follow.   Ruthann Cancer, MD

## 2022-06-11 NOTE — Progress Notes (Signed)
PROGRESS NOTE  Morgan Cline QQP:[id number] DOB: Mar 03, 1971 DOA: 05/31/2022 PCP: [facility]   LOS: 10 days   Brief Narrative / Interim history: 51 year old female with history of prediabetes, obesity, comes to the hospital with abdominal pain.  She was recently seen in the ED couple days prior to admission with sigmoid diverticulitis, she was given Augmentin and sent home, however returned due to worsening pain.  A CT scan showed worsening perforated diverticulitis with abscess formation.  General surgery was consulted and eventually underwent Hartman's procedure on 6/30.  Subjective / 24h Interval events: She is not feeling good this morning.  Persistent abdominal pain.  No nausea or vomiting  Assesement and Plan: Principal Problem:   Perforation of sigmoid colon due to diverticulitis Active Problems:   Obesity, Class III, BMI 40-49.9 (morbid obesity) (HCC)   Prediabetes   Hyponatremia   Hypokalemia   Bandemia   Normocytic anemia   Principal problem Sigmoid colon perforation due to diverticulitis, multiple abscesses, postop ileus-appreciate general surgery follow-up.  She is status post sigmoid colectomy and end colostomy, takedown of splenic flexure by Dr. Barry Dienes on 6/30.  Postop course complicated by ileus but now seems to be improving.  Diet is being advanced by surgery this morning -Given WBC elevation underwent a CT scan of the abdomen and pelvis yesterday which showed left-sided fluid collection.  IR consulted today for drainage.  Continue antibiotics  Active problems  Hyponatremia-sodium remains normal this morning   Hypokalemia -potassium remains normal this morning.  Monitor and replete as indicated   Thrombocytosis -platelets have normalized  Normocytic Anemia -no bleeding, monitor  Pre-Diabetes-CBG stable  CBG (last 3)  Recent Labs    06/10/22 1718 06/10/22 2348 06/11/22 0736  GLUCAP 98 110* 92    Obesity-BMI 40.  She would benefit from  weight loss  Scheduled Meds:  enoxaparin (LOVENOX) injection  40 mg Subcutaneous Q24H   hydrocortisone cream   Topical QID   HYDROmorphone   Intravenous Q4H   Continuous Infusions:  lactated ringers 75 mL/hr at 06/11/22 0510   methocarbamol (ROBAXIN) IV     piperacillin-tazobactam (ZOSYN)  IV 3.375 g (06/11/22 0530)   promethazine (PHENERGAN) injection (IM or IVPB)     PRN Meds:.alum & mag hydroxide-simeth, menthol-cetylpyridinium, methocarbamol (ROBAXIN) IV, ondansetron (ZOFRAN) IV, mouth rinse, phenol, promethazine (PHENERGAN) injection (IM or IVPB)  Diet Orders (From admission, onward)     Start     Ordered   06/11/22 0936  Diet clear liquid Room service appropriate? Yes; Fluid consistency: Thin  Diet effective now       Question Answer Comment  Room service appropriate? Yes   Fluid consistency: Thin      06/11/22 0935            DVT prophylaxis: enoxaparin (LOVENOX) injection 40 mg Start: 06/03/22 2200 SCDs Start: 06/01/22 0431   Lab Results  Component Value Date   PLT 331 06/11/2022      Code Status: Full Code  Family Communication: no family at bedside   Status is: Inpatient  Remains inpatient appropriate because: Persistent ileus  Level of care: Progressive  Consultants:  General surgery  Objective: Vitals:   06/11/22 0050 06/11/22 0056 06/11/22 0500 06/11/22 0820  BP: 114/65  110/67   Pulse:   87   Resp: '20 20 18 18  '$ Temp:   97.7 F (36.5 C)   TempSrc:   Oral   SpO2: 96% 96% 95% 94%  Weight:      Height:  Intake/Output Summary (Last 24 hours) at 06/11/2022 1011 Last data filed at 06/11/2022 0530 Gross per 24 hour  Intake 1141.75 ml  Output 2250 ml  Net -1108.25 ml    Wt Readings from Last 3 Encounters:  06/05/22 106.6 kg  05/29/22 106.6 kg  04/30/21 120.2 kg    Examination:  Constitutional: NAD Eyes: lids and conjunctivae normal, no scleral icterus ENMT: mmm Neck: normal, supple Respiratory: clear to auscultation  bilaterally, no wheezing, no crackles. Normal respiratory effort.  Cardiovascular: Regular rate and rhythm, no murmurs / rubs / gallops. No LE edema. Abdomen: soft, no distention, no tenderness. Bowel sounds positive.  Skin: no rashes Neurologic: no focal deficits, equal strength  Data Reviewed: I have independently reviewed following labs and imaging studies   CBC Recent Labs  Lab 06/05/22 0655 06/05/22 1505 06/06/22 0426 06/07/22 0436 06/08/22 0355 06/09/22 0409 06/10/22 0559 06/11/22 0432  WBC 29.6*  --  26.4* 22.1* 16.0* 16.5* 20.7* 19.3*  HGB 13.7   < > 11.2* 9.9* 9.1* 9.8* 10.1* 9.2*  HCT 41.4   < > 34.6* 30.9* 29.0* 31.5* 32.1* 28.2*  PLT 441*  --  376 366 303 317 359 331  MCV 88.3  --  89.4 90.6 91.5 92.9 90.4 90.4  MCH 29.2  --  28.9 29.0 28.7 28.9 28.5 29.5  MCHC 33.1  --  32.4 32.0 31.4 31.1 31.5 32.6  RDW 14.6  --  14.8 15.0 15.2 15.3 15.0 15.0  LYMPHSABS 0.8  --  0.9  --   --  1.6 1.9  --   MONOABS 1.6*  --  1.3*  --   --  1.2* 1.3*  --   EOSABS 0.0  --  0.0  --   --  0.3 0.3  --   BASOSABS 0.1  --  0.1  --   --  0.1 0.1  --    < > = values in this interval not displayed.     Recent Labs  Lab  0000 06/04/22 1428 06/04/22 1641 06/05/22 2947 06/05/22 1505 06/06/22 0426 06/07/22 0436 06/08/22 0355 06/09/22 0409 06/10/22 0559 06/11/22 0432  NA  --   --   --  135   < > 140 141 139 140 138 137  K  --   --   --  4.0   < > 4.2 3.8 3.8 4.0 3.8 3.9  CL  --   --   --  100   < > 108 105 102 101 98 101  CO2   < >  --   --  26  --  25 31 32 '31 31 28  '$ GLUCOSE  --   --   --  143*   < > 149* 111* 98 86 103* 103*  BUN  --   --   --  17   < > '15 14 11 9 7 '$ 5*  CREATININE  --   --   --  1.02*   < > 0.76 0.57 0.64 0.65 0.65 0.55  CALCIUM   < >  --   --  8.1*  --  7.8* 7.8* 7.7* 7.8* 8.1* 8.1*  AST   < >  --   --  8*  --  10* 16 53* 47* 33 21  ALT   < >  --   --  10  --  '9 13 24 '$ 36 35 26  ALKPHOS   < >  --   --  58  --  39 56 94 103 74 72  BILITOT   < >  --   --  0.8   --  0.8 0.6 0.7 0.7 0.8 1.0  ALBUMIN   < >  --   --  2.4*  --  2.5* 2.4* 2.1* 2.1* 2.2* 2.2*  MG  --   --   --  1.9  --  2.1 2.5*  --  2.1 2.1  --   LATICACIDVEN  --  1.8 1.7  --   --   --   --   --   --   --   --    < > = values in this interval not displayed.     ------------------------------------------------------------------------------------------------------------------ No results for input(s): "CHOL", "HDL", "LDLCALC", "TRIG", "CHOLHDL", "LDLDIRECT" in the last 72 hours.  Lab Results  Component Value Date   HGBA1C 5.6 06/01/2022   ------------------------------------------------------------------------------------------------------------------ No results for input(s): "TSH", "T4TOTAL", "T3FREE", "THYROIDAB" in the last 72 hours.  Invalid input(s): "FREET3"  Cardiac Enzymes No results for input(s): "CKMB", "TROPONINI", "MYOGLOBIN" in the last 168 hours.  Invalid input(s): "CK" ------------------------------------------------------------------------------------------------------------------ No results found for: "BNP"  CBG: Recent Labs  Lab 06/09/22 2355 06/10/22 0729 06/10/22 1718 06/10/22 2348 06/11/22 0736  GLUCAP 91 102* 98 110* 92     No results found for this or any previous visit (from the past 240 hour(s)).   Radiology Studies: CT ABDOMEN PELVIS W CONTRAST  Result Date: 06/10/2022 CLINICAL DATA:  Abdominal pain, postop day 6, suspect abscess, history of diverticulitis EXAM: CT ABDOMEN AND PELVIS WITH CONTRAST TECHNIQUE: Multidetector CT imaging of the abdomen and pelvis was performed using the standard protocol following bolus administration of intravenous contrast. RADIATION DOSE REDUCTION: This exam was performed according to the departmental dose-optimization program which includes automated exposure control, adjustment of the mA and/or kV according to patient size and/or use of iterative reconstruction technique. CONTRAST:  131m OMNIPAQUE IOHEXOL 300  MG/ML  SOLN COMPARISON:  06/04/2022 FINDINGS: Lower chest: Hypoventilatory changes are seen at the lung bases. Small bilateral effusions, left greater than right. Hepatobiliary: No focal liver abnormality is seen. No gallstones, gallbladder wall thickening, or biliary dilatation. Pancreas: Unremarkable. No pancreatic ductal dilatation or surrounding inflammatory changes. Spleen: Normal in size without focal abnormality. Adrenals/Urinary Tract: The kidneys enhance normally and symmetrically. No urinary tract calculi or obstructive uropathy. The adrenals are unremarkable. Bladder is grossly normal. Stomach/Bowel: Postsurgical changes are seen from distal colectomy with diverting colostomy left lower quadrant. No bowel obstruction or ileus. Normal appendix right lower quadrant. Enteric catheter identified, tip within the proximal duodenum. Vascular/Lymphatic: No significant vascular findings are present. No enlarged abdominal or pelvic lymph nodes. Reproductive: Stable uterine fibroid and IUD.  No adnexal masses. Other: There is free fluid throughout the abdomen and pelvis. Fluid within the left lower quadrant and upper pelvis is partially loculated, without rim enhancement to suggest organized abscess at this time. Small amount of free intraperitoneal gas consistent with recent surgical intervention. Postsurgical changes related to midline laparotomy, with surgical packing in place. Musculoskeletal: No acute or destructive bony lesions. Reconstructed images demonstrate no additional findings. IMPRESSION: 1. Free fluid throughout the abdomen and pelvis. Fluid in the left lower quadrant and upper pelvis is partially loculated, without rim enhancement to suggest organized abscess at this time. Continued follow-up is recommended. 2. Postsurgical changes from midline laparotomy, distal colectomy, and diverting colostomy within the left lower quadrant. 3. Small bilateral pleural effusions, left greater than right, with  bibasilar hypoventilatory changes. 4. Stable  uterine fibroid. Electronically Signed   By: Randa Ngo M.D.   On: 06/10/2022 19:45     Marzetta Board, MD, PhD [facility]  Between 7 am - 7 pm I am available, please contact me via Amion (for emergencies) or Securechat (non urgent messages)  Between 7 pm - 7 am I am not available, please contact night coverage MD/APP via Amion

## 2022-06-11 NOTE — Consult Note (Signed)
Chief Complaint: Patient was seen in consultation today for image-guided left paracolic aspiration with possible drain placement Chief Complaint  Patient presents with   Abdominal Pain    Referring Physician(s): Obie Dredge, PA-C  Supervising Physician: Ruthann Cancer  Patient Status: [facility] - In-pt  History of Present Illness: Morgan Cline is a 51 y.o. female with PMH of arthritis and obesity who presented to ED on 6/25 with worsening abdominal pain. She was found to have sigmoid diverticulitis with microperforation. She was admitted and started on IV antibiotics at that time. Patient underwent a Hartmann's procedure on 6/30 by Dr Barry Dienes and has an NG tube in place. Follow-up CT Abdomen & Pelvis w Contrast on 7/5 revealed free fluid in the abdomen and pelvis with partial loculation present in the left lower quadrant. Impression is below. IR was consulted at this time to evaluate for possible aspiration and drain placement.   CT Abdomen & Pelvis w Contrast 7/5 IMPRESSION: 1. Free fluid throughout the abdomen and pelvis. Fluid in the left lower quadrant and upper pelvis is partially loculated, without rim enhancement to suggest organized abscess at this time. Continued follow-up is recommended. 2. Postsurgical changes from midline laparotomy, distal colectomy, and diverting colostomy within the left lower quadrant. 3. Small bilateral pleural effusions, left greater than right, with bibasilar hypoventilatory changes. 4. Stable uterine fibroid.  Past Medical History:  Diagnosis Date   Arthritis    Obesity     Past Surgical History:  Procedure Laterality Date   CESAREAN SECTION     COLECTOMY N/A 06/05/2022   Procedure: TOTAL COLECTOMY;  Surgeon: Stark Klein, MD;  Location: [facility];  Service: General;  Laterality: N/A;   KNEE SURGERY     SHOULDER SURGERY      Allergies: Codeine, Compazine [prochlorperazine], and Pineapple  Medications: Prior to Admission  medications   Medication Sig Start Date End Date Taking? Authorizing Provider  amoxicillin-clavulanate (AUGMENTIN) 875-125 MG tablet Take 1 tablet by mouth every 12 (twelve) hours. 05/29/22  Yes Jeanell Sparrow, DO  Aspirin-Salicylamide-Caffeine (BC HEADACHE POWDER PO) Take 1 packet by mouth daily as needed (headaches).   Yes [provider]  diphenhydramine-acetaminophen (TYLENOL PM) 25-500 MG TABS tablet Take 1 tablet by mouth at bedtime as needed (sleep).   Yes [provider]  Fiber Select Gummies CHEW Chew 3 tablets by mouth at bedtime.   Yes [provider]  HYDROcodone-acetaminophen (NORCO) 5-325 MG tablet Take 1 tablet by mouth every 6 (six) hours as needed for moderate pain. 05/29/22  Yes Wynona Dove A, DO  ibuprofen (ADVIL) 600 MG tablet Take 1 tablet (600 mg total) by mouth every 6 (six) hours as needed. Patient taking differently: Take 600 mg by mouth every 6 (six) hours as needed (pain). 05/29/22  Yes Jeanell Sparrow, DO  levonorgestrel (MIRENA) 20 MCG/24HR IUD 1 each by Intrauterine route once. Implanted September 2020   Yes [provider]  ondansetron (ZOFRAN) 4 MG tablet Take 1 tablet (4 mg total) by mouth every 4 (four) hours as needed for nausea or vomiting. 05/29/22  Yes Jeanell Sparrow, DO  Semaglutide, 1 MG/DOSE, (OZEMPIC, 1 MG/DOSE,) 4 MG/3ML SOPN Inject 1 mg into the skin every Sunday.   Yes [provider]     Family History  Problem Relation Age of Onset   Diabetes Paternal Grandfather    Diabetes Paternal Grandmother    Diabetes Maternal Grandmother    Diabetes Maternal Grandfather     Social History  Socioeconomic History   Marital status: Single    Spouse name: Not on file   Number of children: Not on file   Years of education: Not on file   Highest education level: Not on file  Occupational History   Not on file  Tobacco Use   Smoking status: Never   Smokeless tobacco: Never  Substance and Sexual Activity    Alcohol use: Yes    Comment: Social   Drug use: No   Sexual activity: Yes    Birth control/protection: I.U.D.  Other Topics Concern   Not on file  Social History Narrative   Not on file   Social Determinants of Health   Financial Resource Strain: Not on file  Food Insecurity: Not on file  Transportation Needs: Not on file  Physical Activity: Not on file  Stress: Not on file  Social Connections: Not on file      Review of Systems: A 12 point ROS discussed and pertinent positives are indicated in the HPI above.  All other systems are negative.  Review of Systems  Constitutional:  Positive for fatigue. Negative for chills and fever.  Respiratory:  Negative for chest tightness and shortness of breath.   Cardiovascular:  Negative for chest pain.  Gastrointestinal:  Positive for abdominal pain. Negative for diarrhea, nausea and vomiting.  Neurological:  Negative for dizziness, light-headedness and headaches.  Psychiatric/Behavioral:  Negative for confusion.     Vital Signs: BP 110/67 (BP Location: Left Arm)   Pulse 87   Temp 97.7 F (36.5 C) (Oral)   Resp 18   Ht '5\' 4"'$  (1.626 m)   Wt 235 lb (106.6 kg)   SpO2 94%   BMI 40.34 kg/m     Physical Exam Vitals reviewed.  Constitutional:      General: She is not in acute distress.    Appearance: She is obese. She is ill-appearing.  HENT:     Mouth/Throat:     Mouth: Mucous membranes are moist.  Cardiovascular:     Rate and Rhythm: Normal rate and regular rhythm.     Heart sounds: Normal heart sounds.  Pulmonary:     Effort: Pulmonary effort is normal.     Breath sounds: Normal breath sounds.  Abdominal:     Tenderness: There is abdominal tenderness. There is no guarding.  Musculoskeletal:     Right lower leg: No edema.     Left lower leg: No edema.  Skin:    General: Skin is warm and dry.  Neurological:     Mental Status: She is alert and oriented to person, place, and time.  Psychiatric:        Mood and  Affect: Mood normal.        Behavior: Behavior normal.     Imaging: CT ABDOMEN PELVIS W CONTRAST  Result Date: 06/10/2022 CLINICAL DATA:  Abdominal pain, postop day 6, suspect abscess, history of diverticulitis EXAM: CT ABDOMEN AND PELVIS WITH CONTRAST TECHNIQUE: Multidetector CT imaging of the abdomen and pelvis was performed using the standard protocol following bolus administration of intravenous contrast. RADIATION DOSE REDUCTION: This exam was performed according to the departmental dose-optimization program which includes automated exposure control, adjustment of the mA and/or kV according to patient size and/or use of iterative reconstruction technique. CONTRAST:  164m OMNIPAQUE IOHEXOL 300 MG/ML  SOLN COMPARISON:  06/04/2022 FINDINGS: Lower chest: Hypoventilatory changes are seen at the lung bases. Small bilateral effusions, left greater than right. Hepatobiliary: No focal liver abnormality is seen.  No gallstones, gallbladder wall thickening, or biliary dilatation. Pancreas: Unremarkable. No pancreatic ductal dilatation or surrounding inflammatory changes. Spleen: Normal in size without focal abnormality. Adrenals/Urinary Tract: The kidneys enhance normally and symmetrically. No urinary tract calculi or obstructive uropathy. The adrenals are unremarkable. Bladder is grossly normal. Stomach/Bowel: Postsurgical changes are seen from distal colectomy with diverting colostomy left lower quadrant. No bowel obstruction or ileus. Normal appendix right lower quadrant. Enteric catheter identified, tip within the proximal duodenum. Vascular/Lymphatic: No significant vascular findings are present. No enlarged abdominal or pelvic lymph nodes. Reproductive: Stable uterine fibroid and IUD.  No adnexal masses. Other: There is free fluid throughout the abdomen and pelvis. Fluid within the left lower quadrant and upper pelvis is partially loculated, without rim enhancement to suggest organized abscess at this time.  Small amount of free intraperitoneal gas consistent with recent surgical intervention. Postsurgical changes related to midline laparotomy, with surgical packing in place. Musculoskeletal: No acute or destructive bony lesions. Reconstructed images demonstrate no additional findings. IMPRESSION: 1. Free fluid throughout the abdomen and pelvis. Fluid in the left lower quadrant and upper pelvis is partially loculated, without rim enhancement to suggest organized abscess at this time. Continued follow-up is recommended. 2. Postsurgical changes from midline laparotomy, distal colectomy, and diverting colostomy within the left lower quadrant. 3. Small bilateral pleural effusions, left greater than right, with bibasilar hypoventilatory changes. 4. Stable uterine fibroid. Electronically Signed   By: Randa Ngo M.D.   On: 06/10/2022 19:45   CT ABDOMEN PELVIS W CONTRAST  Result Date: 06/04/2022 CLINICAL DATA:  Evaluate diverticulitis.  Complications suspected. EXAM: CT ABDOMEN AND PELVIS WITH CONTRAST TECHNIQUE: Multidetector CT imaging of the abdomen and pelvis was performed using the standard protocol following bolus administration of intravenous contrast. RADIATION DOSE REDUCTION: This exam was performed according to the departmental dose-optimization program which includes automated exposure control, adjustment of the mA and/or kV according to patient size and/or use of iterative reconstruction technique. CONTRAST:  145m OMNIPAQUE IOHEXOL 300 MG/ML  SOLN COMPARISON:  CT abdomen and pelvis 05/31/2022 FINDINGS: Lower chest: Compressive atelectasis at both lower lobes. Volume loss at the lung bases has progressed since 05/31/2022. Trace pleural fluid bilaterally. Hepatobiliary: Increased perihepatic ascites. Small amount of free air along the anterior aspect of the liver. Layering sludge or stones in the gallbladder. No discrete hepatic lesion. No intrahepatic or extrahepatic biliary dilatation. Pancreas:  Unremarkable. No pancreatic ductal dilatation or surrounding inflammatory changes. Spleen: Normal in size without focal abnormality. Adrenals/Urinary Tract: Normal adrenal glands. Small hypodensity in the posterior left kidney upper pole likely represents a cyst and this not require dedicated follow-up. No hydronephrosis. No suspicious renal lesions. Urinary bladder is decompressed. Stomach/Bowel: Again noted are colonic diverticula in the proximal sigmoid colon with some inflammatory changes in this area. There may be another prominent colonic diverticulum along the left side of the colon on sequence 2 image 40 versus a small pericolonic fluid collection. Increased dilatation of small bowel loops in the left upper abdomen. These dilated loops of small bowel contain oral contrast. Distal small bowel loops are decompressed. In addition, there may be focal narrowing along the left side of the transverse colon best seen on sequence 2 image 35 and this represents an interval change. This area of colonic narrowing is adjacent to the dilated loops of small bowel. Increased mesenteric edema particularly associated with the area of small bowel dilatation. Vascular/Lymphatic: Vascular structures are unremarkable. Normal caliber of the abdominal aorta. Portal venous system is patent. There is  no significant lymph node enlargement in the abdomen or pelvis. Reproductive: Again noted is a hyperdense or enhancing large structure associated with the uterus that measures 6.2 cm and likely represents a large uterine fibroid. Again noted is an IUD within the uterus. Limited evaluation of the adnexa. Other: Markedly increased mesenteric edema throughout the abdomen. Again noted are small pockets of free air in the abdomen which have slightly increased. There are multiple small pockets of fluid in the abdomen or pelvis which have progressed. Fluid in the cul-de-sac measures 3.0 x 4.7 cm. Increased fluid in the right lower quadrant on  sequence 2 image 70 and increased focus of fluid in the anterior lower abdomen on image 71. Increased fluid in the right paracolic gutter and around the liver. Question a focus of fluid with gas versus a prominent diverticulum along left colon on image 39. Increased loculated / irregular fluid in the lower left paracolic gutter. Musculoskeletal: No acute bone abnormality. IMPRESSION: 1. Worsening inflammatory process throughout the abdomen and pelvis. Increased ascites throughout the abdomen with multiple small fluid pockets. There small fluid pockets concerning for developing abscess collections throughout the abdomen and pelvis. In addition, there is slightly increased free air within the abdomen. Findings are compatible with the sequelae of perforated diverticulitis. Source of the diverticulitis is probably from the proximal sigmoid colon but less conspicuous compared to the previous CT examination. 2. Increased dilatation of proximal small bowel loops with increased mesenteric edema in this area. There is also focal narrowing of the transverse colon adjacent to the small bowel dilatation. These findings raise concern for a developing bowel obstruction. 3. Trace pleural effusions with increased atelectasis at the lung bases. 4. Gallbladder sludge and/or stones. 5. Prominent uterine fibroid. These results were called by telephone at the time of interpretation on 06/04/2022 at 12:37 pm to provider [facility] , who verbally acknowledged these results. Electronically Signed   By: Markus Daft M.D.   On: 06/04/2022 12:41   DG Abd Portable 1V  Result Date: 06/01/2022 CLINICAL DATA:  Abdominal pain EXAM: PORTABLE ABDOMEN - 1 VIEW COMPARISON:  None Available. FINDINGS: Examination is technically difficult due to the patient's body habitus. As far as seen bowel gas pattern is nonspecific. No abnormal masses or calcifications are seen. IUD is seen in the pelvis. Degenerative changes are noted in the lumbar spine.  IMPRESSION: Nonspecific bowel gas pattern.  Lumbar spondylosis. Electronically Signed   By: Elmer Picker M.D.   On: 06/01/2022 11:36   CT ABDOMEN PELVIS W CONTRAST  Result Date: 06/01/2022 CLINICAL DATA:  Acute abdominal pain, history of diverticulitis EXAM: CT ABDOMEN AND PELVIS WITH CONTRAST TECHNIQUE: Multidetector CT imaging of the abdomen and pelvis was performed using the standard protocol following bolus administration of intravenous contrast. RADIATION DOSE REDUCTION: This exam was performed according to the departmental dose-optimization program which includes automated exposure control, adjustment of the mA and/or kV according to patient size and/or use of iterative reconstruction technique. CONTRAST:  136m OMNIPAQUE IOHEXOL 300 MG/ML  SOLN COMPARISON:  05/29/2022 FINDINGS: Lower chest: Mild atelectatic changes are noted in the bases. Hepatobiliary: No focal liver abnormality is seen. No gallstones, gallbladder wall thickening, or biliary dilatation. Pancreas: Unremarkable. No pancreatic ductal dilatation or surrounding inflammatory changes. Spleen: Normal in size without focal abnormality. Adrenals/Urinary Tract: Adrenal glands are within normal limits. Kidneys demonstrate a normal enhancement pattern bilaterally. Small nonobstructing left renal stone is noted. The ureters are within normal limits. The bladder is well distended. Stomach/Bowel: Colon again  demonstrates changes of sigmoid diverticulitis. The degree of inflammatory change has increased in the interval from the prior exam. Multiple small foci of free air are noted within the abdomen consistent with micro perforations. This is consistent with the progressive inflammatory change. More proximal colon is unremarkable. The appendix is within normal limits. Small bowel and stomach are within normal limits. Vascular/Lymphatic: No significant vascular findings are present. No enlarged abdominal or pelvic lymph nodes. Reproductive: Large  uterine fibroid is again identified. IUD is noted in place. Other: Small fat containing umbilical hernia is noted. New free fluid is noted in the pelvis likely related to the perforations. Musculoskeletal: Degenerative changes of lumbar spine are noted. No acute bony abnormality is seen. IMPRESSION: Increase in the degree of sigmoid diverticulitis with evidence of new micro perforations and free air. Mild increase in free fluid is noted within the pelvis. No abscess is seen at this time. Dominant fibroid with IUD in place. Left renal calculus without obstructive change. Electronically Signed   By: Inez Catalina M.D.   On: 06/01/2022 00:16   CT Abdomen Pelvis W Contrast  Result Date: 05/29/2022 CLINICAL DATA:  LLQ abdominal pain EXAM: CT ABDOMEN AND PELVIS WITH CONTRAST TECHNIQUE: Multidetector CT imaging of the abdomen and pelvis was performed using the standard protocol following bolus administration of intravenous contrast. RADIATION DOSE REDUCTION: This exam was performed according to the departmental dose-optimization program which includes automated exposure control, adjustment of the mA and/or kV according to patient size and/or use of iterative reconstruction technique. CONTRAST:  168m OMNIPAQUE IOHEXOL 300 MG/ML  SOLN COMPARISON:  None Available. FINDINGS: Lower chest: No acute abnormality. Hepatobiliary: No focal liver abnormality is seen. No gallstones, gallbladder wall thickening, or biliary dilatation. Pancreas: Unremarkable. No pancreatic ductal dilatation or surrounding inflammatory changes. Spleen: Normal in size without focal abnormality. Adrenals/Urinary Tract: Adrenal glands are unremarkable. Kidneys are normal, without renal calculi, focal lesion, or hydronephrosis except for couple of 2-3 mm hypodense areas at the upper pole of the left kidney likely cysts. Bladder is unremarkable. Stomach/Bowel: Stomach and small intestines have a normal appearance. Moderate diverticulosis of the descending  sigmoid colon. There is approximately 8 cm segment of severe sigmoid colonic wall thickening and pericolonic inflammatory stranding seen (images 64/5, 52 through 59 of series 2) consistent with diverticulitis. No fluid or abscess. Appendix is normal. Vascular/Lymphatic: No significant vascular findings are present. No enlarged abdominal or pelvic lymph nodes. Reproductive: There is a large intramural fibroid at the left fundus/body of the uterus measuring approximally 8.4 x 5.5 by 5.5 cm causing right and posterior displacement of the intrauterine device. Other: Small umbilical hernia containing fat. Musculoskeletal: No acute or significant osseous findings. IMPRESSION: Approximately 8 cm segment of mid sigmoid colonic diverticulitis with severe sigmoid colonic wall thickening and pericolonic stranding as described above. No pericolonic fluid or abscess seen. Large uterine fibroid at the left fundus/body of the uterus causing right and posterior displacement of the IUD. Electronically Signed   By: AFrazier RichardsM.D.   On: 05/29/2022 07:41    Labs:  CBC: Recent Labs    06/08/22 0355 06/09/22 0409 06/10/22 0559 06/11/22 0432  WBC 16.0* 16.5* 20.7* 19.3*  HGB 9.1* 9.8* 10.1* 9.2*  HCT 29.0* 31.5* 32.1* 28.2*  PLT 303 317 359 331    COAGS: No results for input(s): "INR", "APTT" in the last 8760 hours.  BMP: Recent Labs    06/08/22 0355 06/09/22 0409 06/10/22 0559 06/11/22 0432  NA 139 140 138 137  K 3.8 4.0 3.8 3.9  CL 102 101 98 101  CO2 32 '31 31 28  '$ GLUCOSE 98 86 103* 103*  BUN '11 9 7 '$ 5*  CALCIUM 7.7* 7.8* 8.1* 8.1*  CREATININE 0.64 0.65 0.65 0.55  GFRNONAA >60 >60 >60 >60    LIVER FUNCTION TESTS: Recent Labs    06/08/22 0355 06/09/22 0409 06/10/22 0559 06/11/22 0432  BILITOT 0.7 0.7 0.8 1.0  AST 53* 47* 33 21  ALT 24 36 35 26  ALKPHOS 94 103 74 72  PROT 5.1* 5.2* 5.8* 5.5*  ALBUMIN 2.1* 2.1* 2.2* 2.2*    TUMOR MARKERS: No results for input(s): "AFPTM", "CEA",  "CA199", "CHROMGRNA" in the last 8760 hours.  Assessment and Plan:  Permelia Bamba is a 51 yo female admitted for sigmoid diverticulitis with microperforation who is being evaluated today for an abdominal and pelvic fluid collection. Dr Serafina Royals has reviewed imaging and approved patient for left sided paracolic fluid aspiration with possible drain placement tentatively for 7/6.  Risks and benefits discussed with the patient including bleeding, infection, damage to adjacent structures, bowel perforation/fistula connection, and sepsis.  All of the patient's questions were answered, patient is agreeable to proceed. Consent signed and in IR suite.   Thank you for this interesting consult.  I greatly enjoyed meeting Darling Cieslewicz Wingrove and look forward to participating in their care.  A copy of this report was sent to the requesting provider on this date.  Electronically Signed: Lura Em, PA-C 06/11/2022, 9:42 AM   I spent a total of 20 Minutes    in face to face in clinical consultation, greater than 50% of which was counseling/coordinating care for image-guided left-sided fluid aspiration and possible drain placement.

## 2022-06-12 DIAGNOSIS — K5720 Diverticulitis of large intestine with perforation and abscess without bleeding: Secondary | ICD-10-CM | POA: Diagnosis not present

## 2022-06-12 LAB — CBC
HCT: 29.1 % — ABNORMAL LOW (ref 36.0–46.0)
Hemoglobin: 9.3 g/dL — ABNORMAL LOW (ref 12.0–15.0)
MCH: 28.8 pg (ref 26.0–34.0)
MCHC: 32 g/dL (ref 30.0–36.0)
MCV: 90.1 fL (ref 80.0–100.0)
Platelets: 387 10*3/uL (ref 150–400)
RBC: 3.23 MIL/uL — ABNORMAL LOW (ref 3.87–5.11)
RDW: 14.8 % (ref 11.5–15.5)
WBC: 15.1 10*3/uL — ABNORMAL HIGH (ref 4.0–10.5)
nRBC: 0 % (ref 0.0–0.2)

## 2022-06-12 LAB — GLUCOSE, CAPILLARY
Glucose-Capillary: 104 mg/dL — ABNORMAL HIGH (ref 70–99)
Glucose-Capillary: 112 mg/dL — ABNORMAL HIGH (ref 70–99)
Glucose-Capillary: 122 mg/dL — ABNORMAL HIGH (ref 70–99)

## 2022-06-12 LAB — COMPREHENSIVE METABOLIC PANEL
ALT: 23 U/L (ref 0–44)
AST: 15 U/L (ref 15–41)
Albumin: 2.1 g/dL — ABNORMAL LOW (ref 3.5–5.0)
Alkaline Phosphatase: 76 U/L (ref 38–126)
Anion gap: 9 (ref 5–15)
BUN: <5 mg/dL — ABNORMAL LOW (ref 6–20)
CO2: 28 mmol/L (ref 22–32)
Calcium: 8.2 mg/dL — ABNORMAL LOW (ref 8.9–10.3)
Chloride: 102 mmol/L (ref 98–111)
Creatinine, Ser: 0.6 mg/dL (ref 0.44–1.00)
GFR, Estimated: >60 mL/min (ref >60)
Glucose, Bld: 107 mg/dL — ABNORMAL HIGH (ref 70–99)
Potassium: 3.8 mmol/L (ref 3.5–5.1)
Sodium: 139 mmol/L (ref 135–145)
Total Bilirubin: 1.1 mg/dL (ref 0.3–1.2)
Total Protein: 5.7 g/dL — ABNORMAL LOW (ref 6.5–8.1)

## 2022-06-12 LAB — MAGNESIUM: Magnesium: 2 mg/dL (ref 1.7–2.4)

## 2022-06-12 MED ORDER — ENSURE ENLIVE PO LIQD
237.0000 mL | Freq: Two times a day (BID) | ORAL | Status: DC
  Administered 2022-06-13 – 2022-07-01 (×20): 237 mL via ORAL

## 2022-06-12 MED ORDER — SODIUM CHLORIDE 0.9% FLUSH
5.0000 mL | Freq: Three times a day (TID) | INTRAVENOUS | Status: DC
  Administered 2022-06-12 – 2022-06-24 (×35): 5 mL

## 2022-06-12 NOTE — Progress Notes (Signed)
PROGRESS NOTE  Morgan Cline ZHY:[id number] DOB: 03/21/1971 DOA: 05/31/2022 PCP: [facility]   LOS: 11 days   Brief Narrative / Interim history: 51 year old female with history of prediabetes, obesity, comes to the hospital with abdominal pain.  She was recently seen in the ED couple days prior to admission with sigmoid diverticulitis, she was given Augmentin and sent home, however returned due to worsening pain.  A CT scan showed worsening perforated diverticulitis with abscess formation.  General surgery was consulted and eventually underwent Hartman's procedure on 6/30.  Subjective / 24h Interval events: She is feeling a little bit better today.  No nausea or vomiting.  Tolerating clamped NG tube and clear liquids  Assesement and Plan: Principal Problem:   Perforation of sigmoid colon due to diverticulitis Active Problems:   Obesity, Class III, BMI 40-49.9 (morbid obesity) (HCC)   Prediabetes   Hyponatremia   Hypokalemia   Bandemia   Normocytic anemia   Principal problem Sigmoid colon perforation due to diverticulitis, multiple abscesses, postop ileus-appreciate general surgery follow-up.  She is status post sigmoid colectomy and end colostomy, takedown of splenic flexure by Dr. Barry Dienes on 6/30.  Postop course complicated by ileus but now seems to be improving.  Tolerating NG tube clamping, now removed on 7/7.  Advance diet -Given WBC elevation underwent a CT scan of the abdomen and pelvis 7/5 which showed left-sided fluid collection.  She is status post IR guided drainage on 7/6.  Continue antibiotics, cultures without WBC or organisms so far.  Active problems  Hyponatremia-sodium now normalized   Hypokalemia -potassium normal today.  Monitor and replete as indicated   Thrombocytosis -platelets remain normal  Normocytic Anemia -no bleeding, monitor  Pre-Diabetes-continue to monitor CBGs note that she is eating more, overall stable  CBG (last 3)  Recent Labs     06/11/22 1643 06/12/22 0028 06/12/22 0814  GLUCAP 98 112* 122*    Obesity-BMI 40.  She would benefit from weight loss  Scheduled Meds:  enoxaparin (LOVENOX) injection  40 mg Subcutaneous Q24H   feeding supplement  237 mL Oral BID BM   hydrocortisone cream   Topical QID   HYDROmorphone   Intravenous Q4H   sodium chloride flush  5 mL Intracatheter Q8H   Continuous Infusions:  lactated ringers 75 mL/hr at 06/12/22 0517   methocarbamol (ROBAXIN) IV     piperacillin-tazobactam (ZOSYN)  IV 3.375 g (06/12/22 0516)   promethazine (PHENERGAN) injection (IM or IVPB)     PRN Meds:.alum & mag hydroxide-simeth, menthol-cetylpyridinium, methocarbamol (ROBAXIN) IV, ondansetron (ZOFRAN) IV, mouth rinse, phenol, promethazine (PHENERGAN) injection (IM or IVPB)  Diet Orders (From admission, onward)     Start     Ordered   06/12/22 1038  Diet full liquid Room service appropriate? Yes; Fluid consistency: Thin  Diet effective now       Question Answer Comment  Room service appropriate? Yes   Fluid consistency: Thin      06/12/22 1037            DVT prophylaxis: enoxaparin (LOVENOX) injection 40 mg Start: 06/03/22 2200 SCDs Start: 06/01/22 0431   Lab Results  Component Value Date   PLT 387 06/12/2022      Code Status: Full Code  Family Communication: no family at bedside   Status is: Inpatient  Remains inpatient appropriate because: Persistent ileus  Level of care: Progressive  Consultants:  General surgery  Objective: Vitals:   06/12/22 0516 06/12/22 0529 06/12/22 0807 06/12/22 1023  BP:  134/77  103/71  Pulse:  91  92  Resp: (!) 22 20 (!) 21 16  Temp:  98.4 F (36.9 C)  98.6 F (37 C)  TempSrc:  Oral  Oral  SpO2: 93% 96% 97% 94%  Weight:      Height:        Intake/Output Summary (Last 24 hours) at 06/12/2022 1120 Last data filed at 06/12/2022 1000 Gross per 24 hour  Intake 2442.41 ml  Output 1280 ml  Net 1162.41 ml    Wt Readings from Last 3 Encounters:   06/05/22 106.6 kg  05/29/22 106.6 kg  04/30/21 120.2 kg    Examination:  Constitutional: NAD Eyes: lids and conjunctivae normal, no scleral icterus ENMT: mmm Neck: normal, supple Respiratory: clear to auscultation bilaterally, no wheezing, no crackles. Normal respiratory effort.  Cardiovascular: Regular rate and rhythm, no murmurs / rubs / gallops. No LE edema. Abdomen: Deferred, wound VAC in place Skin: no rashes Neurologic: no focal deficits, equal strength  Data Reviewed: I have independently reviewed following labs and imaging studies   CBC Recent Labs  Lab 06/06/22 0426 06/07/22 0436 06/08/22 0355 06/09/22 0409 06/10/22 0559 06/11/22 0432 06/12/22 0436  WBC 26.4*   < > 16.0* 16.5* 20.7* 19.3* 15.1*  HGB 11.2*   < > 9.1* 9.8* 10.1* 9.2* 9.3*  HCT 34.6*   < > 29.0* 31.5* 32.1* 28.2* 29.1*  PLT 376   < > 303 317 359 331 387  MCV 89.4   < > 91.5 92.9 90.4 90.4 90.1  MCH 28.9   < > 28.7 28.9 28.5 29.5 28.8  MCHC 32.4   < > 31.4 31.1 31.5 32.6 32.0  RDW 14.8   < > 15.2 15.3 15.0 15.0 14.8  LYMPHSABS 0.9  --   --  1.6 1.9  --   --   MONOABS 1.3*  --   --  1.2* 1.3*  --   --   EOSABS 0.0  --   --  0.3 0.3  --   --   BASOSABS 0.1  --   --  0.1 0.1  --   --    < > = values in this interval not displayed.     Recent Labs  Lab 06/06/22 0426 06/07/22 0436 06/08/22 0355 06/09/22 0409 06/10/22 0559 06/11/22 0432 06/12/22 0436  NA 140 141 139 140 138 137 139  K 4.2 3.8 3.8 4.0 3.8 3.9 3.8  CL 108 105 102 101 98 101 102  CO2 25 31 32 '31 31 28 28  '$ GLUCOSE 149* 111* 98 86 103* 103* 107*  BUN '15 14 11 9 7 '$ 5* <5*  CREATININE 0.76 0.57 0.64 0.65 0.65 0.55 0.60  CALCIUM 7.8* 7.8* 7.7* 7.8* 8.1* 8.1* 8.2*  AST 10* 16 53* 47* 33 21 15  ALT '9 13 24 '$ 36 35 26 23  ALKPHOS 39 56 94 103 74 72 76  BILITOT 0.8 0.6 0.7 0.7 0.8 1.0 1.1  ALBUMIN 2.5* 2.4* 2.1* 2.1* 2.2* 2.2* 2.1*  MG 2.1 2.5*  --  2.1 2.1  --  2.0      ------------------------------------------------------------------------------------------------------------------ No results for input(s): "CHOL", "HDL", "LDLCALC", "TRIG", "CHOLHDL", "LDLDIRECT" in the last 72 hours.  Lab Results  Component Value Date   HGBA1C 5.6 06/01/2022   ------------------------------------------------------------------------------------------------------------------ No results for input(s): "TSH", "T4TOTAL", "T3FREE", "THYROIDAB" in the last 72 hours.  Invalid input(s): "FREET3"  Cardiac Enzymes No results for input(s): "CKMB", "TROPONINI", "MYOGLOBIN" in the last 168 hours.  Invalid  input(s): "CK" ------------------------------------------------------------------------------------------------------------------ No results found for: "BNP"  CBG: Recent Labs  Lab 06/10/22 2348 06/11/22 0736 06/11/22 1643 06/12/22 0028 06/12/22 0814  GLUCAP 110* 92 98 112* 122*     Recent Results (from the past 240 hour(s))  Aerobic/Anaerobic Culture w Gram Stain (surgical/deep wound)     Status: None (Preliminary result)   Collection Time: 06/11/22  2:14 PM   Specimen: Abscess  Result Value Ref Range Status   Specimen Description   Final    ABSCESS Performed at [facility] [address] [place], [place] [address]    Special Requests   Final    ABDOMEN Performed at [facility], [place] [address] [place], [place] [address]    Gram Stain NO WBC SEEN NO ORGANISMS SEEN   Final   Culture   Final    NO GROWTH < 24 HOURS Performed at [facility], [place] [address], [place], [place] [address]    Report Status PENDING  Incomplete     Radiology Studies: CT IMAGE GUIDED DRAINAGE BY PERCUTANEOUS CATHETER  Result Date: 06/11/2022 INDICATION: 51 year old female with history of left lower quadrant intra-abdominal fluid collection status post diverting colostomy approximately 1 week ago. EXAM: CT IMAGE GUIDED DRAINAGE  BY PERCUTANEOUS CATHETER COMPARISON:  06/10/2022 MEDICATIONS: The patient is currently admitted to the hospital and receiving intravenous antibiotics. The antibiotics were administered within an appropriate time frame prior to the initiation of the procedure. ANESTHESIA/SEDATION: Moderate (conscious) sedation was employed during this procedure. A total of Versed 2 mg and Fentanyl 100 mcg was administered intravenously. Moderate Sedation Time: 11 minutes. The patient's level of consciousness and vital signs were monitored continuously by radiology nursing throughout the procedure under my direct supervision. CONTRAST:  None COMPLICATIONS: None immediate. PROCEDURE: RADIATION DOSE REDUCTION: This exam was performed according to the departmental dose-optimization program which includes automated exposure control, adjustment of the mA and/or kV according to patient size and/or use of iterative reconstruction technique. Informed written consent was obtained from the patient after a discussion of the risks, benefits and alternatives to treatment. The patient was placed supine on the CT gantry and a pre procedural CT was performed re-demonstrating the known abscess/fluid collection within the left lateral abdomen, pericolic region. The procedure was planned. A timeout was performed prior to the initiation of the procedure. The left upper quadrant was prepped and draped in the usual sterile fashion. The overlying soft tissues were anesthetized with 1% lidocaine with epinephrine. Appropriate trajectory was planned with the use of a 22 gauge spinal needle. An 18 gauge trocar needle was advanced into the abscess/fluid collection and a short Amplatz super stiff wire was coiled within the collection. Appropriate positioning was confirmed with a limited CT scan. The tract was serially dilated allowing placement of a 10 [place] all-purpose drainage catheter. Appropriate positioning was confirmed with a limited postprocedural CT  scan. Approximately 75 ml of serosanguineous fluid was aspirated. The tube was connected to a bulb suction and sutured in place. A dressing was placed. The patient tolerated the procedure well without immediate post procedural complication. IMPRESSION: Successful CT guided placement of a 10.2 [place] all purpose drain catheter into the left upper quadrant intra-abdominal fluid collection with aspiration of approximately 75 mL of serosanguineous fluid. Samples were sent to the laboratory as requested by the ordering clinical team. Ruthann Cancer, MD [facility] Radiology Electronically Signed   By: Ruthann Cancer M.D.   On: 06/11/2022 16:00  Marzetta Board, MD, PhD [facility]  Between 7 am - 7 pm I am available, please contact me via Amion (for emergencies) or Securechat (non urgent messages)  Between 7 pm - 7 am I am not available, please contact night coverage MD/APP via Amion

## 2022-06-12 NOTE — Plan of Care (Signed)
  Problem: Clinical Measurements: Goal: Will remain free from infection Outcome: Progressing Goal: Diagnostic test results will improve Outcome: Progressing Goal: Respiratory complications will improve Outcome: Progressing   Problem: Coping: Goal: Level of anxiety will decrease Outcome: Progressing   Problem: Elimination: Goal: Will not experience complications related to bowel motility Outcome: Progressing   Problem: Safety: Goal: Ability to remain free from injury will improve Outcome: Progressing   

## 2022-06-12 NOTE — Consult Note (Signed)
WOC Nurse Consult Note: Patient receiving care in [place] 1436.  Reason for Consult: VAC application to abdominal wounds Wound type: surgical Pressure Injury POA: Yes/No/NA Measurement: upper wound measures 14 cm x 5.7 cm x 6.2 cm. Lower wound measures 12.5 cm x 6 cm x 7.2 cm with a 5.6 cm tunnel at 12 o'clock Wound bed: pink, clean Drainage (amount, consistency, odor) to be determined Periwound: intact. Staples between wounds protected by a piece of Mepitel silicone dressing. One barrier ring applied along inferior border. Dressing procedure/placement/frequency: Two pieces of black foam placed into each wound. I tucked as much foam into the tunnel of the lower wound as possible. Drape applied over all, wounds bridged, immediate seal obtained. Large VAC dressing and mepitel in room for Monday.  Val Riles, RN, MSN, CWOCN, CNS-BC, pager [phone]

## 2022-06-12 NOTE — Progress Notes (Signed)
7 Days Post-Op   Subjective/Chief Complaint: Just got VAC placed. Tolerated CLD around NG. +gas/stool via ostomy.   Objective: Vital signs in last 24 hours: Temp:  [98.2 F (36.8 C)-98.6 F (37 C)] 98.6 F (37 C) (07/07 1023) Pulse Rate:  [91-104] 92 (07/07 1023) Resp:  [13-28] 16 (07/07 1023) BP: (99-134)/(66-87) 103/71 (07/07 1023) SpO2:  [92 %-99 %] 94 % (07/07 1023) FiO2 (%):  [41 %-44 %] 44 % (07/07 0002) Last BM Date : 06/12/22 (through ostomy)  Intake/Output from previous day: 07/06 0701 - 07/07 0700 In: 2682.4 [P.O.:360; I.V.:2143.8; IV Piggyback:173.6] Out: 630 [Urine:400; Emesis/NG output:100; Drains:105; Stool:25] Intake/Output this shift: Total I/O In: -  Out: 650 [Urine:650]  General appearance: alert and cooperative Resp: clear to auscultation bilaterally Cardio: regular rate and rhythm GI: soft, moderate tenderness. Ostomy viable with some air and stool in bag NG removed by me Lab Results:  Recent Labs    06/11/22 0432 06/12/22 0436  WBC 19.3* 15.1*  HGB 9.2* 9.3*  HCT 28.2* 29.1*  PLT 331 387   BMET Recent Labs    06/11/22 0432 06/12/22 0436  NA 137 139  K 3.9 3.8  CL 101 102  CO2 28 28  GLUCOSE 103* 107*  BUN 5* <5*  CREATININE 0.55 0.60  CALCIUM 8.1* 8.2*   PT/INR No results for input(s): "LABPROT", "INR" in the last 72 hours. ABG No results for input(s): "PHART", "HCO3" in the last 72 hours.  Invalid input(s): "PCO2", "PO2"  Studies/Results: CT IMAGE GUIDED DRAINAGE BY PERCUTANEOUS CATHETER  Result Date: 06/11/2022 INDICATION: 51 year old female with history of left lower quadrant intra-abdominal fluid collection status post diverting colostomy approximately 1 week ago. EXAM: CT IMAGE GUIDED DRAINAGE BY PERCUTANEOUS CATHETER COMPARISON:  06/10/2022 MEDICATIONS: The patient is currently admitted to the hospital and receiving intravenous antibiotics. The antibiotics were administered within an appropriate time frame prior to the  initiation of the procedure. ANESTHESIA/SEDATION: Moderate (conscious) sedation was employed during this procedure. A total of Versed 2 mg and Fentanyl 100 mcg was administered intravenously. Moderate Sedation Time: 11 minutes. The patient's level of consciousness and vital signs were monitored continuously by radiology nursing throughout the procedure under my direct supervision. CONTRAST:  None COMPLICATIONS: None immediate. PROCEDURE: RADIATION DOSE REDUCTION: This exam was performed according to the departmental dose-optimization program which includes automated exposure control, adjustment of the mA and/or kV according to patient size and/or use of iterative reconstruction technique. Informed written consent was obtained from the patient after a discussion of the risks, benefits and alternatives to treatment. The patient was placed supine on the CT gantry and a pre procedural CT was performed re-demonstrating the known abscess/fluid collection within the left lateral abdomen, pericolic region. The procedure was planned. A timeout was performed prior to the initiation of the procedure. The left upper quadrant was prepped and draped in the usual sterile fashion. The overlying soft tissues were anesthetized with 1% lidocaine with epinephrine. Appropriate trajectory was planned with the use of a 22 gauge spinal needle. An 18 gauge trocar needle was advanced into the abscess/fluid collection and a short Amplatz super stiff wire was coiled within the collection. Appropriate positioning was confirmed with a limited CT scan. The tract was serially dilated allowing placement of a 10 [place] all-purpose drainage catheter. Appropriate positioning was confirmed with a limited postprocedural CT scan. Approximately 75 ml of serosanguineous fluid was aspirated. The tube was connected to a bulb suction and sutured in place. A dressing was placed. The patient  tolerated the procedure well without immediate post procedural  complication. IMPRESSION: Successful CT guided placement of a 10.2 [place] all purpose drain catheter into the left upper quadrant intra-abdominal fluid collection with aspiration of approximately 75 mL of serosanguineous fluid. Samples were sent to the laboratory as requested by the ordering clinical team. Ruthann Cancer, MD [facility] Radiology Electronically Signed   By: Ruthann Cancer M.D.   On: 06/11/2022 16:00   CT ABDOMEN PELVIS W CONTRAST  Result Date: 06/10/2022 CLINICAL DATA:  Abdominal pain, postop day 6, suspect abscess, history of diverticulitis EXAM: CT ABDOMEN AND PELVIS WITH CONTRAST TECHNIQUE: Multidetector CT imaging of the abdomen and pelvis was performed using the standard protocol following bolus administration of intravenous contrast. RADIATION DOSE REDUCTION: This exam was performed according to the departmental dose-optimization program which includes automated exposure control, adjustment of the mA and/or kV according to patient size and/or use of iterative reconstruction technique. CONTRAST:  183m OMNIPAQUE IOHEXOL 300 MG/ML  SOLN COMPARISON:  06/04/2022 FINDINGS: Lower chest: Hypoventilatory changes are seen at the lung bases. Small bilateral effusions, left greater than right. Hepatobiliary: No focal liver abnormality is seen. No gallstones, gallbladder wall thickening, or biliary dilatation. Pancreas: Unremarkable. No pancreatic ductal dilatation or surrounding inflammatory changes. Spleen: Normal in size without focal abnormality. Adrenals/Urinary Tract: The kidneys enhance normally and symmetrically. No urinary tract calculi or obstructive uropathy. The adrenals are unremarkable. Bladder is grossly normal. Stomach/Bowel: Postsurgical changes are seen from distal colectomy with diverting colostomy left lower quadrant. No bowel obstruction or ileus. Normal appendix right lower quadrant. Enteric catheter identified, tip within the proximal  duodenum. Vascular/Lymphatic: No significant vascular findings are present. No enlarged abdominal or pelvic lymph nodes. Reproductive: Stable uterine fibroid and IUD.  No adnexal masses. Other: There is free fluid throughout the abdomen and pelvis. Fluid within the left lower quadrant and upper pelvis is partially loculated, without rim enhancement to suggest organized abscess at this time. Small amount of free intraperitoneal gas consistent with recent surgical intervention. Postsurgical changes related to midline laparotomy, with surgical packing in place. Musculoskeletal: No acute or destructive bony lesions. Reconstructed images demonstrate no additional findings. IMPRESSION: 1. Free fluid throughout the abdomen and pelvis. Fluid in the left lower quadrant and upper pelvis is partially loculated, without rim enhancement to suggest organized abscess at this time. Continued follow-up is recommended. 2. Postsurgical changes from midline laparotomy, distal colectomy, and diverting colostomy within the left lower quadrant. 3. Small bilateral pleural effusions, left greater than right, with bibasilar hypoventilatory changes. 4. Stable uterine fibroid. Electronically Signed   By: MRanda NgoM.D.   On: 06/10/2022 19:45    Anti-infectives: Anti-infectives (From admission, onward)    Start     Dose/Rate Route Frequency Ordered Stop   06/03/22 1400  piperacillin-tazobactam (ZOSYN) IVPB 3.375 g        3.375 g 12.5 mL/hr over 240 Minutes Intravenous Every 8 hours 06/03/22 0900     06/02/22 0000  cefTRIAXone (ROCEPHIN) 2 g in sodium chloride 0.9 % 100 mL IVPB  Status:  Discontinued        2 g 200 mL/hr over 30 Minutes Intravenous Every 24 hours 06/01/22 1302 06/03/22 0856   06/01/22 1400  metroNIDAZOLE (FLAGYL) IVPB 500 mg  Status:  Discontinued        500 mg 100 mL/hr over 60 Minutes Intravenous Every 12 hours 06/01/22 1302 06/03/22 0856   06/01/22 1200  cefTRIAXone (ROCEPHIN) 2 g in sodium chloride 0.9 %  100 mL IVPB  Status:  Discontinued        2 g 200 mL/hr over 30 Minutes Intravenous Every 24 hours 06/01/22 0432 06/01/22 0801   06/01/22 0900  Ampicillin-Sulbactam (UNASYN) 3 g in sodium chloride 0.9 % 100 mL IVPB  Status:  Discontinued        3 g 200 mL/hr over 30 Minutes Intravenous Every 6 hours 06/01/22 0801 06/01/22 1302   06/01/22 0030  cefTRIAXone (ROCEPHIN) 1 g in sodium chloride 0.9 % 100 mL IVPB        1 g 200 mL/hr over 30 Minutes Intravenous  Once 06/01/22 0027 06/01/22 0155   06/01/22 0030  metroNIDAZOLE (FLAGYL) IVPB 500 mg  Status:  Discontinued        500 mg 100 mL/hr over 60 Minutes Intravenous Every 12 hours 06/01/22 0027 06/01/22 0801       Assessment/Plan: s/p Procedure(s): TOTAL COLECTOMY (N/A) S/p IR drainage L paracolic gutter collection 7/6, SS, Cx NGTD WBC 15 from 19, afebrile Advance diet. FLD Continue IV zosyn Continue VAC M/W/F PO pain control, continue PCA for today but likely D/C tomorrow 7/8 Ambulate    LOS: 11 days    Jill Alexanders 06/12/2022

## 2022-06-12 NOTE — Progress Notes (Signed)
Physical Therapy Treatment Patient Details Name: Morgan Cline MRN: [id number] DOB: May 18, 1971 Today's Date: 06/12/2022   History of Present Illness 51 year old female with PMH of morbid obesity, prediabetes and migraine headache returning with worsening abdominal pain due to acute sigmoid diverticulitis despite oral antibiotics.  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    PT Comments    Pt very pleasant and willing.  Assisted OOB to [facility] then to amb in hallway required increased time and effort.  General Gait Details: slow but functional gait increased c/o ABD pain with activity.  Used walker just for safety/recent ABD surgery.  slow.  Tolerated an increased distance of 85 feet. Assisted to seated position EOB to eat lunch. Pt plans to return home.    Recommendations for follow up therapy are one component of a multi-disciplinary discharge planning process, led by the attending physician.  Recommendations may be updated based on patient status, additional functional criteria and insurance authorization.  Follow Up Recommendations  No PT follow up     Assistance Recommended at Discharge    Patient can return home with the following A little help with walking and/or transfers;A little help with bathing/dressing/bathroom;Help with stairs or ramp for entrance   Equipment Recommendations  None recommended by PT    Recommendations for Other Services       Precautions / Restrictions Precautions Precautions: Fall Precaution Comments: multiple lines, colostomy,IV, wound VAC Restrictions Weight Bearing Restrictions: No     Mobility  Bed Mobility Overal bed mobility: Needs Assistance Bed Mobility: Supine to Sit     Supine to sit: Min assist, Mod assist     General bed mobility comments: increased time and assist with upper body due to recent ABD surgery.    Transfers Overall transfer level: Needs  assistance Equipment used: Rolling walker (2 wheels) Transfers: Sit to/from Stand Sit to Stand: Supervision, Min guard           General transfer comment: physically self able, required assist with multiple lines/leads/equipment.  First asissted to [facility] then from [facility] to standing at walker.    Ambulation/Gait Ambulation/Gait assistance: Supervision Gait Distance (Feet): 85 Feet Assistive device: Rolling walker (2 wheels) Gait Pattern/deviations: Step-through pattern, Decreased stride length, Trunk flexed Gait velocity: decreased     General Gait Details: slow but functional gait increased c/o ABD pain with activity.  Used walker just for safety/recent ABD surgery.  slow.  Tolerated an increased distance of 85 feet.   Stairs             Wheelchair Mobility    Modified Rankin (Stroke Patients Only)       Balance                                            Cognition Arousal/Alertness: Awake/alert Behavior During Therapy: WFL for tasks assessed/performed Overall Cognitive Status: Within Functional Limits for tasks assessed                                 General Comments: AxO x 3 very pleasant and willing.        Exercises      General Comments        Pertinent Vitals/Pain Pain Assessment Pain Assessment: Faces Faces Pain Scale: Hurts little more Pain  Location: abdomen Pain Descriptors / Indicators: Aching, Grimacing, Tender, Operative site guarding Pain Intervention(s): Monitored during session, Premedicated before session, Repositioned    Home Living                          Prior Function            PT Goals (current goals can now be found in the care plan section) Progress towards PT goals: Progressing toward goals    Frequency    Min 3X/week      PT Plan Current plan remains appropriate    Co-evaluation              AM-PAC PT "6 Clicks" Mobility   Outcome Measure  Help needed  turning from your back to your side while in a flat bed without using bedrails?: A Little Help needed moving from lying on your back to sitting on the side of a flat bed without using bedrails?: A Little Help needed moving to and from a bed to a chair (including a wheelchair)?: A Little Help needed standing up from a chair using your arms (e.g., wheelchair or bedside chair)?: A Little Help needed to walk in hospital room?: A Little Help needed climbing 3-5 steps with a railing? : A Lot 6 Click Score: 17    End of Session Equipment Utilized During Treatment: Gait belt Activity Tolerance: Patient tolerated treatment well Patient left: in bed;with call bell/phone within reach Nurse Communication: Mobility status PT Visit Diagnosis: Other abnormalities of gait and mobility (R26.89)     Time: 1340-1405 PT Time Calculation (min) (ACUTE ONLY): 25 min  Charges:  $Gait Training: 8-22 mins $Therapeutic Activity: 8-22 mins                     Rica Koyanagi  PTA [place] [facility] M-F          [phone] Weekend pager [phone]

## 2022-06-13 DIAGNOSIS — K5720 Diverticulitis of large intestine with perforation and abscess without bleeding: Secondary | ICD-10-CM | POA: Diagnosis not present

## 2022-06-13 LAB — CBC
HCT: 28.8 % — ABNORMAL LOW (ref 36.0–46.0)
Hemoglobin: 9.2 g/dL — ABNORMAL LOW (ref 12.0–15.0)
MCH: 29.1 pg (ref 26.0–34.0)
MCHC: 31.9 g/dL (ref 30.0–36.0)
MCV: 91.1 fL (ref 80.0–100.0)
Platelets: 443 10*3/uL — ABNORMAL HIGH (ref 150–400)
RBC: 3.16 MIL/uL — ABNORMAL LOW (ref 3.87–5.11)
RDW: 15.1 % (ref 11.5–15.5)
WBC: 13.4 10*3/uL — ABNORMAL HIGH (ref 4.0–10.5)
nRBC: 0 % (ref 0.0–0.2)

## 2022-06-13 LAB — BASIC METABOLIC PANEL
Anion gap: 7 (ref 5–15)
BUN: <5 mg/dL — ABNORMAL LOW (ref 6–20)
CO2: 28 mmol/L (ref 22–32)
Calcium: 8.2 mg/dL — ABNORMAL LOW (ref 8.9–10.3)
Chloride: 104 mmol/L (ref 98–111)
Creatinine, Ser: 0.52 mg/dL (ref 0.44–1.00)
GFR, Estimated: >60 mL/min (ref >60)
Glucose, Bld: 105 mg/dL — ABNORMAL HIGH (ref 70–99)
Potassium: 3.7 mmol/L (ref 3.5–5.1)
Sodium: 139 mmol/L (ref 135–145)

## 2022-06-13 LAB — GLUCOSE, CAPILLARY
Glucose-Capillary: 107 mg/dL — ABNORMAL HIGH (ref 70–99)
Glucose-Capillary: 129 mg/dL — ABNORMAL HIGH (ref 70–99)
Glucose-Capillary: 134 mg/dL — ABNORMAL HIGH (ref 70–99)

## 2022-06-13 MED ORDER — OXYCODONE HCL 5 MG PO TABS
5.0000 mg | ORAL_TABLET | Freq: Four times a day (QID) | ORAL | Status: DC | PRN
  Administered 2022-06-13 – 2022-06-18 (×14): 10 mg via ORAL
  Filled 2022-06-13 (×16): qty 2

## 2022-06-13 MED ORDER — HYDROMORPHONE HCL 1 MG/ML IJ SOLN
0.5000 mg | INTRAMUSCULAR | Status: DC | PRN
  Administered 2022-06-13 – 2022-06-15 (×4): 0.5 mg via INTRAVENOUS
  Filled 2022-06-13 (×4): qty 0.5

## 2022-06-13 NOTE — Progress Notes (Signed)
PROGRESS NOTE  Morgan Cline FTD:[id number] DOB: 06-Aug-1971 DOA: 05/31/2022 PCP: [facility]   LOS: 12 days   Brief Narrative / Interim history: 51 year old female with history of prediabetes, obesity, comes to the hospital with abdominal pain.  She was recently seen in the ED couple days prior to admission with sigmoid diverticulitis, she was given Augmentin and sent home, however returned due to worsening pain.  A CT scan showed worsening perforated diverticulitis with abscess formation.  General surgery was consulted and eventually underwent Hartman's procedure on 6/30.  Subjective / 24h Interval events: Feeling better.  NG tube is out.  Some abdominal soreness but manageable  Assesement and Plan: Principal Problem:   Perforation of sigmoid colon due to diverticulitis Active Problems:   Obesity, Class III, BMI 40-49.9 (morbid obesity) (HCC)   Prediabetes   Hyponatremia   Hypokalemia   Bandemia   Normocytic anemia   Principal problem Sigmoid colon perforation due to diverticulitis, multiple abscesses, postop ileus-appreciate general surgery follow-up.  She is status post sigmoid colectomy and end colostomy, takedown of splenic flexure by Dr. Barry Dienes on 6/30.  Postop course complicated by ileus but now seems to be improving.  Tolerating NG tube clamping, now removed on 7/7.  Advance diet -Given WBC elevation underwent a CT scan of the abdomen and pelvis 7/5 which showed left-sided fluid collection.  She is status post IR guided drainage on 7/6.  Continue antibiotics, cultures without growth.  WBC improving  Active problems  Hyponatremia-sodium remains normal   Hypokalemia -potassium normal today   Thrombocytosis -stable  Normocytic Anemia -no bleeding, monitor  Pre-Diabetes-continue to monitor CBGs note that she is eating more, overall stable  CBG (last 3)  Recent Labs    06/12/22 1641 06/13/22 0022 06/13/22 0736  GLUCAP 104* 129* 107*    Obesity-BMI 40.   She would benefit from weight loss  Scheduled Meds:  enoxaparin (LOVENOX) injection  40 mg Subcutaneous Q24H   feeding supplement  237 mL Oral BID BM   hydrocortisone cream   Topical QID   sodium chloride flush  5 mL Intracatheter Q8H   Continuous Infusions:  methocarbamol (ROBAXIN) IV     piperacillin-tazobactam (ZOSYN)  IV 3.375 g (06/13/22 0501)   promethazine (PHENERGAN) injection (IM or IVPB)     PRN Meds:.alum & mag hydroxide-simeth, HYDROmorphone (DILAUDID) injection, menthol-cetylpyridinium, methocarbamol (ROBAXIN) IV, ondansetron (ZOFRAN) IV, mouth rinse, oxyCODONE, phenol, promethazine (PHENERGAN) injection (IM or IVPB)  Diet Orders (From admission, onward)     Start     Ordered   06/13/22 0830  DIET SOFT Room service appropriate? Yes; Fluid consistency: Thin  Diet effective now       Question Answer Comment  Room service appropriate? Yes   Fluid consistency: Thin      06/13/22 0830            DVT prophylaxis: enoxaparin (LOVENOX) injection 40 mg Start: 06/03/22 2200 SCDs Start: 06/01/22 0431   Lab Results  Component Value Date   PLT 443 (H) 06/13/2022      Code Status: Full Code  Family Communication: no family at bedside   Status is: Inpatient  Remains inpatient appropriate because: Persistent ileus  Level of care: Progressive  Consultants:  General surgery  Objective: Vitals:   06/13/22 0041 06/13/22 0452 06/13/22 0503 06/13/22 0941  BP: 98/68  108/71 99/62  Pulse: 87  88 90  Resp: '16 18 17 20  '$ Temp: 98.3 F (36.8 C)  98.4 F (36.9 C) 98.3 F (  36.8 C)  TempSrc: Oral  Oral Oral  SpO2: 96% 95% 97% 96%  Weight:      Height:        Intake/Output Summary (Last 24 hours) at 06/13/2022 1314 Last data filed at 06/13/2022 1000 Gross per 24 hour  Intake 2733.73 ml  Output 2130 ml  Net 603.73 ml    Wt Readings from Last 3 Encounters:  06/05/22 106.6 kg  05/29/22 106.6 kg  04/30/21 120.2 kg    Examination:  Constitutional: NAD Eyes:  lids and conjunctivae normal, no scleral icterus ENMT: mmm Neck: normal, supple Respiratory: clear to auscultation bilaterally, no wheezing, no crackles. Normal respiratory effort.  Cardiovascular: Regular rate and rhythm, no murmurs / rubs / gallops. No LE edema. Skin: no rashes Neurologic: no focal deficits, equal strength  Data Reviewed: I have independently reviewed following labs and imaging studies   CBC Recent Labs  Lab 06/09/22 0409 06/10/22 0559 06/11/22 0432 06/12/22 0436 06/13/22 0450  WBC 16.5* 20.7* 19.3* 15.1* 13.4*  HGB 9.8* 10.1* 9.2* 9.3* 9.2*  HCT 31.5* 32.1* 28.2* 29.1* 28.8*  PLT 317 359 331 387 443*  MCV 92.9 90.4 90.4 90.1 91.1  MCH 28.9 28.5 29.5 28.8 29.1  MCHC 31.1 31.5 32.6 32.0 31.9  RDW 15.3 15.0 15.0 14.8 15.1  LYMPHSABS 1.6 1.9  --   --   --   MONOABS 1.2* 1.3*  --   --   --   EOSABS 0.3 0.3  --   --   --   BASOSABS 0.1 0.1  --   --   --      Recent Labs  Lab 06/07/22 0436 06/08/22 0355 06/09/22 0409 06/10/22 0559 06/11/22 0432 06/12/22 0436 06/13/22 0450  NA 141 139 140 138 137 139 139  K 3.8 3.8 4.0 3.8 3.9 3.8 3.7  CL 105 102 101 98 101 102 104  CO2 31 32 '31 31 28 28 28  '$ GLUCOSE 111* 98 86 103* 103* 107* 105*  BUN '14 11 9 7 '$ 5* <5* <5*  CREATININE 0.57 0.64 0.65 0.65 0.55 0.60 0.52  CALCIUM 7.8* 7.7* 7.8* 8.1* 8.1* 8.2* 8.2*  AST 16 53* 47* 33 21 15  --   ALT 13 24 36 35 26 23  --   ALKPHOS 56 94 103 74 72 76  --   BILITOT 0.6 0.7 0.7 0.8 1.0 1.1  --   ALBUMIN 2.4* 2.1* 2.1* 2.2* 2.2* 2.1*  --   MG 2.5*  --  2.1 2.1  --  2.0  --      ------------------------------------------------------------------------------------------------------------------ No results for input(s): "CHOL", "HDL", "LDLCALC", "TRIG", "CHOLHDL", "LDLDIRECT" in the last 72 hours.  Lab Results  Component Value Date   HGBA1C 5.6 06/01/2022    ------------------------------------------------------------------------------------------------------------------ No results for input(s): "TSH", "T4TOTAL", "T3FREE", "THYROIDAB" in the last 72 hours.  Invalid input(s): "FREET3"  Cardiac Enzymes No results for input(s): "CKMB", "TROPONINI", "MYOGLOBIN" in the last 168 hours.  Invalid input(s): "CK" ------------------------------------------------------------------------------------------------------------------ No results found for: "BNP"  CBG: Recent Labs  Lab 06/12/22 0028 06/12/22 0814 06/12/22 1641 06/13/22 0022 06/13/22 0736  GLUCAP 112* 122* 104* 129* 107*     Recent Results (from the past 240 hour(s))  Aerobic/Anaerobic Culture w Gram Stain (surgical/deep wound)     Status: None (Preliminary result)   Collection Time: 06/11/22  2:14 PM   Specimen: Abscess  Result Value Ref Range Status   Specimen Description   Final    ABSCESS Performed at [facility], [place] [address] [place], [place] [address]    Special Requests   Final    ABDOMEN Performed at [facility], [place] [address], [place], [place] [address]    Gram Stain NO WBC SEEN NO ORGANISMS SEEN   Final   Culture   Final    NO GROWTH 2 DAYS Performed at [facility], [place] [address] [place], [place] [address]    Report Status PENDING  Incomplete     Radiology Studies: No results found.   Marzetta Board, MD, PhD [facility]  Between 7 am - 7 pm I am available, please contact me via Amion (for emergencies) or Securechat (non urgent messages)  Between 7 pm - 7 am I am not available, please contact night coverage MD/APP via Amion

## 2022-06-13 NOTE — Progress Notes (Addendum)
8 Days Post-Op   Subjective/Chief Complaint: Feeling better, tolerating a diet and having stool from ostomy   Objective: Vital signs in last 24 hours: Temp:  [98.3 F (36.8 C)-98.6 F (37 C)] 98.4 F (36.9 C) (07/08 0503) Pulse Rate:  [87-106] 88 (07/08 0503) Resp:  [16-20] 17 (07/08 0503) BP: (98-108)/(63-71) 108/71 (07/08 0503) SpO2:  [94 %-97 %] 97 % (07/08 0503) Last BM Date : 06/12/22 (through ostomy)  Intake/Output from previous day: 07/07 0701 - 07/08 0700 In: 2466.4 [P.O.:480; I.V.:1705.3; IV Piggyback:266] Out: 2315 [Urine:2000; Drains:90; Stool:225] Intake/Output this shift: No intake/output data recorded.  General appearance: alert and cooperative Resp: clear to auscultation bilaterally Cardio: regular rate and rhythm GI: soft, moderate tenderness on L side. Ostomy viable with some air and stool in bag JP: SS fluid only  Lab Results:  Recent Labs    06/12/22 0436 06/13/22 0450  WBC 15.1* 13.4*  HGB 9.3* 9.2*  HCT 29.1* 28.8*  PLT 387 443*    BMET Recent Labs    06/12/22 0436 06/13/22 0450  NA 139 139  K 3.8 3.7  CL 102 104  CO2 28 28  GLUCOSE 107* 105*  BUN <5* <5*  CREATININE 0.60 0.52  CALCIUM 8.2* 8.2*    PT/INR No results for input(s): "LABPROT", "INR" in the last 72 hours. ABG No results for input(s): "PHART", "HCO3" in the last 72 hours.  Invalid input(s): "PCO2", "PO2"  Studies/Results: CT IMAGE GUIDED DRAINAGE BY PERCUTANEOUS CATHETER  Result Date: 06/11/2022 INDICATION: 51 year old female with history of left lower quadrant intra-abdominal fluid collection status post diverting colostomy approximately 1 week ago. EXAM: CT IMAGE GUIDED DRAINAGE BY PERCUTANEOUS CATHETER COMPARISON:  06/10/2022 MEDICATIONS: The patient is currently admitted to the hospital and receiving intravenous antibiotics. The antibiotics were administered within an appropriate time frame prior to the initiation of the procedure. ANESTHESIA/SEDATION: Moderate  (conscious) sedation was employed during this procedure. A total of Versed 2 mg and Fentanyl 100 mcg was administered intravenously. Moderate Sedation Time: 11 minutes. The patient's level of consciousness and vital signs were monitored continuously by radiology nursing throughout the procedure under my direct supervision. CONTRAST:  None COMPLICATIONS: None immediate. PROCEDURE: RADIATION DOSE REDUCTION: This exam was performed according to the departmental dose-optimization program which includes automated exposure control, adjustment of the mA and/or kV according to patient size and/or use of iterative reconstruction technique. Informed written consent was obtained from the patient after a discussion of the risks, benefits and alternatives to treatment. The patient was placed supine on the CT gantry and a pre procedural CT was performed re-demonstrating the known abscess/fluid collection within the left lateral abdomen, pericolic region. The procedure was planned. A timeout was performed prior to the initiation of the procedure. The left upper quadrant was prepped and draped in the usual sterile fashion. The overlying soft tissues were anesthetized with 1% lidocaine with epinephrine. Appropriate trajectory was planned with the use of a 22 gauge spinal needle. An 18 gauge trocar needle was advanced into the abscess/fluid collection and a short Amplatz super stiff wire was coiled within the collection. Appropriate positioning was confirmed with a limited CT scan. The tract was serially dilated allowing placement of a 10 [place] all-purpose drainage catheter. Appropriate positioning was confirmed with a limited postprocedural CT scan. Approximately 75 ml of serosanguineous fluid was aspirated. The tube was connected to a bulb suction and sutured in place. A dressing was placed. The patient tolerated the procedure well without immediate post procedural complication. IMPRESSION: Successful  CT guided placement of a 10.2  [place] all purpose drain catheter into the left upper quadrant intra-abdominal fluid collection with aspiration of approximately 75 mL of serosanguineous fluid. Samples were sent to the laboratory as requested by the ordering clinical team. Ruthann Cancer, MD [facility] Radiology Electronically Signed   By: Ruthann Cancer M.D.   On: 06/11/2022 16:00    Anti-infectives: Anti-infectives (From admission, onward)    Start     Dose/Rate Route Frequency Ordered Stop   06/03/22 1400  piperacillin-tazobactam (ZOSYN) IVPB 3.375 g        3.375 g 12.5 mL/hr over 240 Minutes Intravenous Every 8 hours 06/03/22 0900     06/02/22 0000  cefTRIAXone (ROCEPHIN) 2 g in sodium chloride 0.9 % 100 mL IVPB  Status:  Discontinued        2 g 200 mL/hr over 30 Minutes Intravenous Every 24 hours 06/01/22 1302 06/03/22 0856   06/01/22 1400  metroNIDAZOLE (FLAGYL) IVPB 500 mg  Status:  Discontinued        500 mg 100 mL/hr over 60 Minutes Intravenous Every 12 hours 06/01/22 1302 06/03/22 0856   06/01/22 1200  cefTRIAXone (ROCEPHIN) 2 g in sodium chloride 0.9 % 100 mL IVPB  Status:  Discontinued        2 g 200 mL/hr over 30 Minutes Intravenous Every 24 hours 06/01/22 0432 06/01/22 0801   06/01/22 0900  Ampicillin-Sulbactam (UNASYN) 3 g in sodium chloride 0.9 % 100 mL IVPB  Status:  Discontinued        3 g 200 mL/hr over 30 Minutes Intravenous Every 6 hours 06/01/22 0801 06/01/22 1302   06/01/22 0030  cefTRIAXone (ROCEPHIN) 1 g in sodium chloride 0.9 % 100 mL IVPB        1 g 200 mL/hr over 30 Minutes Intravenous  Once 06/01/22 0027 06/01/22 0155   06/01/22 0030  metroNIDAZOLE (FLAGYL) IVPB 500 mg  Status:  Discontinued        500 mg 100 mL/hr over 60 Minutes Intravenous Every 12 hours 06/01/22 0027 06/01/22 0801       Assessment/Plan: s/p Procedure(s): TOTAL COLECTOMY (N/A) Advance diet to soft foods Continue zosyn until 7/11 and then stop Cont vac for subcutaneous  wound Ambulate Intraabdominal fluid on CT. IR drain placed   LOS: 12 days    Rosario Adie 01/13/7823

## 2022-06-13 NOTE — Progress Notes (Signed)
Dilaudid PCA was dc, remaining med (59m) was wasted and documented in the pyxis witnessed by the 2nd RN.

## 2022-06-14 DIAGNOSIS — K5720 Diverticulitis of large intestine with perforation and abscess without bleeding: Secondary | ICD-10-CM | POA: Diagnosis not present

## 2022-06-14 LAB — CBC
HCT: 28.6 % — ABNORMAL LOW (ref 36.0–46.0)
Hemoglobin: 8.9 g/dL — ABNORMAL LOW (ref 12.0–15.0)
MCH: 28.3 pg (ref 26.0–34.0)
MCHC: 31.1 g/dL (ref 30.0–36.0)
MCV: 91.1 fL (ref 80.0–100.0)
Platelets: 473 10*3/uL — ABNORMAL HIGH (ref 150–400)
RBC: 3.14 MIL/uL — ABNORMAL LOW (ref 3.87–5.11)
RDW: 15.1 % (ref 11.5–15.5)
WBC: 12.4 10*3/uL — ABNORMAL HIGH (ref 4.0–10.5)
nRBC: 0 % (ref 0.0–0.2)

## 2022-06-14 LAB — BASIC METABOLIC PANEL
Anion gap: 8 (ref 5–15)
BUN: 5 mg/dL — ABNORMAL LOW (ref 6–20)
CO2: 29 mmol/L (ref 22–32)
Calcium: 8.1 mg/dL — ABNORMAL LOW (ref 8.9–10.3)
Chloride: 104 mmol/L (ref 98–111)
Creatinine, Ser: 0.76 mg/dL (ref 0.44–1.00)
GFR, Estimated: >60 mL/min (ref >60)
Glucose, Bld: 111 mg/dL — ABNORMAL HIGH (ref 70–99)
Potassium: 3.5 mmol/L (ref 3.5–5.1)
Sodium: 141 mmol/L (ref 135–145)

## 2022-06-14 LAB — GLUCOSE, CAPILLARY
Glucose-Capillary: 115 mg/dL — ABNORMAL HIGH (ref 70–99)
Glucose-Capillary: 117 mg/dL — ABNORMAL HIGH (ref 70–99)
Glucose-Capillary: 120 mg/dL — ABNORMAL HIGH (ref 70–99)

## 2022-06-14 MED ORDER — POTASSIUM CHLORIDE CRYS ER 20 MEQ PO TBCR
40.0000 meq | EXTENDED_RELEASE_TABLET | Freq: Once | ORAL | Status: AC
  Administered 2022-06-14: 40 meq via ORAL
  Filled 2022-06-14: qty 2

## 2022-06-14 NOTE — Progress Notes (Signed)
PROGRESS NOTE  Morgan Cline SWN:[id number] DOB: 09-Jan-1971 DOA: 05/31/2022 [facility]CP: [facility]   LOS: 13 days   Brief Narrative / Interim history: 51 year old female with history of prediabetes, obesity, comes to the hospital with abdominal pain.  She was recently seen in the ED couple days prior to admission with sigmoid diverticulitis, she was given Augmentin and sent home, however returned due to worsening pain.  A CT scan showed worsening perforated diverticulitis with abscess formation.  General surgery was consulted and eventually underwent Hartman's procedure on 6/30.  Subjective / 24h Interval events: Continues to feel improved.  Is able to eat better, ambulating some  Assesement and Plan: Principal Problem:   Perforation of sigmoid colon due to diverticulitis Active Problems:   Obesity, Class III, BMI 40-49.9 (morbid obesity) (HCC)   Prediabetes   Hyponatremia   Hypokalemia   Bandemia   Normocytic anemia   Principal problem Sigmoid colon perforation due to diverticulitis, multiple abscesses, postop ileus-appreciate general surgery follow-up.  She is status post sigmoid colectomy and end colostomy, takedown of splenic flexure by Dr. Barry Dienes on 6/30.  Postop course complicated by ileus but now seems to be improving.  Tolerating NG tube clamping, now removed on 7/7.  Advance diet -Given WBC elevation underwent a CT scan of the abdomen and pelvis 7/5 which showed left-sided fluid collection.  She is status post IR guided drainage on 7/6.  WBC continues to improve, cultures without growth.  On Zosyn up until 7/11  Active problems  Hyponatremia-sodium has now normalized   Hypokalemia -potassium is now normalized but on the lower side today, replete   Thrombocytosis -stable  Normocytic Anemia -no bleeding, monitor  Pre-Diabetes-continue to monitor CBGs note that she is eating more, overall stable  CBG (last 3)  Recent Labs    06/13/22 1630 06/14/22 0027  06/14/22 0736  GLUCAP 134* 117* 115*    Obesity-BMI 40.  She would benefit from weight loss  Scheduled Meds:  enoxaparin (LOVENOX) injection  40 mg Subcutaneous Q24H   feeding supplement  237 mL Oral BID BM   hydrocortisone cream   Topical QID   sodium chloride flush  5 mL Intracatheter Q8H   Continuous Infusions:  methocarbamol (ROBAXIN) IV     piperacillin-tazobactam (ZOSYN)  IV 3.375 g (06/14/22 0537)   promethazine (PHENERGAN) injection (IM or IVPB)     PRN Meds:.alum & mag hydroxide-simeth, HYDROmorphone (DILAUDID) injection, menthol-cetylpyridinium, methocarbamol (ROBAXIN) IV, ondansetron (ZOFRAN) IV, mouth rinse, oxyCODONE, phenol, promethazine (PHENERGAN) injection (IM or IVPB)  Diet Orders (From admission, onward)     Start     Ordered   06/13/22 0830  DIET SOFT Room service appropriate? Yes; Fluid consistency: Thin  Diet effective now       Question Answer Comment  Room service appropriate? Yes   Fluid consistency: Thin      06/13/22 0830            DVT prophylaxis: enoxaparin (LOVENOX) injection 40 mg Start: 06/03/22 2200 SCDs Start: 06/01/22 0431   Lab Results  Component Value Date   PLT 473 (H) 06/14/2022      Code Status: Full Code  Family Communication: no family at bedside   Status is: Inpatient  Remains inpatient appropriate because: Persistent ileus  Level of care: Progressive  Consultants:  General surgery  Objective: Vitals:   06/13/22 0503 06/13/22 0941 06/13/22 2015 06/14/22 0541  BP: 1'08/71 99/62 95/61 '$ 103/77  Pulse: 88 90 95 95  Resp: '17 20 16 '$ 18  Temp: 98.4 F (36.9 C) 98.3 F (36.8 C) 98.7 F (37.1 C) 98.6 F (37 C)  TempSrc: Oral Oral Oral Oral  SpO2: 97% 96% 94% 97%  Weight:      Height:        Intake/Output Summary (Last 24 hours) at 06/14/2022 1217 Last data filed at 06/14/2022 1046 Gross per 24 hour  Intake 342.96 ml  Output 1740 ml  Net -1397.04 ml    Wt Readings from Last 3 Encounters:  06/05/22 106.6 kg   05/29/22 106.6 kg  04/30/21 120.2 kg    Examination:  Constitutional: NAD Eyes: lids and conjunctivae normal, no scleral icterus ENMT: mmm Neck: normal, supple Respiratory: clear to auscultation bilaterally, no wheezing, no crackles. Normal respiratory effort.  Cardiovascular: Regular rate and rhythm, no murmurs / rubs / gallops. No LE edema. Skin: no rashes Neurologic: no focal deficits, equal strength  Data Reviewed: I have independently reviewed following labs and imaging studies   CBC Recent Labs  Lab 06/09/22 0409 06/10/22 0559 06/11/22 0432 06/12/22 0436 06/13/22 0450 06/14/22 0400  WBC 16.5* 20.7* 19.3* 15.1* 13.4* 12.4*  HGB 9.8* 10.1* 9.2* 9.3* 9.2* 8.9*  HCT 31.5* 32.1* 28.2* 29.1* 28.8* 28.6*  PLT 317 359 331 387 443* 473*  MCV 92.9 90.4 90.4 90.1 91.1 91.1  MCH 28.9 28.5 29.5 28.8 29.1 28.3  MCHC 31.1 31.5 32.6 32.0 31.9 31.1  RDW 15.3 15.0 15.0 14.8 15.1 15.1  LYMPHSABS 1.6 1.9  --   --   --   --   MONOABS 1.2* 1.3*  --   --   --   --   EOSABS 0.3 0.3  --   --   --   --   BASOSABS 0.1 0.1  --   --   --   --      Recent Labs  Lab 06/08/22 0355 06/09/22 0409 06/10/22 0559 06/11/22 0432 06/12/22 0436 06/13/22 0450 06/14/22 0400  NA 139 140 138 137 139 139 141  K 3.8 4.0 3.8 3.9 3.8 3.7 3.5  CL 102 101 98 101 102 104 104  CO2 32 '31 31 28 28 28 29  '$ GLUCOSE 98 86 103* 103* 107* 105* 111*  BUN '11 9 7 '$ 5* <5* <5* 5*  CREATININE 0.64 0.65 0.65 0.55 0.60 0.52 0.76  CALCIUM 7.7* 7.8* 8.1* 8.1* 8.2* 8.2* 8.1*  AST 53* 47* 33 21 15  --   --   ALT 24 36 35 26 23  --   --   ALKPHOS 94 103 74 72 76  --   --   BILITOT 0.7 0.7 0.8 1.0 1.1  --   --   ALBUMIN 2.1* 2.1* 2.2* 2.2* 2.1*  --   --   MG  --  2.1 2.1  --  2.0  --   --      ------------------------------------------------------------------------------------------------------------------ No results for input(s): "CHOL", "HDL", "LDLCALC", "TRIG", "CHOLHDL", "LDLDIRECT" in the last 72 hours.  Lab  Results  Component Value Date   HGBA1C 5.6 06/01/2022   ------------------------------------------------------------------------------------------------------------------ No results for input(s): "TSH", "T4TOTAL", "T3FREE", "THYROIDAB" in the last 72 hours.  Invalid input(s): "FREET3"  Cardiac Enzymes No results for input(s): "CKMB", "TROPONINI", "MYOGLOBIN" in the last 168 hours.  Invalid input(s): "CK" ------------------------------------------------------------------------------------------------------------------ No results found for: "BNP"  CBG: Recent Labs  Lab 06/13/22 0022 06/13/22 0736 06/13/22 1630 06/14/22 0027 06/14/22 0736  GLUCAP 129* 107* 134* 117* 115*     Recent Results (from the past 240 hour(s))  Aerobic/Anaerobic Culture w Gram Stain (surgical/deep wound)     Status: None (Preliminary result)   Collection Time: 06/11/22  2:14 PM   Specimen: Abscess  Result Value Ref Range Status   Specimen Description   Final    ABSCESS Performed at [facility] [address], [place], [place] [address]    Special Requests   Final    ABDOMEN Performed at [facility], [place] [address], [place], [place] [address]    Gram Stain NO WBC SEEN NO ORGANISMS SEEN   Final   Culture   Final    NO GROWTH 2 DAYS NO ANAEROBES ISOLATED; CULTURE IN PROGRESS FOR 5 DAYS Performed at [facility] [address] [place], [place] [address]    Report Status PENDING  Incomplete     Radiology Studies: No results found.   Marzetta Board, MD, PhD [facility]  Between 7 am - 7 pm I am available, please contact me via Amion (for emergencies) or Securechat (non urgent messages)  Between 7 pm - 7 am I am not available, please contact night coverage MD/APP via Amion

## 2022-06-14 NOTE — Progress Notes (Signed)
9 Days Post-Op   Subjective/Chief Complaint: Feeling better, tolerating a diet and having stool from ostomy, ambulating some   Objective: Vital signs in last 24 hours: Temp:  [98.6 F (37 C)-98.7 F (37.1 C)] 98.6 F (37 C) (07/09 0541) Pulse Rate:  [95] 95 (07/09 0541) Resp:  [16-18] 18 (07/09 0541) BP: (95-103)/(61-77) 103/77 (07/09 0541) SpO2:  [94 %-97 %] 97 % (07/09 0541) Last BM Date : 06/12/22 (through ostomy)  Intake/Output from previous day: 07/08 0701 - 07/09 0700 In: 970.3 [P.O.:480; I.V.:337.4; IV Piggyback:143] Out: 1905 [Urine:1800; Drains:40; Stool:65] Intake/Output this shift: No intake/output data recorded.  General appearance: alert and cooperative GI: soft, moderate tenderness on L side. Ostomy viable with some air and stool in bag JP: SS fluid only  Lab Results:  Recent Labs    06/13/22 0450 06/14/22 0400  WBC 13.4* 12.4*  HGB 9.2* 8.9*  HCT 28.8* 28.6*  PLT 443* 473*    BMET Recent Labs    06/13/22 0450 06/14/22 0400  NA 139 141  K 3.7 3.5  CL 104 104  CO2 28 29  GLUCOSE 105* 111*  BUN <5* 5*  CREATININE 0.52 0.76  CALCIUM 8.2* 8.1*    PT/INR No results for input(s): "LABPROT", "INR" in the last 72 hours. ABG No results for input(s): "PHART", "HCO3" in the last 72 hours.  Invalid input(s): "PCO2", "PO2"  Studies/Results: No results found.  Anti-infectives: Anti-infectives (From admission, onward)    Start     Dose/Rate Route Frequency Ordered Stop   06/03/22 1400  piperacillin-tazobactam (ZOSYN) IVPB 3.375 g        3.375 g 12.5 mL/hr over 240 Minutes Intravenous Every 8 hours 06/03/22 0900 06/16/22 0832   06/02/22 0000  cefTRIAXone (ROCEPHIN) 2 g in sodium chloride 0.9 % 100 mL IVPB  Status:  Discontinued        2 g 200 mL/hr over 30 Minutes Intravenous Every 24 hours 06/01/22 1302 06/03/22 0856   06/01/22 1400  metroNIDAZOLE (FLAGYL) IVPB 500 mg  Status:  Discontinued        500 mg 100 mL/hr over 60 Minutes Intravenous  Every 12 hours 06/01/22 1302 06/03/22 0856   06/01/22 1200  cefTRIAXone (ROCEPHIN) 2 g in sodium chloride 0.9 % 100 mL IVPB  Status:  Discontinued        2 g 200 mL/hr over 30 Minutes Intravenous Every 24 hours 06/01/22 0432 06/01/22 0801   06/01/22 0900  Ampicillin-Sulbactam (UNASYN) 3 g in sodium chloride 0.9 % 100 mL IVPB  Status:  Discontinued        3 g 200 mL/hr over 30 Minutes Intravenous Every 6 hours 06/01/22 0801 06/01/22 1302   06/01/22 0030  cefTRIAXone (ROCEPHIN) 1 g in sodium chloride 0.9 % 100 mL IVPB        1 g 200 mL/hr over 30 Minutes Intravenous  Once 06/01/22 0027 06/01/22 0155   06/01/22 0030  metroNIDAZOLE (FLAGYL) IVPB 500 mg  Status:  Discontinued        500 mg 100 mL/hr over 60 Minutes Intravenous Every 12 hours 06/01/22 0027 06/01/22 0801       Assessment/Plan: s/p Procedure(s): TOTAL COLECTOMY (N/A) Cont soft foods Continue zosyn until 7/11 and then stop Cont vac for subcutaneous wound Ambulate Intraabdominal fluid on CT. IR drain placed, most likely can be removed prior to d/c   LOS: 13 days    Rosario Adie 5/0/5397

## 2022-06-14 NOTE — Plan of Care (Signed)
  Problem: Health Behavior/Discharge Planning: Goal: Ability to manage health-related needs will improve Outcome: Progressing   Problem: Clinical Measurements: Goal: Ability to maintain clinical measurements within normal limits will improve Outcome: Progressing Goal: Diagnostic test results will improve Outcome: Progressing   Problem: Elimination: Goal: Will not experience complications related to bowel motility Outcome: Progressing   Problem: Pain Managment: Goal: General experience of comfort will improve Outcome: Progressing

## 2022-06-15 DIAGNOSIS — K5720 Diverticulitis of large intestine with perforation and abscess without bleeding: Secondary | ICD-10-CM | POA: Diagnosis not present

## 2022-06-15 LAB — GLUCOSE, CAPILLARY
Glucose-Capillary: 118 mg/dL — ABNORMAL HIGH (ref 70–99)
Glucose-Capillary: 119 mg/dL — ABNORMAL HIGH (ref 70–99)
Glucose-Capillary: 123 mg/dL — ABNORMAL HIGH (ref 70–99)
Glucose-Capillary: 132 mg/dL — ABNORMAL HIGH (ref 70–99)

## 2022-06-15 MED ORDER — ACETAMINOPHEN 500 MG PO TABS
1000.0000 mg | ORAL_TABLET | Freq: Four times a day (QID) | ORAL | Status: DC
Start: 2022-06-15 — End: 2022-07-02
  Administered 2022-06-15 – 2022-07-01 (×62): 1000 mg via ORAL
  Filled 2022-06-15 (×63): qty 2

## 2022-06-15 MED ORDER — HYDROMORPHONE HCL 1 MG/ML IJ SOLN
0.5000 mg | INTRAMUSCULAR | Status: DC | PRN
  Administered 2022-06-16 – 2022-07-01 (×16): 0.5 mg via INTRAVENOUS
  Filled 2022-06-15 (×16): qty 0.5

## 2022-06-15 MED ORDER — METHOCARBAMOL 500 MG PO TABS
500.0000 mg | ORAL_TABLET | Freq: Four times a day (QID) | ORAL | Status: DC
  Administered 2022-06-15 – 2022-07-01 (×65): 500 mg via ORAL
  Filled 2022-06-15 (×64): qty 1

## 2022-06-15 NOTE — TOC Progression Note (Incomplete Revision)
Transition of Care [facility]) - Progression Note    Patient Details  Name: Morgan Cline MRN: [id number] Date of Birth: [birth year]  [facility]) CM/SW Contact  Leeroy Cha, RN Phone Number: 06/15/2022, 1:55 PM  Clinical Narrative:    [facility] for [facility]unable to do [facility] Unable to do due to wound vac [facility] unable to take insurance. Sent to [facility].  Unable to take insurance [facility] to take insurance  [facility] Expected Discharge Plan and Services                                                 Social Determinants of Health (SDOH) Interventions    Readmission Risk Interventions    06/01/2022   11:45 AM  Readmission Risk Prevention Plan  Post Dischage Appt Complete  Medication Screening Complete  Transportation Screening Complete

## 2022-06-15 NOTE — Progress Notes (Signed)
Patient ID: Morgan Cline, female   DOB: 1970/12/17, 51 y.o.   MRN: [id number] [facility] Surgery Progress Note  10 Days Post-Op  Subjective: CC-  Abdomen sore but overall improving. Denie sn/v. Colostomy functioning. Tolerating diet.  Objective: Vital signs in last 24 hours: Temp:  [97.7 F (36.5 C)-98.5 F (36.9 C)] 98 F (36.7 C) (07/10 0558) Pulse Rate:  [79-100] 79 (07/10 0558) Resp:  [18-20] 20 (07/10 0558) BP: (101-104)/(66-77) 102/71 (07/10 0558) SpO2:  [97 %-99 %] 98 % (07/10 0558) Last BM Date : 06/12/22 (through ostomy)  Intake/Output from previous day: 07/09 0701 - 07/10 0700 In: 10  Out: 1140 [Urine:1000; Drains:40; Stool:100] Intake/Output this shift: No intake/output data recorded.  PE: General appearance: alert and cooperative GI: soft, appropriately tender, ostomy viable with some air and stool in bag. Midline wound pictured below, healthy granulation tissue in both openings without purulent drainage, staples present in the central aspect of the wound    Lab Results:  Recent Labs    06/13/22 0450 06/14/22 0400  WBC 13.4* 12.4*  HGB 9.2* 8.9*  HCT 28.8* 28.6*  PLT 443* 473*   BMET Recent Labs    06/13/22 0450 06/14/22 0400  NA 139 141  K 3.7 3.5  CL 104 104  CO2 28 29  GLUCOSE 105* 111*  BUN <5* 5*  CREATININE 0.52 0.76  CALCIUM 8.2* 8.1*   PT/INR No results for input(s): "LABPROT", "INR" in the last 72 hours. CMP     Component Value Date/Time   NA 141 06/14/2022 0400   K 3.5 06/14/2022 0400   CL 104 06/14/2022 0400   CO2 29 06/14/2022 0400   GLUCOSE 111 (H) 06/14/2022 0400   BUN 5 (L) 06/14/2022 0400   CREATININE 0.76 06/14/2022 0400   CALCIUM 8.1 (L) 06/14/2022 0400   PROT 5.7 (L) 06/12/2022 0436   ALBUMIN 2.1 (L) 06/12/2022 0436   AST 15 06/12/2022 0436   ALT 23 06/12/2022 0436   ALKPHOS 76 06/12/2022 0436   BILITOT 1.1 06/12/2022 0436   GFRNONAA >60 06/14/2022 0400   GFRAA >90 11/16/2011 1455   Lipase      Component Value Date/Time   LIPASE 23 05/31/2022 2224       Studies/Results: No results found.  Anti-infectives: Anti-infectives (From admission, onward)    Start     Dose/Rate Route Frequency Ordered Stop   06/03/22 1400  piperacillin-tazobactam (ZOSYN) IVPB 3.375 g        3.375 g 12.5 mL/hr over 240 Minutes Intravenous Every 8 hours 06/03/22 0900 06/16/22 0832   06/02/22 0000  cefTRIAXone (ROCEPHIN) 2 g in sodium chloride 0.9 % 100 mL IVPB  Status:  Discontinued        2 g 200 mL/hr over 30 Minutes Intravenous Every 24 hours 06/01/22 1302 06/03/22 0856   06/01/22 1400  metroNIDAZOLE (FLAGYL) IVPB 500 mg  Status:  Discontinued        500 mg 100 mL/hr over 60 Minutes Intravenous Every 12 hours 06/01/22 1302 06/03/22 0856   06/01/22 1200  cefTRIAXone (ROCEPHIN) 2 g in sodium chloride 0.9 % 100 mL IVPB  Status:  Discontinued        2 g 200 mL/hr over 30 Minutes Intravenous Every 24 hours 06/01/22 0432 06/01/22 0801   06/01/22 0900  Ampicillin-Sulbactam (UNASYN) 3 g in sodium chloride 0.9 % 100 mL IVPB  Status:  Discontinued        3 g 200 mL/hr over 30 Minutes Intravenous Every 6  hours 06/01/22 0801 06/01/22 1302   06/01/22 0030  cefTRIAXone (ROCEPHIN) 1 g in sodium chloride 0.9 % 100 mL IVPB        1 g 200 mL/hr over 30 Minutes Intravenous  Once 06/01/22 0027 06/01/22 0155   06/01/22 0030  metroNIDAZOLE (FLAGYL) IVPB 500 mg  Status:  Discontinued        500 mg 100 mL/hr over 60 Minutes Intravenous Every 12 hours 06/01/22 0027 06/01/22 0801        Assessment/Plan Perforated diverticulitis with multiple abscesses -POD#10 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 Barry Dienes - path consistent with diverticulitis  - continue vac MWF. Plan to change again Wednesday, will need to try with oral pain medication. We may be able to remove staples that day. I will place [facility] consult to see if home vac will be an option - WOC  following for new ostomy - needs more education, they are seeing her today - s/p IR drain 7/6. Culture negative to date. May be able to remove drain prior to discharge - per IR - schedule robaxin and tylenol for improved pain control, wean dilaudid  ID - zosyn 6/28>>7/11 FEN - soft diet VTE - lovenox Foley - none     LOS: 14 days    Wellington Hampshire, [facility] Surgery 06/15/2022, 11:07 AM Please see Amion for pager number during day hours 7:00am-4:30pm

## 2022-06-15 NOTE — TOC Progression Note (Addendum)
Transition of Care [facility]) - Progression Note    Patient Details  Name: KARIN PINEDO MRN: [id number] Date of Birth: Aug 15, 1971  Transition of [facility]) CM/SW Contact  Leeroy Cha, RN Phone Number: 06/15/2022, 1:55 PM  Clinical Narrative:    [facility] for [facility]n [facility]unable to do [facility] Unable to do due to wound vac [facility] unable to take insurance. Sent to [facility].  Unable to take insurance Sent to [facility] Unable to take insurance  [facility] unable to take due to wound vac [facility] unable to take insurance. [facility]-does only hospice patients.  [facility] Expected Discharge Plan and Services                                                 Social Determinants of Health (SDOH) Interventions    Readmission Risk Interventions    06/01/2022   11:45 AM  Readmission Risk Prevention Plan  Post Dischage Appt Complete  Medication Screening Complete  Transportation Screening Complete

## 2022-06-15 NOTE — Consult Note (Signed)
[place] Nurse wound follow up Wound type: s/p sigmoid colectomy midline incision Wound bed: red, granulating Drainage (amount, consistency, odor): serosanguineous Periwound: staples between the upper and lower wounds that are intact  Dressing procedure/placement/frequency:  Removed old NPWT dressing Filled wound with  _5__ piece of black foam, 2 pieces to the proximal wound, 2 pieces to the distal wound and 1 piece to bridge. Ostomy barrier ring applied in abdominal crease. Mepitel used to cover staples at level of umbilicus.  Sealed NPWT dressing at 170m HG Patient received IV pain medication per bedside nurse prior to dressing change Patient tolerated procedure well  WOC nurse will continue to provide NPWT dressing changed due to the complexity of the dressing change.   [place] ost[place] follow up Stoma type/location: End colostomy/ LUQ Stomal assessment/size: red, below skin level/ 1 5/8inch  horizontally, 1 1/4 inch vertically ( new pattern left at bedside for pt) Peristomal assessment: intact Treatment options for stomal/peristomal skin: barrier ring Output: 125 cc green Ostomy pouching: 1pc flex convex pouch with barrier ring Education provided:  Explained stoma characteristics (budded, flush, color, texture, care)  Demonstrated pouch change (cutting new skin barrier, measuring stoma, cleaning peristomal skin and stoma, use of barrier ring) Pt given opportunity to cut skin barrier and practiced measuring stoma. Pt cleaned stoma and peristomal skin.  Demonstrated use of wick to clean spout  Discussed rationale for flex convex and for removing viewing window while she is learning to perform care Patient is a CNA, she has basic ostomy care skills already    [place] No, previously enrolled  Student WOC, [place] [facility], [facility]  WOC Nurse will follow along with you for continued support with ostomy teaching  MFrombergMSN, R[place]  [employer] [place]

## 2022-06-15 NOTE — Progress Notes (Signed)
PROGRESS NOTE  Morgan Cline TDS:[id number] DOB: Sep 27, 1971 DOA: 05/31/2022 PCP: [facility]   LOS: 14 days   Brief Narrative / Interim history: 51 year old female with history of prediabetes, obesity, comes to the hospital with abdominal pain.  She was recently seen in the ED couple days prior to admission with sigmoid diverticulitis, she was given Augmentin and sent home, however returned due to worsening pain.  A CT scan showed worsening perforated diverticulitis with abscess formation.  General surgery was consulted and eventually underwent Hartman's procedure on 6/30.  Subjective / 24h Interval events: Seen during wound VAC dressing changed today.  Surgery at bedside.  Has some pain.  No nausea or vomiting.  Assesement and Plan: Principal Problem:   Perforation of sigmoid colon due to diverticulitis Active Problems:   Obesity, Class III, BMI 40-49.9 (morbid obesity) (HCC)   Prediabetes   Hyponatremia   Hypokalemia   Bandemia   Normocytic anemia   Principal problem Sigmoid colon perforation due to diverticulitis, multiple abscesses, postop ileus-appreciate general surgery follow-up.  She is status post sigmoid colectomy and end colostomy, takedown of splenic flexure by Dr. Barry Dienes on 6/30.  Postop course complicated by ileus, NG tube, now resolved and NG tube was removed in the interval.  She had persistent WBC elevation and underwent a CT scan of the abdomen and pelvis 7/5 which showed left-sided fluid collection.  She is status post IR guided drainage on 7/6.  WBC continues to improve, cultures without growth.  On Zosyn up until 7/11 -Next wound VAC dressing change would be on Wednesday, surgery will look into home wound VAC if possible.  Active problems  Hyponatremia-sodium has normalized, monitor periodically   Hypokalemia -p recheck potassium tomorrow   Thrombocytosis -stable  Normocytic Anemia -no bleeding, monitor  Pre-Diabetes-CBGs with good  control  CBG (last 3)  Recent Labs    06/14/22 1640 06/14/22 2359 06/15/22 0738  GLUCAP 120* 132* 118*    Obesity-BMI 40.  She would benefit from weight loss  Scheduled Meds:  acetaminophen  1,000 mg Oral Q6H   enoxaparin (LOVENOX) injection  40 mg Subcutaneous Q24H   feeding supplement  237 mL Oral BID BM   hydrocortisone cream   Topical QID   methocarbamol  500 mg Oral Q6H   sodium chloride flush  5 mL Intracatheter Q8H   Continuous Infusions:  piperacillin-tazobactam (ZOSYN)  IV 3.375 g (06/15/22 0545)   promethazine (PHENERGAN) injection (IM or IVPB)     PRN Meds:.alum & mag hydroxide-simeth, HYDROmorphone (DILAUDID) injection, menthol-cetylpyridinium, ondansetron (ZOFRAN) IV, mouth rinse, oxyCODONE, phenol, promethazine (PHENERGAN) injection (IM or IVPB)  Diet Orders (From admission, onward)     Start     Ordered   06/13/22 0830  DIET SOFT Room service appropriate? Yes; Fluid consistency: Thin  Diet effective now       Question Answer Comment  Room service appropriate? Yes   Fluid consistency: Thin      06/13/22 0830            DVT prophylaxis: enoxaparin (LOVENOX) injection 40 mg Start: 06/03/22 2200 SCDs Start: 06/01/22 0431   Lab Results  Component Value Date   PLT 473 (H) 06/14/2022      Code Status: Full Code  Family Communication: no family at bedside   Status is: Inpatient  Remains inpatient appropriate because: home later this week when cleared by surgery   Level of care: Progressive  Consultants:  General surgery  Objective: Vitals:   06/14/22 0541 06/14/22  1221 06/14/22 2328 06/15/22 0558  BP: 103/77 104/77 101/66 102/71  Pulse: 95 100 93 79  Resp: '18 20 18 20  '$ Temp: 98.6 F (37 C) 98.5 F (36.9 C) 97.7 F (36.5 C) 98 F (36.7 C)  TempSrc: Oral Oral Oral   SpO2: 97% 97% 99% 98%  Weight:      Height:        Intake/Output Summary (Last 24 hours) at 06/15/2022 1220 Last data filed at 06/15/2022 0545 Gross per 24 hour   Intake 10 ml  Output 840 ml  Net -830 ml    Wt Readings from Last 3 Encounters:  06/05/22 106.6 kg  05/29/22 106.6 kg  04/30/21 120.2 kg    Examination:  Constitutional: NAD Eyes: lids and conjunctivae normal, no scleral icterus ENMT: mmm Neck: normal, supple Respiratory: clear to auscultation bilaterally, no wheezing, no crackles. Normal respiratory effort.  Cardiovascular: Regular rate and rhythm, no murmurs / rubs / gallops. No LE edema. Abdomen: soft, no distention, no tenderness. Bowel sounds positive.  Skin: no rashes Neurologic: no focal deficits, equal strength  Data Reviewed: I have independently reviewed following labs and imaging studies   CBC Recent Labs  Lab 06/09/22 0409 06/10/22 0559 06/11/22 0432 06/12/22 0436 06/13/22 0450 06/14/22 0400  WBC 16.5* 20.7* 19.3* 15.1* 13.4* 12.4*  HGB 9.8* 10.1* 9.2* 9.3* 9.2* 8.9*  HCT 31.5* 32.1* 28.2* 29.1* 28.8* 28.6*  PLT 317 359 331 387 443* 473*  MCV 92.9 90.4 90.4 90.1 91.1 91.1  MCH 28.9 28.5 29.5 28.8 29.1 28.3  MCHC 31.1 31.5 32.6 32.0 31.9 31.1  RDW 15.3 15.0 15.0 14.8 15.1 15.1  LYMPHSABS 1.6 1.9  --   --   --   --   MONOABS 1.2* 1.3*  --   --   --   --   EOSABS 0.3 0.3  --   --   --   --   BASOSABS 0.1 0.1  --   --   --   --      Recent Labs  Lab 06/09/22 0409 06/10/22 0559 06/11/22 0432 06/12/22 0436 06/13/22 0450 06/14/22 0400  NA 140 138 137 139 139 141  K 4.0 3.8 3.9 3.8 3.7 3.5  CL 101 98 101 102 104 104  CO2 '31 31 28 28 28 29  '$ GLUCOSE 86 103* 103* 107* 105* 111*  BUN 9 7 5* <5* <5* 5*  CREATININE 0.65 0.65 0.55 0.60 0.52 0.76  CALCIUM 7.8* 8.1* 8.1* 8.2* 8.2* 8.1*  AST 47* 33 21 15  --   --   ALT 36 35 26 23  --   --   ALKPHOS 103 74 72 76  --   --   BILITOT 0.7 0.8 1.0 1.1  --   --   ALBUMIN 2.1* 2.2* 2.2* 2.1*  --   --   MG 2.1 2.1  --  2.0  --   --      ------------------------------------------------------------------------------------------------------------------ No  results for input(s): "CHOL", "HDL", "LDLCALC", "TRIG", "CHOLHDL", "LDLDIRECT" in the last 72 hours.  Lab Results  Component Value Date   HGBA1C 5.6 06/01/2022   ------------------------------------------------------------------------------------------------------------------ No results for input(s): "TSH", "T4TOTAL", "T3FREE", "THYROIDAB" in the last 72 hours.  Invalid input(s): "FREET3"  Cardiac Enzymes No results for input(s): "CKMB", "TROPONINI", "MYOGLOBIN" in the last 168 hours.  Invalid input(s): "CK" ------------------------------------------------------------------------------------------------------------------ No results found for: "BNP"  CBG: Recent Labs  Lab 06/14/22 0027 06/14/22 0736 06/14/22 1640 06/14/22 2359 06/15/22 0738  GLUCAP 117*  115* 120* 132* 118*     Recent Results (from the past 240 hour(s))  Aerobic/Anaerobic Culture w Gram Stain (surgical/deep wound)     Status: None (Preliminary result)   Collection Time: 06/11/22  2:14 PM   Specimen: Abscess  Result Value Ref Range Status   Specimen Description   Final    ABSCESS Performed at [facility] [address], [place], [place] [address]    Special Requests   Final    ABDOMEN Performed at [facility], [place] [address] [place], [place] [address]    Gram Stain NO WBC SEEN NO ORGANISMS SEEN   Final   Culture   Final    NO GROWTH 4 DAYS NO ANAEROBES ISOLATED; CULTURE IN PROGRESS FOR 5 DAYS Performed at [facility] [address], [place], [place] [address]    Report Status PENDING  Incomplete     Radiology Studies: No results found.   Marzetta Board, MD, PhD [facility]  Between 7 am - 7 pm I am available, please contact me via Amion (for emergencies) or Securechat (non urgent messages)  Between 7 pm - 7 am I am not available, please contact night coverage MD/APP via Amion

## 2022-06-15 NOTE — Plan of Care (Signed)
  Problem: Clinical Measurements: Goal: Ability to maintain clinical measurements within normal limits will improve Outcome: Progressing Goal: Will remain free from infection Outcome: Progressing Goal: Diagnostic test results will improve Outcome: Progressing   Problem: Activity: Goal: Risk for activity intolerance will decrease Outcome: Progressing   Problem: Coping: Goal: Level of anxiety will decrease Outcome: Progressing   Problem: Pain Managment: Goal: General experience of comfort will improve Outcome: Progressing

## 2022-06-15 NOTE — TOC Progression Note (Signed)
Transition of Care [facility]) - Progression Note    Patient Details  Name: Morgan Cline MRN: [id number] Date of Birth: Jul 01, 1971  Transition [facility] Care [facility]) CM/SW Contact  Leeroy Cha, RN Phone Number: 06/15/2022, 1:07 PM  Clinical Narrative:    [id number]/tct-tracey barrow with [facility] about the wound vac.  Will get in touch with pa for [place] surgery to get script signed.  Tex to pa merdith sent to alert need for signsture.        Expected Discharge Plan and Services                                                 Social Determinants of Health (SDOH) Interventions    Readmission Risk Interventions    06/01/2022   11:45 AM  Readmission Risk Prevention Plan  Post Dischage Appt Complete  Medication Screening Complete  Transportation Screening Complete

## 2022-06-16 ENCOUNTER — Encounter (HOSPITAL_COMMUNITY): Payer: Self-pay | Admitting: Internal Medicine

## 2022-06-16 ENCOUNTER — Inpatient Hospital Stay (HOSPITAL_COMMUNITY): Payer: Federal, State, Local not specified - PPO

## 2022-06-16 DIAGNOSIS — K5720 Diverticulitis of large intestine with perforation and abscess without bleeding: Secondary | ICD-10-CM | POA: Diagnosis not present

## 2022-06-16 LAB — CBC
HCT: 27.3 % — ABNORMAL LOW (ref 36.0–46.0)
Hemoglobin: 8.6 g/dL — ABNORMAL LOW (ref 12.0–15.0)
MCH: 28.6 pg (ref 26.0–34.0)
MCHC: 31.5 g/dL (ref 30.0–36.0)
MCV: 90.7 fL (ref 80.0–100.0)
Platelets: 477 10*3/uL — ABNORMAL HIGH (ref 150–400)
RBC: 3.01 MIL/uL — ABNORMAL LOW (ref 3.87–5.11)
RDW: 15 % (ref 11.5–15.5)
WBC: 11.6 10*3/uL — ABNORMAL HIGH (ref 4.0–10.5)
nRBC: 0 % (ref 0.0–0.2)

## 2022-06-16 LAB — AEROBIC/ANAEROBIC CULTURE W GRAM STAIN (SURGICAL/DEEP WOUND)
Culture: NO GROWTH
Gram Stain: NONE SEEN

## 2022-06-16 LAB — COMPREHENSIVE METABOLIC PANEL
ALT: 20 U/L (ref 0–44)
AST: 17 U/L (ref 15–41)
Albumin: 2.2 g/dL — ABNORMAL LOW (ref 3.5–5.0)
Alkaline Phosphatase: 87 U/L (ref 38–126)
Anion gap: 6 (ref 5–15)
BUN: 9 mg/dL (ref 6–20)
CO2: 28 mmol/L (ref 22–32)
Calcium: 8 mg/dL — ABNORMAL LOW (ref 8.9–10.3)
Chloride: 105 mmol/L (ref 98–111)
Creatinine, Ser: 0.67 mg/dL (ref 0.44–1.00)
GFR, Estimated: >60 mL/min (ref >60)
Glucose, Bld: 129 mg/dL — ABNORMAL HIGH (ref 70–99)
Potassium: 3.6 mmol/L (ref 3.5–5.1)
Sodium: 139 mmol/L (ref 135–145)
Total Bilirubin: 0.5 mg/dL (ref 0.3–1.2)
Total Protein: 6 g/dL — ABNORMAL LOW (ref 6.5–8.1)

## 2022-06-16 LAB — GLUCOSE, CAPILLARY
Glucose-Capillary: 119 mg/dL — ABNORMAL HIGH (ref 70–99)
Glucose-Capillary: 136 mg/dL — ABNORMAL HIGH (ref 70–99)

## 2022-06-16 MED ORDER — IOHEXOL 300 MG/ML  SOLN
100.0000 mL | Freq: Once | INTRAMUSCULAR | Status: AC | PRN
  Administered 2022-06-16: 100 mL via INTRAVENOUS

## 2022-06-16 NOTE — NC FL2 (Signed)
[place] LEVEL OF CARE SCREENING TOOL     IDENTIFICATION  Patient Name: Morgan Cline Birthdate: [birth year] Sex: female Admission Date (Current Location): 05/31/2022  [facility] and [place] Number:  Herbalist and Address:  [facility],  [place] [address] [place]      Provider Number: [id number]  Attending Physician Name and Address:  Caren Griffins, MD  Relative Name and Phone Number:       Current Level of Care: Hospital Recommended Level of Care: [place] Prior Approval Number:    Date Approved/Denied:   PASRR Number: [id number]  Discharge Plan: SNF    Current Diagnoses: Patient Active Problem List   Diagnosis Date Noted   Normocytic anemia 06/06/2022   Bandemia 06/05/2022   Hypokalemia 06/03/2022   Hyponatremia 06/02/2022   Perforation of sigmoid colon due to diverticulitis 06/01/2022   Chronic migraine without aura without status migrainosus, not intractable 06/15/2019   Prediabetes 06/14/2019   Primary osteoarthritis of left knee 08/07/2018   IUD check up 08/11/2016   Obesity, Class III, BMI 40-49.9 (morbid obesity) ([place]) 06/30/2013   Vitamin D deficiency 06/30/2013   Disorder of menstrual bleeding 06/30/2013    Orientation RESPIRATION BLADDER Height & Weight     Self, Time, Situation, Place  Normal Continent Weight: 106.6 kg Height:  '5\' 4"'$  (162.6 cm)  BEHAVIORAL SYMPTOMS/MOOD NEUROLOGICAL BOWEL NUTRITION STATUS      Continent Diet (regular)  AMBULATORY STATUS COMMUNICATION OF NEEDS Skin   Supervision Verbally Wound Vac, Surgical wounds                       Personal Care Assistance Level of Assistance  Bathing, Feeding, Dressing Bathing Assistance: Limited assistance Feeding assistance: Limited assistance Dressing Assistance: Limited assistance     Functional Limitations Info  Sight, Hearing, Speech Sight Info: Adequate Hearing Info: Adequate Speech Info: Adequate     SPECIAL CARE FACTORS FREQUENCY                       Contractures Contractures Info: Not present    Additional Factors Info  Code Status Code Status Info: full             Current Medications (06/16/2022):  This is the current hospital active medication list Current Facility-Administered Medications  Medication Dose Route Frequency Provider Last Rate Last Admin   acetaminophen (TYLENOL) tablet 1,000 mg  1,000 mg Oral Q6H Meuth, Brooke A, PA-C   1,000 mg at 06/16/22 0106   alum & mag hydroxide-simeth (MAALOX/MYLANTA) 200-200-20 MG/5ML suspension 30 mL  30 mL Oral Q4H PRN Jillyn Ledger, PA-C   30 mL at 06/05/22 0533   enoxaparin (LOVENOX) injection 40 mg  40 mg Subcutaneous Q24H Jillyn Ledger, PA-C   40 mg at 06/15/22 2101   feeding supplement (ENSURE ENLIVE / ENSURE PLUS) liquid 237 mL  237 mL Oral BID BM Jill Alexanders, PA-C   237 mL at 06/13/22 1404   hydrocortisone cream 0.5 %   Topical QID Mercy Riding, MD   Given at 06/15/22 2102   HYDROmorphone (DILAUDID) injection 0.5 mg  0.5 mg Intravenous Q4H PRN Meuth, Brooke A, PA-C       menthol-cetylpyridinium (CEPACOL) lozenge 3 mg  1 lozenge Oral PRN Shelda Pal, DO   3 mg at 06/10/22 1031   methocarbamol (ROBAXIN) tablet 500 mg  500 mg Oral Q6H Meuth, Brooke A, PA-C  500 mg at 06/16/22 0620   ondansetron (ZOFRAN) injection 4 mg  4 mg Intravenous Q6H PRN Jillyn Ledger, PA-C   4 mg at 06/04/22 1315   Oral care mouth rinse  15 mL Mouth Rinse PRN Wendee Beavers T, MD       oxyCODONE (Oxy IR/ROXICODONE) immediate release tablet 5-10 mg  5-10 mg Oral U9N PRN Leighton Ruff, MD   10 mg at 06/16/22 0623   phenol (CHLORASEPTIC) mouth spray 1 spray  1 spray Mouth/Throat PRN Mercy Riding, MD       promethazine (PHENERGAN) 12.5 mg in sodium chloride 0.9 % 50 mL IVPB  12.5 mg Intravenous Q6H PRN Maczis, Barth Kirks, PA-C       sodium chloride flush (NS) 0.9 % injection 5 mL  5 mL Intracatheter Q8H Suttle, Rosanne Ashing, MD   5 mL at 06/15/22 2102     Discharge Medications: Please see discharge summary for a list of discharge medications.  Relevant Imaging Results:  Relevant Lab Results:   Additional Information ssn [id number]  Leeroy Cha, RN

## 2022-06-16 NOTE — TOC Progression Note (Addendum)
Transition of Care [facility]) - Progression Note    Patient Details  Name: Morgan Cline MRN: [id number] Date of Birth: 12-11-1970  Transition [facility]) CM/SW Contact  Leeroy Cha, RN Phone Number: 06/16/2022, 7:33 AM Form for wound vac placed on chart as requested. Clinical Narrative:    Chat note sent to md alerting about difficult of finding ahhc agency that will take her insurance or do the vac care. [facility]- message left to please return call. Tct-[facility] they do dme supplies only no hhc. [facility] unable to to take insurance.  [facility] number rec'd and fl2 sent out to all area snfs  Signed vac form faxed to [place] with [facility]  Expected Discharge Plan and Services                                                 Social Determinants of Health (SDOH) Interventions    Readmission Risk Interventions    06/01/2022   11:45 AM  Readmission Risk Prevention Plan  Post Dischage Appt Complete  Medication Screening Complete  Transportation Screening Complete

## 2022-06-16 NOTE — Progress Notes (Signed)
Referring Physician(s): Cornett,T  Supervising Physician: Daryll Brod  Patient Status:  [facility] - In-pt  Chief Complaint:  Abdominal pain, fluid collection  Subjective: Pt doing ok; denies worsening abd pain,N/V   Allergies: Codeine, Compazine [prochlorperazine], and Pineapple  Medications: Prior to Admission medications   Medication Sig Start Date End Date Taking? Authorizing Provider  amoxicillin-clavulanate (AUGMENTIN) 875-125 MG tablet Take 1 tablet by mouth every 12 (twelve) hours. 05/29/22  Yes Jeanell Sparrow, DO  Aspirin-Salicylamide-Caffeine (BC HEADACHE POWDER PO) Take 1 packet by mouth daily as needed (headaches).   Yes [provider]  diphenhydramine-acetaminophen (TYLENOL PM) 25-500 MG TABS tablet Take 1 tablet by mouth at bedtime as needed (sleep).   Yes [provider]  Fiber Select Gummies CHEW Chew 3 tablets by mouth at bedtime.   Yes [provider]  HYDROcodone-acetaminophen (NORCO) 5-325 MG tablet Take 1 tablet by mouth every 6 (six) hours as needed for moderate pain. 05/29/22  Yes Wynona Dove A, DO  ibuprofen (ADVIL) 600 MG tablet Take 1 tablet (600 mg total) by mouth every 6 (six) hours as needed. Patient taking differently: Take 600 mg by mouth every 6 (six) hours as needed (pain). 05/29/22  Yes Jeanell Sparrow, DO  levonorgestrel (MIRENA) 20 MCG/24HR IUD 1 each by Intrauterine route once. Implanted September 2020   Yes [provider]  ondansetron (ZOFRAN) 4 MG tablet Take 1 tablet (4 mg total) by mouth every 4 (four) hours as needed for nausea or vomiting. 05/29/22  Yes Jeanell Sparrow, DO  Semaglutide, 1 MG/DOSE, (OZEMPIC, 1 MG/DOSE,) 4 MG/3ML SOPN Inject 1 mg into the skin every Sunday.   Yes [provider]     Vital Signs: BP 110/72 (BP Location: Right Arm)   Pulse 97   Temp 98.3 F (36.8 C) (Oral)   Resp 16   Ht '5\' 4"'$  (1.626 m)   Wt 235 lb (106.6 kg)   LMP 06/09/2022   SpO2 97%   BMI 40.34 kg/m    Physical Exam awake/alert; LUQ drain intact, dressing clean and dry, insertion site not sig tender; output 15 cc serous fluid; drain flushed without sig return  Imaging: CT ABDOMEN PELVIS W CONTRAST  Result Date: 06/16/2022 CLINICAL DATA:  s/p LUQ fluid collection status post drain insertion on 06/11/2022, follow-up study EXAM: CT ABDOMEN AND PELVIS WITH CONTRAST TECHNIQUE: Multidetector CT imaging of the abdomen and pelvis was performed using the standard protocol following bolus administration of intravenous contrast. RADIATION DOSE REDUCTION: This exam was performed according to the departmental dose-optimization program which includes automated exposure control, adjustment of the mA and/or kV according to patient size and/or use of iterative reconstruction technique. CONTRAST:  114m OMNIPAQUE IOHEXOL 300 MG/ML  SOLN COMPARISON:  June 10, 2022 FINDINGS: Lower chest: There is consolidation at the left lung base with some air bronchograms and has increased in the interim. Small left pleural effusion seen. Hepatobiliary: No focal liver abnormality is seen. No gallstones, gallbladder wall thickening, or biliary dilatation. Pancreas: Unremarkable. No pancreatic ductal dilatation or surrounding inflammatory changes. Spleen: Normal in size without focal abnormality. Adrenals/Urinary Tract: Adrenal glands are unremarkable. Kidneys are normal, without renal calculi, focal lesion, or hydronephrosis. Bladder is unremarkable. Stomach/Bowel: Again seen are the postsurgical distal colectomy with diverting colostomy left lower quadrant changes. No bowel obstruction. There has been interval removal of the enteric tube. There is some contrast seen in the distal sigmoid and rectum. Vascular/Lymphatic: No significant vascular findings are present. No enlarged abdominal or  pelvic lymph nodes. Reproductive: Stable uterine fibroid and IUD. Other: There are multiple loculated fluid pockets. There has been interval drain  insertion into the fluid collection at the left paracolic gutter. There is residual fluid collection seen along the left paracolic gutter measuring approximally 3.2 cm transverse by 10.3 cm craniocaudad. There is loculated fluid collection seen at the right flank measuring 5.3 x 2.3 x 5.1 cm in comparison to 6.5 x 2.2 x 7.2 cm on the previous study. There is some loculated fluid collection seen at the posterior mesentery in the lower abdomen-upper pelvis measuring approximately 4.7 cm transverse x 1.3 cm AP x 4.5 cm craniocaudad without significant interval change. There is some fluid around the fundus of the uterus and has decreased in the interim measuring approximally 1 cm in the AP dimension in comparison to 2.2 cm on the previous study. There is a small fluid collection in the subcutaneous soft tissues of the left anterior abdominal wall just medial to the ostomy tract without significant interval change. Moderate inflammatory stranding at the left mid to lower anterior abdominal wall. Musculoskeletal: No acute or significant osseous findings. IMPRESSION: 1. Stable distal colectomy with a diverting left lower quadrant colostomy changes. 2. There has been interval drain insertion into the fluid collection along the left paracolic gutter. There is residual fluid collection seen at the catheter insertion site measuring approximately 3.2 cm transverse by 10.3 cm craniocaudad. In addition, there are small multiple loculated fluid pockets seen and have decreased in the interim. The fluid pocket at the right upper flank measures 5.3 x 2.3 x 5.1 cm in comparison to 6.5 x 2.2 x 7.2 cm on the previous study. Small loculated fluid collection at the posterior mesentery in the lower abdomen-upper pelvis is without significant interval change. Fluid collection along the fundus of the uterus has decreased in the interim. 3. There is consolidation at the left lung base with some air bronchograms and has increased in the  interim. Small left pleural effusion. Electronically Signed   By: Frazier Richards M.D.   On: 06/16/2022 13:28    Labs:  CBC: Recent Labs    06/12/22 0436 06/13/22 0450 06/14/22 0400 06/16/22 0425  WBC 15.1* 13.4* 12.4* 11.6*  HGB 9.3* 9.2* 8.9* 8.6*  HCT 29.1* 28.8* 28.6* 27.3*  PLT 387 443* 473* 477*    COAGS: No results for input(s): "INR", "APTT" in the last 8760 hours.  BMP: Recent Labs    06/12/22 0436 06/13/22 0450 06/14/22 0400 06/16/22 0425  NA 139 139 141 139  K 3.8 3.7 3.5 3.6  CL 102 104 104 105  CO2 '28 28 29 28  '$ GLUCOSE 107* 105* 111* 129*  BUN <5* <5* 5* 9  CALCIUM 8.2* 8.2* 8.1* 8.0*  CREATININE 0.60 0.52 0.76 0.67  GFRNONAA >60 >60 >60 >60    LIVER FUNCTION TESTS: Recent Labs    06/10/22 0559 06/11/22 0432 06/12/22 0436 06/16/22 0425  BILITOT 0.8 1.0 1.1 0.5  AST 33 '21 15 17  '$ ALT 35 '26 23 20  '$ ALKPHOS 74 72 76 87  PROT 5.8* 5.5* 5.7* 6.0*  ALBUMIN 2.2* 2.2* 2.1* 2.2*    Assessment and Plan: Pt with hx perf diverticulitis with mult abscesses, s/p sigmoid colectomy with end colostomy, takedown of splenic flexure 6/30; s/p drainage of LUQ fluid collection 7/6 (10 fr to JP); afebrile; WBC 11.6, hgb 8.6, creat 0.67; drain fl cx neg; f/u CT A/P today:   1. Stable distal colectomy with a diverting left lower  quadrant colostomy changes.   2. There has been interval drain insertion into the fluid collection along the left paracolic gutter. There is residual fluid collection seen at the catheter insertion site measuring approximately 3.2 cm transverse by 10.3 cm craniocaudad.   In addition, there are small multiple loculated fluid pockets seen and have decreased in the interim. The fluid pocket at the right upper flank measures 5.3 x 2.3 x 5.1 cm in comparison to 6.5 x 2.2 x 7.2 cm on the previous study. Small loculated fluid collection at the posterior mesentery in the lower abdomen-upper pelvis is without significant interval change. Fluid  collection along the fundus of the uterus has decreased in the interim.   3. There is consolidation at the left lung base with some air bronchograms and has increased in the interim. Small left pleural effusion.  Above d/w CCS; drain fluid appears serous, cx neg, output minimal and may well be ascitic; they will assess pt again in am and decide whether ok to remove LUQ drain   Electronically Signed: D. Rowe Robert, PA-C 06/16/2022, 3:05 PM   I spent a total of 15 Minutes at the the patient's bedside AND on the patient's hospital floor or unit, greater than 50% of which was counseling/coordinating care for left upper abdominal fluid collection drain    Patient ID: Morgan Cline, female   DOB: [birth year], 51 y.o.   MRN: [id number]

## 2022-06-16 NOTE — Progress Notes (Signed)
Patient ID: Morgan Cline, female   DOB: 08-06-1971, 51 y.o.   MRN: [id number] [facility] Progress Note  11 Days Post-Op  Subjective: CC-  Had a lot of pain after vac change yesterday. Feeling ok today. Tolerating diet and colostomy functioning. Denies n/v. Worried about going home because she will have no help. Asking about possibility of rehab.  Objective: Vital signs in last 24 hours: Temp:  [98.9 F (37.2 C)-99.3 F (37.4 C)] 98.9 F (37.2 C) (07/11 0558) Pulse Rate:  [69-98] 69 (07/11 0558) Resp:  [17-18] 18 (07/11 0558) BP: (93-97)/(62-67) 97/67 (07/11 0558) SpO2:  [94 %-97 %] 97 % (07/11 0558) Last BM Date : 06/16/22 (ostomy)  Intake/Output from previous day: 07/10 0701 - 07/11 0700 In: 306.9 [IV Piggyback:296.9] Out: 360 [Urine:300; Drains:10; Stool:50] Intake/Output this shift: No intake/output data recorded.  PE: General appearance: alert and cooperative GI: soft, appropriately tender, ostomy difficult to visualize due to air and stool in bag. Vac to midline with good seal. IR drain serous  Lab Results:  Recent Labs    06/14/22 0400 06/16/22 0425  WBC 12.4* 11.6*  HGB 8.9* 8.6*  HCT 28.6* 27.3*  PLT 473* 477*   BMET Recent Labs    06/14/22 0400 06/16/22 0425  NA 141 139  K 3.5 3.6  CL 104 105  CO2 29 28  GLUCOSE 111* 129*  BUN 5* 9  CREATININE 0.76 0.67  CALCIUM 8.1* 8.0*   PT/INR No results for input(s): "LABPROT", "INR" in the last 72 hours. CMP     Component Value Date/Time   NA 139 06/16/2022 0425   K 3.6 06/16/2022 0425   CL 105 06/16/2022 0425   CO2 28 06/16/2022 0425   GLUCOSE 129 (H) 06/16/2022 0425   BUN 9 06/16/2022 0425   CREATININE 0.67 06/16/2022 0425   CALCIUM 8.0 (L) 06/16/2022 0425   PROT 6.0 (L) 06/16/2022 0425   ALBUMIN 2.2 (L) 06/16/2022 0425   AST 17 06/16/2022 0425   ALT 20 06/16/2022 0425   ALKPHOS 87 06/16/2022 0425   BILITOT 0.5 06/16/2022 0425   GFRNONAA >60 06/16/2022 0425   GFRAA >90  11/16/2011 1455   Lipase     Component Value Date/Time   LIPASE 23 05/31/2022 2224       Studies/Results: No results found.  Anti-infectives: Anti-infectives (From admission, onward)    Start     Dose/Rate Route Frequency Ordered Stop   06/03/22 1400  piperacillin-tazobactam (ZOSYN) IVPB 3.375 g        3.375 g 12.5 mL/hr over 240 Minutes Intravenous Every 8 hours 06/03/22 0900 06/16/22 0832   06/02/22 0000  cefTRIAXone (ROCEPHIN) 2 g in sodium chloride 0.9 % 100 mL IVPB  Status:  Discontinued        2 g 200 mL/hr over 30 Minutes Intravenous Every 24 hours 06/01/22 1302 06/03/22 0856   06/01/22 1400  metroNIDAZOLE (FLAGYL) IVPB 500 mg  Status:  Discontinued        500 mg 100 mL/hr over 60 Minutes Intravenous Every 12 hours 06/01/22 1302 06/03/22 0856   06/01/22 1200  cefTRIAXone (ROCEPHIN) 2 g in sodium chloride 0.9 % 100 mL IVPB  Status:  Discontinued        2 g 200 mL/hr over 30 Minutes Intravenous Every 24 hours 06/01/22 0432 06/01/22 0801   06/01/22 0900  Ampicillin-Sulbactam (UNASYN) 3 g in sodium chloride 0.9 % 100 mL IVPB  Status:  Discontinued        3  g 200 mL/hr over 30 Minutes Intravenous Every 6 hours 06/01/22 0801 06/01/22 1302   06/01/22 0030  cefTRIAXone (ROCEPHIN) 1 g in sodium chloride 0.9 % 100 mL IVPB        1 g 200 mL/hr over 30 Minutes Intravenous  Once 06/01/22 0027 06/01/22 0155   06/01/22 0030  metroNIDAZOLE (FLAGYL) IVPB 500 mg  Status:  Discontinued        500 mg 100 mL/hr over 60 Minutes Intravenous Every 12 hours 06/01/22 0027 06/01/22 0801        Assessment/Plan Perforated diverticulitis with multiple abscesses -POD#11 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 Barry Dienes - path consistent with diverticulitis  - continue vac MWF. Plan to change again Wednesday, will need to try with oral pain medication. We may be able to remove staples that day.  - Patient is worried about going home  with no support - TOC consult for assistance with possibility of SNF - WOC following for new ostomy  - s/p IR drain 7/6. Culture negative to date. May be able to remove drain prior to discharge - will reach out to IR today   ID - zosyn 6/28>>7/11 FEN - soft diet VTE - lovenox Foley - none   LOS: 15 days    Wellington Hampshire, [facility] Surgery 06/16/2022, 11:15 AM Please see Amion for pager number during day hours 7:00am-4:30pm

## 2022-06-16 NOTE — Progress Notes (Signed)
PROGRESS NOTE  Arayah Krouse Navarra GNF:[id number] DOB: 14-Sep-1971 DOA: 05/31/2022 PCP: [facility]   LOS: 15 days   Brief Narrative / Interim history: 51 year old female with history of prediabetes, obesity, comes to the hospital with abdominal pain.  She was recently seen in the ED couple days prior to admission with sigmoid diverticulitis, she was given Augmentin and sent home, however returned due to worsening pain.  A CT scan showed worsening perforated diverticulitis with abscess formation.  General surgery was consulted and eventually underwent Hartman's procedure on 6/30.  Subjective / 24h Interval events: Doing well today.  No abdominal pain, no nausea or vomiting.  Tolerating diet well  Assesement and Plan: Principal Problem:   Perforation of sigmoid colon due to diverticulitis Active Problems:   Obesity, Class III, BMI 40-49.9 (morbid obesity) (HCC)   Prediabetes   Hyponatremia   Hypokalemia   Bandemia   Normocytic anemia   Principal problem Sigmoid colon perforation due to diverticulitis, multiple abscesses, postop ileus-appreciate general surgery follow-up.  She is status post sigmoid colectomy and end colostomy, takedown of splenic flexure by Dr. Barry Dienes on 6/30.  Postop course complicated by ileus, NG tube, now resolved, NG tube was removed in the interval and she is eating well.  She had persistent WBC elevation and underwent a CT scan of the abdomen and pelvis 7/5 which showed a left-sided fluid collection.  She is status post IR guided drainage on 7/6.  WBC continues to improve, cultures without growth.  Completing Zosyn today -Next midline wound VAC dressing change would be on Wednesday, and potentially surgery will take out some of the staples.  Patient concerned about ability to manage herself at home, and case manager is looking into SNF  Active problems  Hyponatremia-sodium normal this morning   Hypokalemia -potassium 3.6 today   Thrombocytosis -overall  stable,  Normocytic Anemia -no bleeding, monitor  Pre-Diabetes-CBGs with good control  CBG (last 3)  Recent Labs    06/15/22 1634 06/15/22 2338 06/16/22 0731  GLUCAP 123* 119* 119*    Obesity-BMI 40.  She would benefit from weight loss  Scheduled Meds:  acetaminophen  1,000 mg Oral Q6H   enoxaparin (LOVENOX) injection  40 mg Subcutaneous Q24H   feeding supplement  237 mL Oral BID BM   hydrocortisone cream   Topical QID   methocarbamol  500 mg Oral Q6H   sodium chloride flush  5 mL Intracatheter Q8H   Continuous Infusions:  promethazine (PHENERGAN) injection (IM or IVPB)     PRN Meds:.alum & mag hydroxide-simeth, HYDROmorphone (DILAUDID) injection, menthol-cetylpyridinium, ondansetron (ZOFRAN) IV, mouth rinse, oxyCODONE, phenol, promethazine (PHENERGAN) injection (IM or IVPB)  Diet Orders (From admission, onward)     Start     Ordered   06/13/22 0830  DIET SOFT Room service appropriate? Yes; Fluid consistency: Thin  Diet effective now       Question Answer Comment  Room service appropriate? Yes   Fluid consistency: Thin      06/13/22 0830            DVT prophylaxis: enoxaparin (LOVENOX) injection 40 mg Start: 06/03/22 2200 SCDs Start: 06/01/22 0431   Lab Results  Component Value Date   PLT 477 (H) 06/16/2022      Code Status: Full Code  Family Communication: no family at bedside   Status is: Inpatient  Remains inpatient appropriate because: SNF tomorrow once cleared by surgery  Level of care: Progressive  Consultants:  General surgery  Objective: Vitals:  06/15/22 0558 06/15/22 1300 06/15/22 2124 06/16/22 0558  BP: 1'02/71 93/62 95/65 '$ 97/67  Pulse: 79 79 98 69  Resp: '20  17 18  '$ Temp: 98 F (36.7 C) 99.3 F (37.4 C) 99.1 F (37.3 C) 98.9 F (37.2 C)  TempSrc:  Oral Oral Oral  SpO2: 98% 97% 94% 97%  Weight:      Height:        Intake/Output Summary (Last 24 hours) at 06/16/2022 1240 Last data filed at 06/16/2022 2130 Gross per 24 hour   Intake 306.87 ml  Output 360 ml  Net -53.13 ml    Wt Readings from Last 3 Encounters:  06/05/22 106.6 kg  05/29/22 106.6 kg  04/30/21 120.2 kg    Examination:  Constitutional: NAD Eyes: lids and conjunctivae normal, no scleral icterus ENMT: mmm Neck: normal, supple Respiratory: clear to auscultation bilaterally, no wheezing, no crackles. Normal respiratory effort.  Cardiovascular: Regular rate and rhythm, no murmurs / rubs / gallops.  Abdomen: Ostomy in place, brown material.  Midline wound VAC present Skin: no rashes Neurologic: no focal deficits, equal strength  Data Reviewed: I have independently reviewed following labs and imaging studies   CBC Recent Labs  Lab 06/10/22 0559 06/11/22 0432 06/12/22 0436 06/13/22 0450 06/14/22 0400 06/16/22 0425  WBC 20.7* 19.3* 15.1* 13.4* 12.4* 11.6*  HGB 10.1* 9.2* 9.3* 9.2* 8.9* 8.6*  HCT 32.1* 28.2* 29.1* 28.8* 28.6* 27.3*  PLT 359 331 387 443* 473* 477*  MCV 90.4 90.4 90.1 91.1 91.1 90.7  MCH 28.5 29.5 28.8 29.1 28.3 28.6  MCHC 31.5 32.6 32.0 31.9 31.1 31.5  RDW 15.0 15.0 14.8 15.1 15.1 15.0  LYMPHSABS 1.9  --   --   --   --   --   MONOABS 1.3*  --   --   --   --   --   EOSABS 0.3  --   --   --   --   --   BASOSABS 0.1  --   --   --   --   --      Recent Labs  Lab 06/10/22 0559 06/11/22 0432 06/12/22 0436 06/13/22 0450 06/14/22 0400 06/16/22 0425  NA 138 137 139 139 141 139  K 3.8 3.9 3.8 3.7 3.5 3.6  CL 98 101 102 104 104 105  CO2 '31 28 28 28 29 28  '$ GLUCOSE 103* 103* 107* 105* 111* 129*  BUN 7 5* <5* <5* 5* 9  CREATININE 0.65 0.55 0.60 0.52 0.76 0.67  CALCIUM 8.1* 8.1* 8.2* 8.2* 8.1* 8.0*  AST 33 21 15  --   --  17  ALT 35 26 23  --   --  20  ALKPHOS 74 72 76  --   --  87  BILITOT 0.8 1.0 1.1  --   --  0.5  ALBUMIN 2.2* 2.2* 2.1*  --   --  2.2*  MG 2.1  --  2.0  --   --   --      ------------------------------------------------------------------------------------------------------------------ No  results for input(s): "CHOL", "HDL", "LDLCALC", "TRIG", "CHOLHDL", "LDLDIRECT" in the last 72 hours.  Lab Results  Component Value Date   HGBA1C 5.6 06/01/2022   ------------------------------------------------------------------------------------------------------------------ No results for input(s): "TSH", "T4TOTAL", "T3FREE", "THYROIDAB" in the last 72 hours.  Invalid input(s): "FREET3"  Cardiac Enzymes No results for input(s): "CKMB", "TROPONINI", "MYOGLOBIN" in the last 168 hours.  Invalid input(s): "CK" ------------------------------------------------------------------------------------------------------------------ No results found for: "BNP"  CBG: Recent Labs  Lab 06/14/22  2359 06/15/22 0738 06/15/22 1634 06/15/22 2338 06/16/22 0731  GLUCAP 132* 118* 123* 119* 119*     Recent Results (from the past 240 hour(s))  Aerobic/Anaerobic Culture w Gram Stain (surgical/deep wound)     Status: None   Collection Time: 06/11/22  2:14 PM   Specimen: Abscess  Result Value Ref Range Status   Specimen Description   Final    ABSCESS Performed at [facility] [address], [place], [place] [address]    Special Requests   Final    ABDOMEN Performed at [facility], [place] [address], [place], [place] [address]    Gram Stain NO WBC SEEN NO ORGANISMS SEEN   Final   Culture   Final    No growth aerobically or anaerobically. Performed at [facility], [place] [address] [place], [place] [address]    Report Status 06/16/2022 FINAL  Final     Radiology Studies: No results found.   Marzetta Board, MD, PhD [facility]  Between 7 am - 7 pm I am available, please contact me via Amion (for emergencies) or Securechat (non urgent messages)  Between 7 pm - 7 am I am not available, please contact night coverage MD/APP via Amion

## 2022-06-16 NOTE — Progress Notes (Signed)
Physical Therapy Treatment Patient Details Name: Morgan Cline MRN: [id number] DOB: 10-May-1971 Today's Date: 06/16/2022   History of Present Illness 51 year old female with PMH of morbid obesity, prediabetes and migraine headache returning with worsening abdominal pain due to acute sigmoid diverticulitis despite oral antibiotics.  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    PT Comments    Pt is a 51 y.o. female with above HPI resulting in the deficits listed below (see PT Problem List). Pt performed bed mobility with MIN guard and some use of bed features to assist with trunk control, education of log roll technique provided. Pt assisted from recliner to [facility] and back to bed with Min guard-supervision and use of RW. Pt required increased time between all transitional movements with reported 10/10 pain levels and politely deferring additional mobility this session. RN notified and in at end of session to administer pain medication. Pt will benefit from skilled PT to maximize functional mobility to increase independence.     Recommendations for follow up therapy are one component of a multi-disciplinary discharge planning process, led by the attending physician.  Recommendations may be updated based on patient status, additional functional criteria and insurance authorization.  Follow Up Recommendations  No PT follow up     Assistance Recommended at Discharge PRN  Patient can return home with the following A little help with walking and/or transfers;A little help with bathing/dressing/bathroom;Help with stairs or ramp for entrance   Equipment Recommendations  None recommended by PT    Recommendations for Other Services       Precautions / Restrictions Precautions Precautions: Fall Precaution Comments: multiple lines, colostomy,IV, wound VAC Restrictions Weight Bearing Restrictions: No     Mobility  Bed  Mobility Overal bed mobility: Needs Assistance Bed Mobility: Sit to Supine       Sit to supine: Min guard   General bed mobility comments: OOB in chair upon entry, increased time and use of bed rails/features for trunk control. Education on log toll technique for protection of abdominal region with bed mobility. Review of splinting.    Transfers Overall transfer level: Needs assistance Equipment used: Rolling walker (2 wheels) Transfers: Sit to/from Stand, Bed to chair/wheelchair/BSC Sit to Stand: Min guard, Supervision   Step pivot transfers: Supervision       General transfer comment: MIN guard with increased time for problem solving with hand placement and for rise from recliner chair. Supervision to rise from [facility] with use of B UEs for power up. Able to ambulate ~22f forward and [facility]   Ambulation/Gait Ambulation/Gait assistance: Supervision Gait Distance (Feet): 5 Feet Assistive device: Rolling walker (2 wheels) Gait Pattern/deviations: Step-through pattern, Decreased stride length, Trunk flexed Gait velocity: decreased     General Gait Details: slow but functional gait increased complaints of ABD pain with all activity. Politely deferring additional [facility]ulation [facility], assisted to supine following toileting. Reports this has been most pain she has had.   Stairs             Wheelchair Mobility    Modified Rankin (Stroke Patients Only)       Balance Overall balance assessment: Needs assistance Sitting-balance support: Feet supported Sitting balance-Leahy Scale: Fair     Standing balance support: Bilateral upper extremity supported, During functional activity Standing balance-Leahy Scale: Fair Standing balance comment: able to stand statically without UE support  Cognition Arousal/Alertness: Awake/alert Behavior During Therapy: WFL for tasks  assessed/performed Overall Cognitive Status: Within Functional Limits for tasks assessed                                 General Comments: very pleasant and willing to participate, repeatedly apologizing to therapist for not being able to tolerate activity this session due to pain        Exercises      General Comments        Pertinent Vitals/Pain Pain Assessment Pain Assessment: 0-10 Pain Score: 10-Worst pain ever Pain Location: abdomen Pain Descriptors / Indicators: Aching, Grimacing, Tender, Operative site guarding, Sore Pain Intervention(s): Limited activity within patient's tolerance, Monitored during session, Repositioned, Patient requesting pain meds-RN notified    Home Living                          Prior Function            PT Goals (current goals can now be found in the care plan section) Acute Rehab PT Goals Patient Stated Goal: less pain and improve mobility PT Goal Formulation: With patient Time For Goal Achievement: 06/20/22 Potential to Achieve Goals: Good Progress towards PT goals: Progressing toward goals    Frequency    Min 3X/week      PT Plan Current plan remains appropriate    Co-evaluation              AM-PAC PT "6 Clicks" Mobility   Outcome Measure  Help needed turning from your back to your side while in a flat bed without using bedrails?: A Little Help needed moving from lying on your back to sitting on the side of a flat bed without using bedrails?: A Little Help needed moving to and from a bed to a chair (including a wheelchair)?: A Little Help needed standing up from a chair using your arms (e.g., wheelchair or bedside chair)?: A Little Help needed to walk in hospital room?: A Little Help needed climbing 3-5 steps with a railing? : A Lot 6 Click Score: 17    End of Session Equipment Utilized During Treatment: Gait belt Activity Tolerance: Patient limited by pain Patient left: in bed;with call  bell/phone within reach;with family/visitor present;with nursing/sitter in room Nurse Communication: Mobility status PT Visit Diagnosis: Other abnormalities of gait and mobility (R26.89);Pain Pain - Right/Left:  (mid) Pain - part of body:  (abdomen)     Time: [id number] PT Time Calculation (min) (ACUTE ONLY): 29 min  Charges:  $Therapeutic Activity: 23-37 mins                    Festus Barren PT, DPT  Acute Rehabilitation Services  Office [phone]  06/16/2022, 2:16 PM

## 2022-06-16 NOTE — Consult Note (Signed)
[facility] Nurse ostomy follow up Stoma type/location: LUQ, end colostomy  Stomal assessment/size: flush with skin, appears to be retracting  Output green, loose stool  Ostomy pouching: 1pc.flex convex with 2" skin barrier ring, may need belt Up in chair, pouch with good seal today Education provided:  Provided [facility] catalog and marked items she is using. Difficulty finding HHRN to take patient with VAC, may DC to SNF for short term rehab. I have encouraged her to gain independence with ostomy emptying and we will work on pouch change again tomorrow. High output of drainage from wound in VAC canister, however output appears serosanguinous  Discussed ostomy clinic as well and provided information for patient.  Enrolled patient in [place] Start Discharge program: Yes  Will plan NPWT dressing change and ostomy pouch change tomorrow.   [place], [place], [place]

## 2022-06-17 ENCOUNTER — Other Ambulatory Visit (HOSPITAL_COMMUNITY): Payer: Self-pay

## 2022-06-17 DIAGNOSIS — K5720 Diverticulitis of large intestine with perforation and abscess without bleeding: Secondary | ICD-10-CM | POA: Diagnosis not present

## 2022-06-17 LAB — GLUCOSE, CAPILLARY
Glucose-Capillary: 114 mg/dL — ABNORMAL HIGH (ref 70–99)
Glucose-Capillary: 115 mg/dL — ABNORMAL HIGH (ref 70–99)
Glucose-Capillary: 121 mg/dL — ABNORMAL HIGH (ref 70–99)

## 2022-06-17 LAB — CBC
HCT: 28.7 % — ABNORMAL LOW (ref 36.0–46.0)
Hemoglobin: 8.9 g/dL — ABNORMAL LOW (ref 12.0–15.0)
MCH: 28.3 pg (ref 26.0–34.0)
MCHC: 31 g/dL (ref 30.0–36.0)
MCV: 91.1 fL (ref 80.0–100.0)
Platelets: 517 10*3/uL — ABNORMAL HIGH (ref 150–400)
RBC: 3.15 MIL/uL — ABNORMAL LOW (ref 3.87–5.11)
RDW: 15.1 % (ref 11.5–15.5)
WBC: 9.9 10*3/uL (ref 4.0–10.5)
nRBC: 0 % (ref 0.0–0.2)

## 2022-06-17 LAB — BASIC METABOLIC PANEL
Anion gap: 6 (ref 5–15)
BUN: 8 mg/dL (ref 6–20)
CO2: 27 mmol/L (ref 22–32)
Calcium: 8.4 mg/dL — ABNORMAL LOW (ref 8.9–10.3)
Chloride: 109 mmol/L (ref 98–111)
Creatinine, Ser: 0.78 mg/dL (ref 0.44–1.00)
GFR, Estimated: >60 mL/min (ref >60)
Glucose, Bld: 120 mg/dL — ABNORMAL HIGH (ref 70–99)
Potassium: 3.5 mmol/L (ref 3.5–5.1)
Sodium: 142 mmol/L (ref 135–145)

## 2022-06-17 MED ORDER — OXYCODONE HCL 5 MG PO TABS
10.0000 mg | ORAL_TABLET | Freq: Once | ORAL | Status: AC
  Administered 2022-06-17: 10 mg via ORAL
  Filled 2022-06-17: qty 2

## 2022-06-17 MED ORDER — HYDROCORTISONE 0.5 % EX CREA
TOPICAL_CREAM | Freq: Four times a day (QID) | CUTANEOUS | 0 refills | Status: DC
  Filled 2022-06-17: qty 30, fill #0

## 2022-06-17 MED ORDER — OXYCODONE HCL 5 MG PO TABS: 5.0000 mg | ORAL_TABLET | Freq: Four times a day (QID) | ORAL | 0 refills | Status: DC | PRN

## 2022-06-17 MED ORDER — METHOCARBAMOL 500 MG PO TABS
500.0000 mg | ORAL_TABLET | Freq: Four times a day (QID) | ORAL | 0 refills | Status: DC
  Filled 2022-06-17: qty 30, 8d supply, fill #0

## 2022-06-17 MED ORDER — ACETAMINOPHEN 500 MG PO TABS
1000.0000 mg | ORAL_TABLET | Freq: Four times a day (QID) | ORAL | 0 refills | Status: AC
  Filled 2022-06-17: qty 30, 4d supply, fill #0

## 2022-06-17 NOTE — Progress Notes (Addendum)
Referring Physician(s): Cornett,T  Supervising Physician: Daryll Brod  Patient Status:  [facility] - In-pt  Chief Complaint:  Abdominal pain, fluid collection  Subjective: Pt states she is doing "alright." She notes central abdominal pain at her [place] site, but does not note any discomfort at drain insertion site. She notes that she is being discharged to SNF today.   Allergies: Codeine, Compazine [prochlorperazine], and Pineapple  Medications: Prior to Admission medications   Medication Sig Start Date End Date Taking? Authorizing Provider  amoxicillin-clavulanate (AUGMENTIN) 875-125 MG tablet Take 1 tablet by mouth every 12 (twelve) hours. 05/29/22  Yes Jeanell Sparrow, DO  Aspirin-Salicylamide-Caffeine (BC HEADACHE POWDER PO) Take 1 packet by mouth daily as needed (headaches).   Yes [provider]  diphenhydramine-acetaminophen (TYLENOL PM) 25-500 MG TABS tablet Take 1 tablet by mouth at bedtime as needed (sleep).   Yes [provider]  Fiber Select Gummies CHEW Chew 3 tablets by mouth at bedtime.   Yes [provider]  HYDROcodone-acetaminophen (NORCO) 5-325 MG tablet Take 1 tablet by mouth every 6 (six) hours as needed for moderate pain. 05/29/22  Yes Wynona Dove A, DO  ibuprofen (ADVIL) 600 MG tablet Take 1 tablet (600 mg total) by mouth every 6 (six) hours as needed. Patient taking differently: Take 600 mg by mouth every 6 (six) hours as needed (pain). 05/29/22  Yes Jeanell Sparrow, DO  levonorgestrel (MIRENA) 20 MCG/24HR IUD 1 each by Intrauterine route once. Implanted September 2020   Yes [provider]  ondansetron (ZOFRAN) 4 MG tablet Take 1 tablet (4 mg total) by mouth every 4 (four) hours as needed for nausea or vomiting. 05/29/22  Yes Jeanell Sparrow, DO  Semaglutide, 1 MG/DOSE, (OZEMPIC, 1 MG/DOSE,) 4 MG/3ML SOPN Inject 1 mg into the skin every Sunday.   Yes [provider]     Vital Signs: BP 98/72 (BP Location: Left Arm)    Pulse 94   Temp 98.2 F (36.8 C) (Oral)   Resp 18   Ht '5\' 4"'$  (1.626 m)   Wt 235 lb (106.6 kg)   LMP 06/09/2022   SpO2 96%   BMI 40.34 kg/m   Physical Exam Vitals reviewed.  Constitutional:      General: She is not in acute distress. Cardiovascular:     Pulses: Normal pulses.  Pulmonary:     Effort: Pulmonary effort is normal.  Skin:    General: Skin is warm and dry.     Comments: Drain insertion site non-erythematous, suture in place, dressed appropriately  Neurological:     Mental Status: She is alert and oriented to person, place, and time.  Psychiatric:        Mood and Affect: Mood normal.        Behavior: Behavior normal.     Drain Location: LUQ Size: Fr size: 10 Fr Date of placement: 06/11/22  Currently to: Drain collection device: suction bulb 24 hour output:  Output by Drain (mL) 06/15/22 0701 - 06/15/22 1900 06/15/22 1901 - 06/16/22 0700 06/16/22 0701 - 06/16/22 1900 06/16/22 1901 - 06/17/22 0700 06/17/22 0701 - 06/17/22 0948  Closed System Drain 2 Lateral;Superior LUQ Bulb (JP) 10 Fr. 10 0 55 10       Current examination: Scant serosanguinous fluid in JP bulb today Flushes with some difficulty, does not aspirate Insertion site unremarkable. Suture and in place. Dressed appropriately. Dressing clean, dry, intact     Imaging: CT ABDOMEN PELVIS W CONTRAST  Result Date: 06/16/2022  CLINICAL DATA:  s/p LUQ fluid collection status post drain insertion on 06/11/2022, follow-up study EXAM: CT ABDOMEN AND PELVIS WITH CONTRAST TECHNIQUE: Multidetector CT imaging of the abdomen and pelvis was performed using the standard protocol following bolus administration of intravenous contrast. RADIATION DOSE REDUCTION: This exam was performed according to the departmental dose-optimization program which includes automated exposure control, adjustment of the mA and/or kV according to patient size and/or use of iterative reconstruction technique. CONTRAST:  142m OMNIPAQUE  IOHEXOL 300 MG/ML  SOLN COMPARISON:  June 10, 2022 FINDINGS: Lower chest: There is consolidation at the left lung base with some air bronchograms and has increased in the interim. Small left pleural effusion seen. Hepatobiliary: No focal liver abnormality is seen. No gallstones, gallbladder wall thickening, or biliary dilatation. Pancreas: Unremarkable. No pancreatic ductal dilatation or surrounding inflammatory changes. Spleen: Normal in size without focal abnormality. Adrenals/Urinary Tract: Adrenal glands are unremarkable. Kidneys are normal, without renal calculi, focal lesion, or hydronephrosis. Bladder is unremarkable. Stomach/Bowel: Again seen are the postsurgical distal colectomy with diverting colostomy left lower quadrant changes. No bowel obstruction. There has been interval removal of the enteric tube. There is some contrast seen in the distal sigmoid and rectum. Vascular/Lymphatic: No significant vascular findings are present. No enlarged abdominal or pelvic lymph nodes. Reproductive: Stable uterine fibroid and IUD. Other: There are multiple loculated fluid pockets. There has been interval drain insertion into the fluid collection at the left paracolic gutter. There is residual fluid collection seen along the left paracolic gutter measuring approximally 3.2 cm transverse by 10.3 cm craniocaudad. There is loculated fluid collection seen at the right flank measuring 5.3 x 2.3 x 5.1 cm in comparison to 6.5 x 2.2 x 7.2 cm on the previous study. There is some loculated fluid collection seen at the posterior mesentery in the lower abdomen-upper pelvis measuring approximately 4.7 cm transverse x 1.3 cm AP x 4.5 cm craniocaudad without significant interval change. There is some fluid around the fundus of the uterus and has decreased in the interim measuring approximally 1 cm in the AP dimension in comparison to 2.2 cm on the previous study. There is a small fluid collection in the subcutaneous soft tissues of  the left anterior abdominal wall just medial to the ostomy tract without significant interval change. Moderate inflammatory stranding at the left mid to lower anterior abdominal wall. Musculoskeletal: No acute or significant osseous findings. IMPRESSION: 1. Stable distal colectomy with a diverting left lower quadrant colostomy changes. 2. There has been interval drain insertion into the fluid collection along the left paracolic gutter. There is residual fluid collection seen at the catheter insertion site measuring approximately 3.2 cm transverse by 10.3 cm craniocaudad. In addition, there are small multiple loculated fluid pockets seen and have decreased in the interim. The fluid pocket at the right upper flank measures 5.3 x 2.3 x 5.1 cm in comparison to 6.5 x 2.2 x 7.2 cm on the previous study. Small loculated fluid collection at the posterior mesentery in the lower abdomen-upper pelvis is without significant interval change. Fluid collection along the fundus of the uterus has decreased in the interim. 3. There is consolidation at the left lung base with some air bronchograms and has increased in the interim. Small left pleural effusion. Electronically Signed   By: AFrazier RichardsM.D.   On: 06/16/2022 13:28    Labs:  CBC: Recent Labs    06/13/22 0450 06/14/22 0400 06/16/22 0425 06/17/22 0433  WBC 13.4* 12.4* 11.6* 9.9  HGB  9.2* 8.9* 8.6* 8.9*  HCT 28.8* 28.6* 27.3* 28.7*  PLT 443* 473* 477* 517*     COAGS: No results for input(s): "INR", "APTT" in the last 8760 hours.  BMP: Recent Labs    06/13/22 0450 06/14/22 0400 06/16/22 0425 06/17/22 0433  NA 139 141 139 142  K 3.7 3.5 3.6 3.5  CL 104 104 105 109  CO2 '28 29 28 27  '$ GLUCOSE 105* 111* 129* 120*  BUN <5* 5* 9 8  CALCIUM 8.2* 8.1* 8.0* 8.4*  CREATININE 0.52 0.76 0.67 0.78  GFRNONAA >60 >60 >60 >60     LIVER FUNCTION TESTS: Recent Labs    06/10/22 0559 06/11/22 0432 06/12/22 0436 06/16/22 0425  BILITOT 0.8 1.0 1.1 0.5   AST 33 '21 15 17  '$ ALT 35 '26 23 20  '$ ALKPHOS 74 72 76 87  PROT 5.8* 5.5* 5.7* 6.0*  ALBUMIN 2.2* 2.2* 2.1* 2.2*     Assessment and Plan: Pt with hx perf diverticulitis with mult abscesses, s/p sigmoid colectomy with end colostomy, takedown of splenic flexure 6/30; s/p drainage of LUQ fluid collection 7/6 (10 fr to JP); afebrile; WBC 9.9, hgb 8.9, creat 0.78; drain fl cx neg;   Interval imaging/drain manipulation:  CT performed 7/11 1. Stable distal colectomy with a diverting left lower quadrant colostomy changes.  2. There has been interval drain insertion into the fluid collection along the left paracolic gutter. There is residual fluid collection seen at the catheter insertion site measuring approximately 3.2 cm transverse by 10.3 cm craniocaudad.  In addition, there are small multiple loculated fluid pockets seen and have decreased in the interim. The fluid pocket at the right upper flank measures 5.3 x 2.3 x 5.1 cm in comparison to 6.5 x 2.2 x 7.2 cm on the previous study. Small loculated fluid collection at the posterior mesentery in the lower abdomen-upper pelvis is without significant interval change. Fluid collection along the fundus of the uterus has decreased in the interim.  3. There is consolidation at the left lung base with some air bronchograms and has increased in the interim. Small left pleural effusion.  Above d/w CCS; drain fluid appears serous, cx neg, output minimal and may well be ascitic; discussed with surgical team and they request drain to remain in place when discharged to SNF  Plan:  Patient to be discharged to SNF today. Will plan to have SNF flush drain once daily with 26m NS,  dressing changes every 2-3 days or earlier if soiled. . Continue to monitor and record output daily. Order for IR follow-up in clinic in 7-10 days placed. IR scheduler will contact patient with date/time of appointment. Please call with any questions or concerns.   Electronically  Signed: MLura Em PA-C 06/17/2022, 9:44 AM   I spent a total of 15 Minutes at the the patient's bedside AND on the patient's hospital floor or unit, greater than 50% of which was counseling/coordinating care for left upper abdominal fluid collection drain    Patient ID: Morgan Cline, female   DOB: 903/05/1971 51y.o.   MRN: [id number]

## 2022-06-17 NOTE — Progress Notes (Signed)
Patient ID: Morgan Cline, female   DOB: [birth year], 51 y.o.   MRN: [id number] [facility] Surgery Progress Note  12 Days Post-Op  Subjective: CC-  Continued pain with vac changes, otherwise doing well. No n/v. Tolerating diet and colostomy working. WBC normalized  Objective: Vital signs in last 24 hours: Temp:  [98.2 F (36.8 C)-98.7 F (37.1 C)] 98.2 F (36.8 C) (07/12 0848) Pulse Rate:  [76-97] 94 (07/12 0848) Resp:  [16-20] 18 (07/12 0848) BP: (98-113)/(58-72) 98/72 (07/12 0848) SpO2:  [96 %-99 %] 96 % (07/12 0848) Last BM Date : 06/16/22  Intake/Output from previous day: 07/11 0701 - 07/12 0700 In: 5  Out: 240 [Drains:65; Stool:175] Intake/Output this shift: No intake/output data recorded.  PE: General appearance: alert and cooperative GI: soft, appropriately tender, ostomy pictured below with some mucocutaneous separation from 3-11 o'clock. Open midline wound with healthy granulation tissue, pictured below. IR drain with serous fluid in bulb      Lab Results:  Recent Labs    06/16/22 0425 06/17/22 0433  WBC 11.6* 9.9  HGB 8.6* 8.9*  HCT 27.3* 28.7*  PLT 477* 517*   BMET Recent Labs    06/16/22 0425 06/17/22 0433  NA 139 142  K 3.6 3.5  CL 105 109  CO2 28 27  GLUCOSE 129* 120*  BUN 9 8  CREATININE 0.67 0.78  CALCIUM 8.0* 8.4*   PT/INR No results for input(s): "LABPROT", "INR" in the last 72 hours. CMP     Component Value Date/Time   NA 142 06/17/2022 0433   K 3.5 06/17/2022 0433   CL 109 06/17/2022 0433   CO2 27 06/17/2022 0433   GLUCOSE 120 (H) 06/17/2022 0433   BUN 8 06/17/2022 0433   CREATININE 0.78 06/17/2022 0433   CALCIUM 8.4 (L) 06/17/2022 0433   PROT 6.0 (L) 06/16/2022 0425   ALBUMIN 2.2 (L) 06/16/2022 0425   AST 17 06/16/2022 0425   ALT 20 06/16/2022 0425   ALKPHOS 87 06/16/2022 0425   BILITOT 0.5 06/16/2022 0425   GFRNONAA >60 06/17/2022 0433   GFRAA >90 11/16/2011 1455   Lipase     Component Value Date/Time    LIPASE 23 05/31/2022 2224       Studies/Results: CT ABDOMEN PELVIS W CONTRAST  Result Date: 06/16/2022 CLINICAL DATA:  s/p LUQ fluid collection status post drain insertion on 06/11/2022, follow-up study EXAM: CT ABDOMEN AND PELVIS WITH CONTRAST TECHNIQUE: Multidetector CT imaging of the abdomen and pelvis was performed using the standard protocol following bolus administration of intravenous contrast. RADIATION DOSE REDUCTION: This exam was performed according to the departmental dose-optimization program which includes automated exposure control, adjustment of the mA and/or kV according to patient size and/or use of iterative reconstruction technique. CONTRAST:  128m OMNIPAQUE IOHEXOL 300 MG/ML  SOLN COMPARISON:  June 10, 2022 FINDINGS: Lower chest: There is consolidation at the left lung base with some air bronchograms and has increased in the interim. Small left pleural effusion seen. Hepatobiliary: No focal liver abnormality is seen. No gallstones, gallbladder wall thickening, or biliary dilatation. Pancreas: Unremarkable. No pancreatic ductal dilatation or surrounding inflammatory changes. Spleen: Normal in size without focal abnormality. Adrenals/Urinary Tract: Adrenal glands are unremarkable. Kidneys are normal, without renal calculi, focal lesion, or hydronephrosis. Bladder is unremarkable. Stomach/Bowel: Again seen are the postsurgical distal colectomy with diverting colostomy left lower quadrant changes. No bowel obstruction. There has been interval removal of the enteric tube. There is some contrast seen in the distal sigmoid and rectum.  Vascular/Lymphatic: No significant vascular findings are present. No enlarged abdominal or pelvic lymph nodes. Reproductive: Stable uterine fibroid and IUD. Other: There are multiple loculated fluid pockets. There has been interval drain insertion into the fluid collection at the left paracolic gutter. There is residual fluid collection seen along the left  paracolic gutter measuring approximally 3.2 cm transverse by 10.3 cm craniocaudad. There is loculated fluid collection seen at the right flank measuring 5.3 x 2.3 x 5.1 cm in comparison to 6.5 x 2.2 x 7.2 cm on the previous study. There is some loculated fluid collection seen at the posterior mesentery in the lower abdomen-upper pelvis measuring approximately 4.7 cm transverse x 1.3 cm AP x 4.5 cm craniocaudad without significant interval change. There is some fluid around the fundus of the uterus and has decreased in the interim measuring approximally 1 cm in the AP dimension in comparison to 2.2 cm on the previous study. There is a small fluid collection in the subcutaneous soft tissues of the left anterior abdominal wall just medial to the ostomy tract without significant interval change. Moderate inflammatory stranding at the left mid to lower anterior abdominal wall. Musculoskeletal: No acute or significant osseous findings. IMPRESSION: 1. Stable distal colectomy with a diverting left lower quadrant colostomy changes. 2. There has been interval drain insertion into the fluid collection along the left paracolic gutter. There is residual fluid collection seen at the catheter insertion site measuring approximately 3.2 cm transverse by 10.3 cm craniocaudad. In addition, there are small multiple loculated fluid pockets seen and have decreased in the interim. The fluid pocket at the right upper flank measures 5.3 x 2.3 x 5.1 cm in comparison to 6.5 x 2.2 x 7.2 cm on the previous study. Small loculated fluid collection at the posterior mesentery in the lower abdomen-upper pelvis is without significant interval change. Fluid collection along the fundus of the uterus has decreased in the interim. 3. There is consolidation at the left lung base with some air bronchograms and has increased in the interim. Small left pleural effusion. Electronically Signed   By: Frazier Richards M.D.   On: 06/16/2022 13:28     Anti-infectives: Anti-infectives (From admission, onward)    Start     Dose/Rate Route Frequency Ordered Stop   06/03/22 1400  piperacillin-tazobactam (ZOSYN) IVPB 3.375 g        3.375 g 12.5 mL/hr over 240 Minutes Intravenous Every 8 hours 06/03/22 0900 06/16/22 1356   06/02/22 0000  cefTRIAXone (ROCEPHIN) 2 g in sodium chloride 0.9 % 100 mL IVPB  Status:  Discontinued        2 g 200 mL/hr over 30 Minutes Intravenous Every 24 hours 06/01/22 1302 06/03/22 0856   06/01/22 1400  metroNIDAZOLE (FLAGYL) IVPB 500 mg  Status:  Discontinued        500 mg 100 mL/hr over 60 Minutes Intravenous Every 12 hours 06/01/22 1302 06/03/22 0856   06/01/22 1200  cefTRIAXone (ROCEPHIN) 2 g in sodium chloride 0.9 % 100 mL IVPB  Status:  Discontinued        2 g 200 mL/hr over 30 Minutes Intravenous Every 24 hours 06/01/22 0432 06/01/22 0801   06/01/22 0900  Ampicillin-Sulbactam (UNASYN) 3 g in sodium chloride 0.9 % 100 mL IVPB  Status:  Discontinued        3 g 200 mL/hr over 30 Minutes Intravenous Every 6 hours 06/01/22 0801 06/01/22 1302   06/01/22 0030  cefTRIAXone (ROCEPHIN) 1 g in sodium chloride 0.9 % 100  mL IVPB        1 g 200 mL/hr over 30 Minutes Intravenous  Once 06/01/22 0027 06/01/22 0155   06/01/22 0030  metroNIDAZOLE (FLAGYL) IVPB 500 mg  Status:  Discontinued        500 mg 100 mL/hr over 60 Minutes Intravenous Every 12 hours 06/01/22 0027 06/01/22 0801        Assessment/Plan Perforated diverticulitis with multiple abscesses -POD#12 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 Barry Dienes - path consistent with diverticulitis  - abdominal staples removed today. continue vac MWF. Tolerated well with PO pain medication today - tolerating diet and ostomy functioning. Mucocutaneous separation noted today, will monitor - WOC following for new ostomy. Outpatient referral to ostomy clinic placed - s/p IR drain 7/6. Culture negative to date.  CT yesterday showed persistent fluid collection with drain in place. Continue drain for now, will need OP IR follow up - ok for d/c to SNF today from surgical standpoint. Discharge instructions and follow up into on AVS   ID - zosyn 6/28>>7/11 FEN - soft diet VTE - lovenox Foley - none    LOS: 16 days    Wellington Hampshire, [facility] 06/17/2022, 10:34 AM Please see Amion for pager number during day hours 7:00am-4:30pm

## 2022-06-17 NOTE — Discharge Instructions (Addendum)
[facility], [place] [phone]  OPEN ABDOMINAL SURGERY: POST OP INSTRUCTIONS  Always review your discharge instruction sheet given to you by the facility where your surgery was performed.  IF YOU HAVE DISABILITY OR FAMILY LEAVE FORMS, YOU MUST BRING THEM TO THE OFFICE FOR PROCESSING.  PLEASE DO NOT GIVE THEM TO YOUR DOCTOR.  A prescription for pain medication may be given to you upon discharge.  Take your pain medication as prescribed, if needed.  If narcotic pain medicine is not needed, then you may take acetaminophen (Tylenol) or ibuprofen (Advil) as needed. Take your usually prescribed medications unless otherwise directed. If you need a refill on your pain medication, please contact your pharmacy. They will contact our office to request authorization.  Prescriptions will not be filled after 5pm or on week-ends. You should follow a light diet the first few days after arrival home, such as soup and crackers, pudding, etc.unless your doctor has advised otherwise. A high-fiber, low fat diet can be resumed as tolerated.   Be sure to include lots of fluids daily. Most patients will experience some swelling and bruising on the chest and neck area.  Ice packs will help.  Swelling and bruising can take several days to resolve Most patients will experience some swelling and bruising in the area of the incision. Ice pack will help. Swelling and bruising can take several days to resolve..  It is common to experience some constipation if taking pain medication after surgery.  Increasing fluid intake and taking a stool softener will usually help or prevent this problem from occurring.  A mild laxative (Milk of Magnesia or Miralax) should be taken according to package directions if there are no bowel movements after 48 hours.  ACTIVITIES:  You may resume regular (light) daily activities beginning the next day--such as daily self-care, walking, climbing stairs--gradually increasing activities  as tolerated.  You may have sexual intercourse when it is comfortable.  Refrain from any heavy lifting or straining until approved by your doctor. You may drive when you no longer are taking prescription pain medication, you can comfortably wear a seatbelt, and you can safely maneuver your car and apply brakes You should see your doctor in the office for a follow-up appointment approximately two weeks after your surgery.  Make sure that you call for this appointment within a day or two after you arrive home to insure a convenient appointment time.   WHEN TO CALL YOUR DOCTOR: Fever over 101.0 Inability to urinate Nausea and/or vomiting Extreme swelling or bruising Continued bleeding from incision. Increased pain, redness, or drainage from the incision. Difficulty swallowing or breathing Muscle cramping or spasms. Numbness or tingling in hands or feet or around lips.  The clinic staff is available to answer your questions during regular business hours.  Please don't hesitate to call and ask to speak to one of the nurses if you have concerns.  For further questions, please visit [email or web address]   Flush abdominal drain with 5 cc sterile saline once daily, record output and change dressing every 2-3 days; call [phone] with any drain related questions

## 2022-06-17 NOTE — Consult Note (Addendum)
[facility] Nurse ostomy follow up Stoma type/location: colostomy/ LLQ Stomal assessment/size: retracted with mucocutaneous separation from 3 o'clock to 11 o'clock/ 1 1/4 inch height and 1 1/2 inch width Peristomal assessment: intact Treatment options for stomal/peristomal skin: barrier ring Output: 150 brown Ostomy pouching: 1pc flexible convex with 2" skin barrier ring Education provided:  Explained stoma characteristics (budded, flush, color, texture, care). Pt voiced concern of some blood on skin barrier when she removed. Explained to pt that the stoma is below skin level with mucocutaneous separation. Demonstrated pouch change (cutting new skin barrier, measuring stoma, cleaning peristomal skin and stoma, use of barrier ring). Pt performed pouch change with minimal assistance (cut new skin barrier, cleaned peristomal skin, closed with lock and roll closure, attempted to empty old pouch (needs assistance with this).  Viewing window was cut away to allow for patient to see for placement however she will need assistance with this due to poor visualization.  Demonstrated use of wick to clean spout   Enrolled patient in [employer] Discharge program: Yes, previously enrolled  [place] Nurse wound follow up Wound type: s/p sigmoid colectomy midline incision Measurement: 20cm x6cm x6cm Wound bed: red, granulating; slough at the distal base CCS PA at bedside to remove staples between the proximal and distal wounds, while removing staples WOC nurse provided gentle tension which opened wound and allowed for drainage of serous fluid.  Now patient has one wound, PA verified fascia intact. Discussed with patient rationale for opening wound bed.  Drainage (amount, consistency, odor): serosanguineous Periwound: intact Dressing procedure/placement/frequency: 5 pieces of black foam removed. Saline moist kerlix (1)applied in wound bed. Covered with ABD pads and secured with hypafix tape.  Student WOC- [place], RN, [facility]  [employer] MSN,RN,CWOCN, CNS, CWON-AP [phone]      OSTOMY SUPPLIES: [employer] # [id number] flex convex one piece [employer] # [id number] 2" skin barrier ring  Sending releaser spray with patient for use with removal of NPWT dressing.

## 2022-06-17 NOTE — Discharge Summary (Incomplete)
Physician Discharge Summary  Kylieann Eagles [place] TFT:[id number] DOB: [birth year] DOA: 05/31/2022  PCP: [facility]  Admit date: 05/31/2022 Discharge date: 06/17/2022  Admitted From: snf Disposition:  snf Recommendations for Outpatient Follow-up:  Follow up with PCP in 1-2 weeks Please obtain BMP/CBC in one week Please follow up with Dr. Barry Dienes Home Health: None Equipment/Devices: None Discharge Condition: Stable CODE STATUS: Full code Diet recommendation: Cardiac Brief/Interim Summary:  51 year old female with history of prediabetes, obesity, comes to the hospital with abdominal pain.  She was recently seen in the ED couple days prior to admission with sigmoid diverticulitis, she was given Augmentin and sent home, however returned due to worsening pain.  A CT scan showed worsening perforated diverticulitis with abscess formation.  General surgery was consulted and eventually underwent Hartman's procedure on 6/30. Discharge Diagnoses:  Principal Problem:   Perforation of sigmoid colon due to diverticulitis Active Problems:   Obesity, Class III, BMI 40-49.9 (morbid obesity) (HCC)   Prediabetes   Hyponatremia   Hypokalemia   Bandemia   Normocytic anemia    Sigmoid colon perforation due to diverticulitis, multiple abscesses, postop ileus-appreciate general surgery follow-up.  She is status post sigmoid colectomy and end colostomy, takedown of splenic flexure by Dr. Barry Dienes on 6/30.  Postop course complicated by ileus, NG tube, now resolved, NG tube was removed in the interval and she is eating well.  She had persistent WBC elevation and underwent a CT scan of the abdomen and pelvis 7/5 which showed a left-sided fluid collection.  She is status post IR guided drainage on 7/6.  WBC continues to improve, WBC is normalized on the day of discharge to 9.9.   Cultures without growth.  Completed  Zosyn 06/16/2022 -Patient concerned about ability to manage herself at home, and case manager is  looking into [facility]    Active problems  Hyponatremia-resolved sodium 142 on discharge Hypokalemia -resolved potassium 3.5 on discharge    Thrombocytosis -likely reactive follow-up weekly   normocytic Anemia -no bleeding, monitor weekly hemoglobin 8.9 on discharge   Diabetes-CBGs with good control continue Ozempic   CBG (last 3)  Recent Labs (last 2 labs)        Recent Labs    06/15/22 1634 06/15/22 2338 06/16/22 0731  GLUCAP 123* 119* 119*       Obesity-BMI 40.  She would benefit from weight loss Continue Ozempic    Estimated body mass index is 40.34 kg/m as calculated from the following:   Height as of this encounter: '5\' 4"'$  (1.626 m).   Weight as of this encounter: 106.6 kg.  Discharge Instructions  Discharge Instructions     Diet - low sodium heart healthy   Complete by: As directed    Discharge wound care:   Complete by: As directed    Wound vac 125 mmhg continuous suction   Increase activity slowly   Complete by: As directed       Allergies as of 06/17/2022       Reactions   Codeine Hives, Itching   Compazine [prochlorperazine] Hives, Itching   Pineapple Itching, Swelling   Lip swelling and itching        Medication List     STOP taking these medications    amoxicillin-clavulanate 875-125 MG tablet Commonly known as: AUGMENTIN   BC HEADACHE POWDER PO   HYDROcodone-acetaminophen 5-325 MG tablet Commonly known as: Norco       TAKE these medications    acetaminophen 500 MG tablet Commonly known  as: TYLENOL Take 2 tablets (1,000 mg total) by mouth every 6 (six) hours.   diphenhydramine-acetaminophen 25-500 MG Tabs tablet Commonly known as: TYLENOL PM Take 1 tablet by mouth at bedtime as needed (sleep).   Fiber Select Gummies Chew Chew 3 tablets by mouth at bedtime.   hydrocortisone cream 0.5 % Apply topically 4 (four) times daily.   ibuprofen 600 MG tablet Commonly known as: ADVIL Take 1 tablet (600 mg total) by mouth  every 6 (six) hours as needed. What changed: reasons to take this   levonorgestrel 20 MCG/24HR IUD Commonly known as: MIRENA 1 each by Intrauterine route once. Implanted September 2020   methocarbamol 500 MG tablet Commonly known as: ROBAXIN Take 1 tablet (500 mg total) by mouth every 6 (six) hours.   ondansetron 4 MG tablet Commonly known as: ZOFRAN Take 1 tablet (4 mg total) by mouth every 4 (four) hours as needed for nausea or vomiting.   oxyCODONE 5 MG immediate release tablet Commonly known as: Oxy IR/ROXICODONE Take 1-2 tablets (5-10 mg total) by mouth every 6 (six) hours as needed for moderate pain, severe pain or breakthrough pain.   Ozempic (1 MG/DOSE) 4 MG/3ML Sopn Generic drug: Semaglutide (1 MG/DOSE) Inject 1 mg into the skin every 'Sunday.               Durable Medical Equipment  (From admission, onward)           Start     Ordered   06/15/22 1113  For home use only DME Vac  Once        07'$ /10/23 1113              Discharge Care Instructions  (From admission, onward)           Start     Ordered   06/17/22 0000  Discharge wound care:       Comments: Wound vac 125 mmhg continuous suction   06/17/22 0254            Follow-up Information     Stark Klein, MD. Go on 06/29/2022.   Specialty: General Surgery Why: Your appointment is 06/29/22 at 2:45pm, Arrive 13mn early to check in, fill out paperwork, Bring photo ID and insurance information Contact information: [id number] [facility].   Contact information: [phone]               Allergies  Allergen Reactions   Codeine Hives and Itching   Compazine [Prochlorperazine] Hives and Itching   Pineapple Itching and Swelling    Lip swelling and itching    Consultations: Interventional radiology, general surgery   Procedures/Studies: CT  ABDOMEN PELVIS W CONTRAST  Result Date: 06/16/2022 CLINICAL DATA:  s/p LUQ fluid collection status post drain insertion on 06/11/2022, follow-up study EXAM: CT ABDOMEN AND PELVIS WITH CONTRAST TECHNIQUE: Multidetector CT imaging of the abdomen and pelvis was performed using the standard protocol following bolus administration of intravenous contrast. RADIATION DOSE REDUCTION: This exam was performed according to the departmental dose-optimization program which includes automated exposure control, adjustment of the mA and/or kV according to patient size and/or use of iterative reconstruction technique. CONTRAST:  1078mOMNIPAQUE IOHEXOL 300 MG/ML  SOLN COMPARISON:  June 10, 2022 FINDINGS: Lower chest: There is consolidation at the left lung base with some air bronchograms and has increased in  the interim. Small left pleural effusion seen. Hepatobiliary: No focal liver abnormality is seen. No gallstones, gallbladder wall thickening, or biliary dilatation. Pancreas: Unremarkable. No pancreatic ductal dilatation or surrounding inflammatory changes. Spleen: Normal in size without focal abnormality. Adrenals/Urinary Tract: Adrenal glands are unremarkable. Kidneys are normal, without renal calculi, focal lesion, or hydronephrosis. Bladder is unremarkable. Stomach/Bowel: Again seen are the postsurgical distal colectomy with diverting colostomy left lower quadrant changes. No bowel obstruction. There has been interval removal of the enteric tube. There is some contrast seen in the distal sigmoid and rectum. Vascular/Lymphatic: No significant vascular findings are present. No enlarged abdominal or pelvic lymph nodes. Reproductive: Stable uterine fibroid and IUD. Other: There are multiple loculated fluid pockets. There has been interval drain insertion into the fluid collection at the left paracolic gutter. There is residual fluid collection seen along the left paracolic gutter measuring approximally 3.2 cm transverse by 10.3  cm craniocaudad. There is loculated fluid collection seen at the right flank measuring 5.3 x 2.3 x 5.1 cm in comparison to 6.5 x 2.2 x 7.2 cm on the previous study. There is some loculated fluid collection seen at the posterior mesentery in the lower abdomen-upper pelvis measuring approximately 4.7 cm transverse x 1.3 cm AP x 4.5 cm craniocaudad without significant interval change. There is some fluid around the fundus of the uterus and has decreased in the interim measuring approximally 1 cm in the AP dimension in comparison to 2.2 cm on the previous study. There is a small fluid collection in the subcutaneous soft tissues of the left anterior abdominal wall just medial to the ostomy tract without significant interval change. Moderate inflammatory stranding at the left mid to lower anterior abdominal wall. Musculoskeletal: No acute or significant osseous findings. IMPRESSION: 1. Stable distal colectomy with a diverting left lower quadrant colostomy changes. 2. There has been interval drain insertion into the fluid collection along the left paracolic gutter. There is residual fluid collection seen at the catheter insertion site measuring approximately 3.2 cm transverse by 10.3 cm craniocaudad. In addition, there are small multiple loculated fluid pockets seen and have decreased in the interim. The fluid pocket at the right upper flank measures 5.3 x 2.3 x 5.1 cm in comparison to 6.5 x 2.2 x 7.2 cm on the previous study. Small loculated fluid collection at the posterior mesentery in the lower abdomen-upper pelvis is without significant interval change. Fluid collection along the fundus of the uterus has decreased in the interim. 3. There is consolidation at the left lung base with some air bronchograms and has increased in the interim. Small left pleural effusion. Electronically Signed   By: Frazier Richards M.D.   On: 06/16/2022 13:28   CT IMAGE GUIDED DRAINAGE BY PERCUTANEOUS CATHETER  Result Date:  06/11/2022 INDICATION: 51 year old female with history of left lower quadrant intra-abdominal fluid collection status post diverting colostomy approximately 1 week ago. EXAM: CT IMAGE GUIDED DRAINAGE BY PERCUTANEOUS CATHETER COMPARISON:  06/10/2022 MEDICATIONS: The patient is currently admitted to the hospital and receiving intravenous antibiotics. The antibiotics were administered within an appropriate time frame prior to the initiation of the procedure. ANESTHESIA/SEDATION: Moderate (conscious) sedation was employed during this procedure. A total of Versed 2 mg and Fentanyl 100 mcg was administered intravenously. Moderate Sedation Time: 11 minutes. The patient's level of consciousness and vital signs were monitored continuously by radiology nursing throughout the procedure under my direct supervision. CONTRAST:  None COMPLICATIONS: None immediate. PROCEDURE: RADIATION DOSE REDUCTION: This exam was performed according to  the departmental dose-optimization program which includes automated exposure control, adjustment of the mA and/or kV according to patient size and/or use of iterative reconstruction technique. Informed written consent was obtained from the patient after a discussion of the risks, benefits and alternatives to treatment. The patient was placed supine on the CT gantry and a pre procedural CT was performed re-demonstrating the known abscess/fluid collection within the left lateral abdomen, pericolic region. The procedure was planned. A timeout was performed prior to the initiation of the procedure. The left upper quadrant was prepped and draped in the usual sterile fashion. The overlying soft tissues were anesthetized with 1% lidocaine with epinephrine. Appropriate trajectory was planned with the use of a 22 gauge spinal needle. An 18 gauge trocar needle was advanced into the abscess/fluid collection and a short Amplatz super stiff wire was coiled within the collection. Appropriate positioning was  confirmed with a limited CT scan. The tract was serially dilated allowing placement of a 10 [place] all-purpose drainage catheter. Appropriate positioning was confirmed with a limited postprocedural CT scan. Approximately 75 ml of serosanguineous fluid was aspirated. The tube was connected to a bulb suction and sutured in place. A dressing was placed. The patient tolerated the procedure well without immediate post procedural complication. IMPRESSION: Successful CT guided placement of a 10.2 [place] all purpose drain catheter into the left upper quadrant intra-abdominal fluid collection with aspiration of approximately 75 mL of serosanguineous fluid. Samples were sent to the laboratory as requested by the ordering clinical team. Ruthann Cancer, MD [facility] Electronically Signed   By: Ruthann Cancer M.D.   On: 06/11/2022 16:00   CT ABDOMEN PELVIS W CONTRAST  Result Date: 06/10/2022 CLINICAL DATA:  Abdominal pain, postop day 6, suspect abscess, history of diverticulitis EXAM: CT ABDOMEN AND PELVIS WITH CONTRAST TECHNIQUE: Multidetector CT imaging of the abdomen and pelvis was performed using the standard protocol following bolus administration of intravenous contrast. RADIATION DOSE REDUCTION: This exam was performed according to the departmental dose-optimization program which includes automated exposure control, adjustment of the mA and/or kV according to patient size and/or use of iterative reconstruction technique. CONTRAST:  176m OMNIPAQUE IOHEXOL 300 MG/ML  SOLN COMPARISON:  06/04/2022 FINDINGS: Lower chest: Hypoventilatory changes are seen at the lung bases. Small bilateral effusions, left greater than right. Hepatobiliary: No focal liver abnormality is seen. No gallstones, gallbladder wall thickening, or biliary dilatation. Pancreas: Unremarkable. No pancreatic ductal dilatation or surrounding inflammatory changes. Spleen: Normal in size without focal  abnormality. Adrenals/Urinary Tract: The kidneys enhance normally and symmetrically. No urinary tract calculi or obstructive uropathy. The adrenals are unremarkable. Bladder is grossly normal. Stomach/Bowel: Postsurgical changes are seen from distal colectomy with diverting colostomy left lower quadrant. No bowel obstruction or ileus. Normal appendix right lower quadrant. Enteric catheter identified, tip within the proximal duodenum. Vascular/Lymphatic: No significant vascular findings are present. No enlarged abdominal or pelvic lymph nodes. Reproductive: Stable uterine fibroid and IUD.  No adnexal masses. Other: There is free fluid throughout the abdomen and pelvis. Fluid within the left lower quadrant and upper pelvis is partially loculated, without rim enhancement to suggest organized abscess at this time. Small amount of free intraperitoneal gas consistent with recent surgical intervention. Postsurgical changes related to midline laparotomy, with surgical packing in place. Musculoskeletal: No acute or destructive bony lesions. Reconstructed images demonstrate no additional findings. IMPRESSION: 1. Free fluid throughout the abdomen and pelvis. Fluid in the left lower quadrant and upper pelvis is partially loculated, without  rim enhancement to suggest organized abscess at this time. Continued follow-up is recommended. 2. Postsurgical changes from midline laparotomy, distal colectomy, and diverting colostomy within the left lower quadrant. 3. Small bilateral pleural effusions, left greater than right, with bibasilar hypoventilatory changes. 4. Stable uterine fibroid. Electronically Signed   By: Randa Ngo M.D.   On: 06/10/2022 19:45   CT ABDOMEN PELVIS W CONTRAST  Result Date: 06/04/2022 CLINICAL DATA:  Evaluate diverticulitis.  Complications suspected. EXAM: CT ABDOMEN AND PELVIS WITH CONTRAST TECHNIQUE: Multidetector CT imaging of the abdomen and pelvis was performed using the standard protocol following  bolus administration of intravenous contrast. RADIATION DOSE REDUCTION: This exam was performed according to the departmental dose-optimization program which includes automated exposure control, adjustment of the mA and/or kV according to patient size and/or use of iterative reconstruction technique. CONTRAST:  170m OMNIPAQUE IOHEXOL 300 MG/ML  SOLN COMPARISON:  CT abdomen and pelvis 05/31/2022 FINDINGS: Lower chest: Compressive atelectasis at both lower lobes. Volume loss at the lung bases has progressed since 05/31/2022. Trace pleural fluid bilaterally. Hepatobiliary: Increased perihepatic ascites. Small amount of free air along the anterior aspect of the liver. Layering sludge or stones in the gallbladder. No discrete hepatic lesion. No intrahepatic or extrahepatic biliary dilatation. Pancreas: Unremarkable. No pancreatic ductal dilatation or surrounding inflammatory changes. Spleen: Normal in size without focal abnormality. Adrenals/Urinary Tract: Normal adrenal glands. Small hypodensity in the posterior left kidney upper pole likely represents a cyst and this not require dedicated follow-up. No hydronephrosis. No suspicious renal lesions. Urinary bladder is decompressed. Stomach/Bowel: Again noted are colonic diverticula in the proximal sigmoid colon with some inflammatory changes in this area. There may be another prominent colonic diverticulum along the left side of the colon on sequence 2 image 40 versus a small pericolonic fluid collection. Increased dilatation of small bowel loops in the left upper abdomen. These dilated loops of small bowel contain oral contrast. Distal small bowel loops are decompressed. In addition, there may be focal narrowing along the left side of the transverse colon best seen on sequence 2 image 35 and this represents an interval change. This area of colonic narrowing is adjacent to the dilated loops of small bowel. Increased mesenteric edema particularly associated with the area  of small bowel dilatation. Vascular/Lymphatic: Vascular structures are unremarkable. Normal caliber of the abdominal aorta. Portal venous system is patent. There is no significant lymph node enlargement in the abdomen or pelvis. Reproductive: Again noted is a hyperdense or enhancing large structure associated with the uterus that measures 6.2 cm and likely represents a large uterine fibroid. Again noted is an IUD within the uterus. Limited evaluation of the adnexa. Other: Markedly increased mesenteric edema throughout the abdomen. Again noted are small pockets of free air in the abdomen which have slightly increased. There are multiple small pockets of fluid in the abdomen or pelvis which have progressed. Fluid in the cul-de-sac measures 3.0 x 4.7 cm. Increased fluid in the right lower quadrant on sequence 2 image 70 and increased focus of fluid in the anterior lower abdomen on image 71. Increased fluid in the right paracolic gutter and around the liver. Question a focus of fluid with gas versus a prominent diverticulum along left colon on image 39. Increased loculated / irregular fluid in the lower left paracolic gutter. Musculoskeletal: No acute bone abnormality. IMPRESSION: 1. Worsening inflammatory process throughout the abdomen and pelvis. Increased ascites throughout the abdomen with multiple small fluid pockets. There small fluid pockets concerning for developing abscess collections  throughout the abdomen and pelvis. In addition, there is slightly increased free air within the abdomen. Findings are compatible with the sequelae of perforated diverticulitis. Source of the diverticulitis is probably from the proximal sigmoid colon but less conspicuous compared to the previous CT examination. 2. Increased dilatation of proximal small bowel loops with increased mesenteric edema in this area. There is also focal narrowing of the transverse colon adjacent to the small bowel dilatation. These findings raise concern  for a developing bowel obstruction. 3. Trace pleural effusions with increased atelectasis at the lung bases. 4. Gallbladder sludge and/or stones. 5. Prominent uterine fibroid. These results were called by telephone at the time of interpretation on 06/04/2022 at 12:37 pm to provider [facility] , who verbally acknowledged these results. Electronically Signed   By: Markus Daft M.D.   On: 06/04/2022 12:41   DG Abd Portable 1V  Result Date: 06/01/2022 CLINICAL DATA:  Abdominal pain EXAM: PORTABLE ABDOMEN - 1 VIEW COMPARISON:  None Available. FINDINGS: Examination is technically difficult due to the patient's body habitus. As far as seen bowel gas pattern is nonspecific. No abnormal masses or calcifications are seen. IUD is seen in the pelvis. Degenerative changes are noted in the lumbar spine. IMPRESSION: Nonspecific bowel gas pattern.  Lumbar spondylosis. Electronically Signed   By: Elmer Picker M.D.   On: 06/01/2022 11:36   CT ABDOMEN PELVIS W CONTRAST  Result Date: 06/01/2022 CLINICAL DATA:  Acute abdominal pain, history of diverticulitis EXAM: CT ABDOMEN AND PELVIS WITH CONTRAST TECHNIQUE: Multidetector CT imaging of the abdomen and pelvis was performed using the standard protocol following bolus administration of intravenous contrast. RADIATION DOSE REDUCTION: This exam was performed according to the departmental dose-optimization program which includes automated exposure control, adjustment of the mA and/or kV according to patient size and/or use of iterative reconstruction technique. CONTRAST:  118m OMNIPAQUE IOHEXOL 300 MG/ML  SOLN COMPARISON:  05/29/2022 FINDINGS: Lower chest: Mild atelectatic changes are noted in the bases. Hepatobiliary: No focal liver abnormality is seen. No gallstones, gallbladder wall thickening, or biliary dilatation. Pancreas: Unremarkable. No pancreatic ductal dilatation or surrounding inflammatory changes. Spleen: Normal in size without focal abnormality.  Adrenals/Urinary Tract: Adrenal glands are within normal limits. Kidneys demonstrate a normal enhancement pattern bilaterally. Small nonobstructing left renal stone is noted. The ureters are within normal limits. The bladder is well distended. Stomach/Bowel: Colon again demonstrates changes of sigmoid diverticulitis. The degree of inflammatory change has increased in the interval from the prior exam. Multiple small foci of free air are noted within the abdomen consistent with micro perforations. This is consistent with the progressive inflammatory change. More proximal colon is unremarkable. The appendix is within normal limits. Small bowel and stomach are within normal limits. Vascular/Lymphatic: No significant vascular findings are present. No enlarged abdominal or pelvic lymph nodes. Reproductive: Large uterine fibroid is again identified. IUD is noted in place. Other: Small fat containing umbilical hernia is noted. New free fluid is noted in the pelvis likely related to the perforations. Musculoskeletal: Degenerative changes of lumbar spine are noted. No acute bony abnormality is seen. IMPRESSION: Increase in the degree of sigmoid diverticulitis with evidence of new micro perforations and free air. Mild increase in free fluid is noted within the pelvis. No abscess is seen at this time. Dominant fibroid with IUD in place. Left renal calculus without obstructive change. Electronically Signed   By: MInez CatalinaM.D.   On: 06/01/2022 00:16   CT Abdomen Pelvis W Contrast  Result Date: 05/29/2022  CLINICAL DATA:  LLQ abdominal pain EXAM: CT ABDOMEN AND PELVIS WITH CONTRAST TECHNIQUE: Multidetector CT imaging of the abdomen and pelvis was performed using the standard protocol following bolus administration of intravenous contrast. RADIATION DOSE REDUCTION: This exam was performed according to the departmental dose-optimization program which includes automated exposure control, adjustment of the mA and/or kV  according to patient size and/or use of iterative reconstruction technique. CONTRAST:  153m OMNIPAQUE IOHEXOL 300 MG/ML  SOLN COMPARISON:  None Available. FINDINGS: Lower chest: No acute abnormality. Hepatobiliary: No focal liver abnormality is seen. No gallstones, gallbladder wall thickening, or biliary dilatation. Pancreas: Unremarkable. No pancreatic ductal dilatation or surrounding inflammatory changes. Spleen: Normal in size without focal abnormality. Adrenals/Urinary Tract: Adrenal glands are unremarkable. Kidneys are normal, without renal calculi, focal lesion, or hydronephrosis except for couple of 2-3 mm hypodense areas at the upper pole of the left kidney likely cysts. Bladder is unremarkable. Stomach/Bowel: Stomach and small intestines have a normal appearance. Moderate diverticulosis of the descending sigmoid colon. There is approximately 8 cm segment of severe sigmoid colonic wall thickening and pericolonic inflammatory stranding seen (images 64/5, 52 through 59 of series 2) consistent with diverticulitis. No fluid or abscess. Appendix is normal. Vascular/Lymphatic: No significant vascular findings are present. No enlarged abdominal or pelvic lymph nodes. Reproductive: There is a large intramural fibroid at the left fundus/body of the uterus measuring approximally 8.4 x 5.5 by 5.5 cm causing right and posterior displacement of the intrauterine device. Other: Small umbilical hernia containing fat. Musculoskeletal: No acute or significant osseous findings. IMPRESSION: Approximately 8 cm segment of mid sigmoid colonic diverticulitis with severe sigmoid colonic wall thickening and pericolonic stranding as described above. No pericolonic fluid or abscess seen. Large uterine fibroid at the left fundus/body of the uterus causing right and posterior displacement of the IUD. Electronically Signed   By: AFrazier RichardsM.D.   On: 05/29/2022 07:41   (Echo, Carotid, EGD, Colonoscopy, ERCP)     Subjective:   Discharge Exam: Vitals:   06/17/22 0521 06/17/22 0848  BP: (!) 99/58 98/72  Pulse: 76 94  Resp: 20 18  Temp: 98.2 F (36.8 C) 98.2 F (36.8 C)  SpO2: 99% 96%   Vitals:   06/16/22 1453 06/16/22 2043 06/17/22 0521 06/17/22 0848  BP: 110/72 113/65 (!) 99/58 98/72  Pulse: 97 88 76 94  Resp: '16 20 20 18  '$ Temp: 98.3 F (36.8 C) 98.7 F (37.1 C) 98.2 F (36.8 C) 98.2 F (36.8 C)  TempSrc: Oral Oral Oral Oral  SpO2: 97% 98% 99% 96%  Weight:      Height:        General: Pt is alert, awake, not in acute distress Cardiovascular: RRR, S1/S2 +, no rubs, no gallops Respiratory: CTA bilaterally, no wheezing, no rhonchi Abdominal: Soft, NT, ND, bowel sounds + Extremities: no edema, no cyanosis    The results of significant diagnostics from this hospitalization (including imaging, microbiology, ancillary and laboratory) are listed below for reference.     Microbiology: Recent Results (from the past 240 hour(s))  Aerobic/Anaerobic Culture w Gram Stain (surgical/deep wound)     Status: None   Collection Time: 06/11/22  2:14 PM   Specimen: Abscess  Result Value Ref Range Status   Specimen Description   Final    ABSCESS [facility]formed at [facility], [place] [address]   Special Requests   Final    ABDOMEN [facility] [address] [place], [place] [address]    Gram Stain NO WBC SEEN NO ORGANISMS SEEN   Final   Culture   Final    No growth aerobically or anaerobically. Performed at [facility], [place] [address], [place], [place] [address]    Report Status 06/16/2022 FINAL  Final     Labs: BNP (last 3 results) No results for input(s): "BNP" in the last 8760 hours. Basic Metabolic Panel: Recent Labs  Lab 06/12/22 0436 06/13/22 0450 06/14/22 0400 06/16/22 0425 06/17/22 0433  NA 139 139 141 139 142  K 3.8 3.7 3.5 3.6 3.5  CL 102 104 104 105 109  CO2 '28 28 29 28 27   '$ GLUCOSE 107* 105* 111* 129* 120*  BUN <5* <5* 5* 9 8  CREATININE 0.60 0.52 0.76 0.67 0.78  CALCIUM 8.2* 8.2* 8.1* 8.0* 8.4*  MG 2.0  --   --   --   --    Liver Function Tests: Recent Labs  Lab 06/11/22 0432 06/12/22 0436 06/16/22 0425  AST '21 15 17  '$ ALT '26 23 20  '$ ALKPHOS 72 76 87  BILITOT 1.0 1.1 0.5  PROT 5.5* 5.7* 6.0*  ALBUMIN 2.2* 2.1* 2.2*   No results for input(s): "LIPASE", "AMYLASE" in the last 168 hours. No results for input(s): "AMMONIA" in the last 168 hours. CBC: Recent Labs  Lab 06/12/22 0436 06/13/22 0450 06/14/22 0400 06/16/22 0425 06/17/22 0433  WBC 15.1* 13.4* 12.4* 11.6* 9.9  HGB 9.3* 9.2* 8.9* 8.6* 8.9*  HCT 29.1* 28.8* 28.6* 27.3* 28.7*  MCV 90.1 91.1 91.1 90.7 91.1  PLT 387 443* 473* 477* 517*   Cardiac Enzymes: No results for input(s): "CKTOTAL", "CKMB", "CKMBINDEX", "TROPONINI" in the last 168 hours. BNP: Invalid input(s): "POCBNP" CBG: Recent Labs  Lab 06/15/22 2338 06/16/22 0731 06/16/22 1637 06/17/22 0002 06/17/22 0811  GLUCAP 119* 119* 136* 121* 114*   D-Dimer No results for input(s): "DDIMER" in the last 72 hours. Hgb A1c No results for input(s): "HGBA1C" in the last 72 hours. Lipid Profile No results for input(s): "CHOL", "HDL", "LDLCALC", "TRIG", "CHOLHDL", "LDLDIRECT" in the last 72 hours. Thyroid function studies No results for input(s): "TSH", "T4TOTAL", "T3FREE", "THYROIDAB" in the last 72 hours.  Invalid input(s): "FREET3" Anemia work up No results for input(s): "VITAMINB12", "FOLATE", "FERRITIN", "TIBC", "IRON", "RETICCTPCT" in the last 72 hours. Urinalysis    Component Value Date/Time   COLORURINE YELLOW 06/01/2022 0129   APPEARANCEUR CLEAR 06/01/2022 0129   LABSPEC <=1.005 06/01/2022 0129   PHURINE 5.5 06/01/2022 0129   GLUCOSEU NEGATIVE 06/01/2022 0129   HGBUR NEGATIVE 06/01/2022 0129   BILIRUBINUR NEGATIVE 06/01/2022 0129   BILIRUBINUR neg 03/25/2020 1517   KETONESUR NEGATIVE 06/01/2022 0129   PROTEINUR  NEGATIVE 06/01/2022 0129   UROBILINOGEN 0.2 03/25/2020 1517   UROBILINOGEN 0.2 11/28/2014 0735   NITRITE NEGATIVE 06/01/2022 0129   LEUKOCYTESUR NEGATIVE 06/01/2022 0129   Sepsis Labs Recent Labs  Lab 06/13/22 0450 06/14/22 0400 06/16/22 0425 06/17/22 0433  WBC 13.4* 12.4* 11.6* 9.9   Microbiology Recent Results (from the past 240 hour(s))  Aerobic/Anaerobic Culture w Gram Stain (surgical/deep wound)     Status: None   Collection Time: 06/11/22  2:14 PM   Specimen: Abscess  Result Value Ref Range Status   Specimen Description   Final    ABSCESS Performed at [facility], [place] [address], [place], [place] [address]    Special Requests   Final    ABDOMEN Performed at [facility], [place] [address], [place], [place] [address]    Gram Stain NO WBC SEEN NO ORGANISMS SEEN   Final   Culture   Final    No growth aerobically or anaerobically. Performed at [facility], [place] [address], [place], [place] [address]    Report Status 06/16/2022 FINAL  Final     Time coordinating discharge: Over 30 minutes  SIGNED:   Georgette Shell, MD  [facility] 06/17/2022, 9:44 AM Pager   If 7PM-7AM, please contact night-coverage [email or web address] Password [email or web address]

## 2022-06-17 NOTE — TOC Progression Note (Addendum)
Transition of Care [facility]) - Progression Note    Patient Details  Name: LARUA COLLIER MRN: [id number] Date of Birth: Apr 10, 1971  Transition of Care [facility] CM/SW Contact  Leeroy Cha, RN Phone Number: 06/17/2022, 10:53 AM  Clinical Narrative:    Per Juliann Pulse at [facility] health care the blue cross blue shield that she has does not have snf benefits. Benefits doubled checked no benefits for snf. Md alerted that she will have to go home with wet to dry dressings. [facility] -message eft to see if they can do the dressing changes for the wound vac [facility]-can only do vac changes once a week. Expected Discharge Plan: [place] Barriers to Discharge: No [facility] will accept this patient  Expected Discharge Plan and Services Expected Discharge Plan: [place]   Discharge Planning Services: CM Consult Post Acute Care Choice: Durable Medical Equipment, Home Health   Expected Discharge Date: 06/17/22               DME Arranged: Negative pressure wound device DME Agency: [facility] Date DME Agency Contacted: 06/15/22 Time DME Agency Contacted: 2 Representative spoke with at DME Agency: tracey barrow HH Arranged: RN   Date [place]: 06/15/22       Social Determinants of Health ([place]) Interventions    Readmission Risk Interventions    06/01/2022   11:45 AM  Readmission Risk Prevention Plan  Post Dischage Appt Complete  Medication Screening Complete  Transportation Screening Complete

## 2022-06-17 NOTE — Progress Notes (Signed)
PROGRESS NOTE    Morgan Cline  EHO:[id number] DOB: 27-Aug-1971 DOA: 05/31/2022 PCP: [facility]   Brief Narrative: 51 year old female with history of prediabetes, obesity, comes to the hospital with abdominal pain.  She was recently seen in the ED couple days prior to admission with sigmoid diverticulitis, she was given Augmentin and sent home, however returned due to worsening pain.  A CT scan showed worsening perforated diverticulitis with abscess formation.  General surgery was consulted and eventually underwent Hartman's procedure on 6/30.  Assessment & Plan:   Principal Problem:   Perforation of sigmoid colon due to diverticulitis Active Problems:   Obesity, Class III, BMI 40-49.9 (morbid obesity) (HCC)   Prediabetes   Hyponatremia   Hypokalemia   Bandemia   Normocytic anemia    Sigmoid colon perforation due to diverticulitis, multiple abscesses, postop ileus-appreciate general surgery follow-up.  She is status post sigmoid colectomy and end colostomy, takedown of splenic flexure by Dr. Barry Dienes on 6/30.  Postop course complicated by ileus, NG tube, now resolved, NG tube was removed in the interval and she is eating well.  She had persistent WBC elevation and underwent a CT scan of the abdomen and pelvis 7/5 which showed a left-sided fluid collection.  She is status post IR guided drainage on 7/6.  WBC continues to improve, cultures without growth.  Completing Zosyn 06/16/2022   Active problems  Hyponatremia-resolved Hypokalemia -resolved Thrombocytosis -likely reactive monitor Normocytic Anemia -no bleeding, monitor   Pre-Diabetes-CBGs with good control on Ozempic prior to admission    Estimated body mass index is 40.34 kg/m as calculated from the following:   Height as of this encounter: '5\' 4"'$  (1.626 m).   Weight as of this encounter: 106.6 kg.  DVT prophylaxis: Lovenox  code Status: Full code Family Communication: None Disposition Plan:  Status is:  Inpatient Remains inpatient appropriate because: Await safe DC plan   Consultants:  General surgery  Subjective: Patient resting in bed she is anxious to go home by herself since she will not be able to take care of the wound VAC she lives with her son  Objective: Vitals:   06/16/22 1453 06/16/22 2043 06/17/22 0521 06/17/22 0848  BP: 110/72 113/65 (!) 99/58 98/72  Pulse: 97 88 76 94  Resp: '16 20 20 18  '$ Temp: 98.3 F (36.8 C) 98.7 F (37.1 C) 98.2 F (36.8 C) 98.2 F (36.8 C)  TempSrc: Oral Oral Oral Oral  SpO2: 97% 98% 99% 96%  Weight:      Height:        Intake/Output Summary (Last 24 hours) at 06/17/2022 1626 Last data filed at 06/17/2022 0600 Gross per 24 hour  Intake 5 ml  Output 125 ml  Net -120 ml   Filed Weights   05/31/22 2156 06/01/22 0410 06/05/22 1154  Weight: 106.6 kg 115.3 kg 106.6 kg    Examination:  General exam: Appears calm and comfortable  Respiratory system: Clear to auscultation. Respiratory effort normal. Cardiovascular system: S1 & S2 heard, RRR. No JVD, murmurs, rubs, gallops or clicks. No pedal edema. Gastrointestinal system: Abdomen is nondistended, soft and nontender. No organomegaly or masses felt. Normal bowel sounds heard.  Wound VAC in place, drain in place Central nervous system: Alert and oriented. No focal neurological deficits. Extremities: Symmetric 5 x 5 power. Skin: No rashes, lesions or ulcers Psychiatry: Judgement and insight appear normal. Mood & affect appropriate.     Data Reviewed: I have personally reviewed following labs and imaging studies  CBC: Recent Labs  Lab 06/12/22 0436 06/13/22 0450 06/14/22 0400 06/16/22 0425 06/17/22 0433  WBC 15.1* 13.4* 12.4* 11.6* 9.9  HGB 9.3* 9.2* 8.9* 8.6* 8.9*  HCT 29.1* 28.8* 28.6* 27.3* 28.7*  MCV 90.1 91.1 91.1 90.7 91.1  PLT 387 443* 473* 477* 793*   Basic Metabolic Panel: Recent Labs  Lab 06/12/22 0436 06/13/22 0450 06/14/22 0400 06/16/22 0425 06/17/22 0433  NA  139 139 141 139 142  K 3.8 3.7 3.5 3.6 3.5  CL 102 104 104 105 109  CO2 '28 28 29 28 27  '$ GLUCOSE 107* 105* 111* 129* 120*  BUN <5* <5* 5* 9 8  CREATININE 0.60 0.52 0.76 0.67 0.78  CALCIUM 8.2* 8.2* 8.1* 8.0* 8.4*  MG 2.0  --   --   --   --    GFR: Estimated Creatinine Clearance: 102.5 mL/min (by C-G formula based on SCr of 0.78 mg/dL). Liver Function Tests: Recent Labs  Lab 06/11/22 0432 06/12/22 0436 06/16/22 0425  AST '21 15 17  '$ ALT '26 23 20  '$ ALKPHOS 72 76 87  BILITOT 1.0 1.1 0.5  PROT 5.5* 5.7* 6.0*  ALBUMIN 2.2* 2.1* 2.2*   No results for input(s): "LIPASE", "AMYLASE" in the last 168 hours. No results for input(s): "AMMONIA" in the last 168 hours. Coagulation Profile: No results for input(s): "INR", "PROTIME" in the last 168 hours. Cardiac Enzymes: No results for input(s): "CKTOTAL", "CKMB", "CKMBINDEX", "TROPONINI" in the last 168 hours. BNP (last 3 results) No results for input(s): "PROBNP" in the last 8760 hours. HbA1C: No results for input(s): "HGBA1C" in the last 72 hours. CBG: Recent Labs  Lab 06/16/22 0731 06/16/22 1637 06/17/22 0002 06/17/22 0811 06/17/22 1611  GLUCAP 119* 136* 121* 114* 115*   Lipid Profile: No results for input(s): "CHOL", "HDL", "LDLCALC", "TRIG", "CHOLHDL", "LDLDIRECT" in the last 72 hours. Thyroid Function Tests: No results for input(s): "TSH", "T4TOTAL", "FREET4", "T3FREE", "THYROIDAB" in the last 72 hours. Anemia Panel: No results for input(s): "VITAMINB12", "FOLATE", "FERRITIN", "TIBC", "IRON", "RETICCTPCT" in the last 72 hours. Sepsis Labs: No results for input(s): "PROCALCITON", "LATICACIDVEN" in the last 168 hours.  Recent Results (from the past 240 hour(s))  Aerobic/Anaerobic Culture w Gram Stain (surgical/deep wound)     Status: None   Collection Time: 06/11/22  2:14 PM   Specimen: Abscess  Result Value Ref Range Status   Specimen Description   Final    ABSCESS Performed at [place]  [address], [place], [place] [address]    Special Requests   Final    ABDOMEN Performed at [facility], [place] [address] [place], [place] [address]    Gram Stain NO WBC SEEN NO ORGANISMS SEEN   Final   Culture   Final    No growth aerobically or anaerobically. Performed at [facility], [place] [address], [place], [place] [address]    Report Status 06/16/2022 FINAL  Final         Radiology Studies: CT ABDOMEN PELVIS W CONTRAST  Result Date: 06/16/2022 CLINICAL DATA:  s/p LUQ fluid collection status post drain insertion on 06/11/2022, follow-up study EXAM: CT ABDOMEN AND PELVIS WITH CONTRAST TECHNIQUE: Multidetector CT imaging of the abdomen and pelvis was performed using the standard protocol following bolus administration of intravenous contrast. RADIATION DOSE REDUCTION: This exam was performed according to the departmental dose-optimization program which includes automated exposure control, adjustment of the mA and/or kV according to patient size and/or use of iterative reconstruction  technique. CONTRAST:  153m OMNIPAQUE IOHEXOL 300 MG/ML  SOLN COMPARISON:  June 10, 2022 FINDINGS: Lower chest: There is consolidation at the left lung base with some air bronchograms and has increased in the interim. Small left pleural effusion seen. Hepatobiliary: No focal liver abnormality is seen. No gallstones, gallbladder wall thickening, or biliary dilatation. Pancreas: Unremarkable. No pancreatic ductal dilatation or surrounding inflammatory changes. Spleen: Normal in size without focal abnormality. Adrenals/Urinary Tract: Adrenal glands are unremarkable. Kidneys are normal, without renal calculi, focal lesion, or hydronephrosis. Bladder is unremarkable. Stomach/Bowel: Again seen are the postsurgical distal colectomy with diverting colostomy left lower quadrant changes. No bowel obstruction. There has been interval removal of the enteric tube. There is some contrast seen in the distal  sigmoid and rectum. Vascular/Lymphatic: No significant vascular findings are present. No enlarged abdominal or pelvic lymph nodes. Reproductive: Stable uterine fibroid and IUD. Other: There are multiple loculated fluid pockets. There has been interval drain insertion into the fluid collection at the left paracolic gutter. There is residual fluid collection seen along the left paracolic gutter measuring approximally 3.2 cm transverse by 10.3 cm craniocaudad. There is loculated fluid collection seen at the right flank measuring 5.3 x 2.3 x 5.1 cm in comparison to 6.5 x 2.2 x 7.2 cm on the previous study. There is some loculated fluid collection seen at the posterior mesentery in the lower abdomen-upper pelvis measuring approximately 4.7 cm transverse x 1.3 cm AP x 4.5 cm craniocaudad without significant interval change. There is some fluid around the fundus of the uterus and has decreased in the interim measuring approximally 1 cm in the AP dimension in comparison to 2.2 cm on the previous study. There is a small fluid collection in the subcutaneous soft tissues of the left anterior abdominal wall just medial to the ostomy tract without significant interval change. Moderate inflammatory stranding at the left mid to lower anterior abdominal wall. Musculoskeletal: No acute or significant osseous findings. IMPRESSION: 1. Stable distal colectomy with a diverting left lower quadrant colostomy changes. 2. There has been interval drain insertion into the fluid collection along the left paracolic gutter. There is residual fluid collection seen at the catheter insertion site measuring approximately 3.2 cm transverse by 10.3 cm craniocaudad. In addition, there are small multiple loculated fluid pockets seen and have decreased in the interim. The fluid pocket at the right upper flank measures 5.3 x 2.3 x 5.1 cm in comparison to 6.5 x 2.2 x 7.2 cm on the previous study. Small loculated fluid collection at the posterior mesentery  in the lower abdomen-upper pelvis is without significant interval change. Fluid collection along the fundus of the uterus has decreased in the interim. 3. There is consolidation at the left lung base with some air bronchograms and has increased in the interim. Small left pleural effusion. Electronically Signed   By: AFrazier RichardsM.D.   On: 06/16/2022 13:28        Scheduled Meds:  acetaminophen  1,000 mg Oral Q6H   enoxaparin (LOVENOX) injection  40 mg Subcutaneous Q24H   feeding supplement  237 mL Oral BID BM   hydrocortisone cream   Topical QID   methocarbamol  500 mg Oral Q6H   sodium chloride flush  5 mL Intracatheter Q8H   Continuous Infusions:  promethazine (PHENERGAN) injection (IM or IVPB)       LOS: 16 days    Time spent: 35  min  EGeorgette Shell MD 06/17/2022, 4:26 PM

## 2022-06-18 DIAGNOSIS — K5720 Diverticulitis of large intestine with perforation and abscess without bleeding: Secondary | ICD-10-CM | POA: Diagnosis not present

## 2022-06-18 LAB — GLUCOSE, CAPILLARY
Glucose-Capillary: 115 mg/dL — ABNORMAL HIGH (ref 70–99)
Glucose-Capillary: 122 mg/dL — ABNORMAL HIGH (ref 70–99)
Glucose-Capillary: 132 mg/dL — ABNORMAL HIGH (ref 70–99)

## 2022-06-18 MED ORDER — OXYCODONE HCL 5 MG PO TABS
5.0000 mg | ORAL_TABLET | ORAL | Status: DC | PRN
  Administered 2022-06-18 – 2022-06-20 (×4): 10 mg via ORAL
  Administered 2022-06-20 (×2): 5 mg via ORAL
  Administered 2022-06-20 – 2022-07-01 (×28): 10 mg via ORAL
  Filled 2022-06-18 (×21): qty 2
  Filled 2022-06-18: qty 1
  Filled 2022-06-18 (×5): qty 2
  Filled 2022-06-18: qty 1
  Filled 2022-06-18 (×7): qty 2

## 2022-06-18 MED ORDER — TRAMADOL HCL 50 MG PO TABS
50.0000 mg | ORAL_TABLET | Freq: Four times a day (QID) | ORAL | Status: DC | PRN
  Administered 2022-06-19 – 2022-06-22 (×4): 50 mg via ORAL
  Filled 2022-06-18 (×4): qty 1

## 2022-06-18 NOTE — Progress Notes (Addendum)
Physical Therapy Treatment Patient Details Name: Morgan Cline MRN: [id number] DOB: 1971/10/18 Today's Date: 06/18/2022   History of Present Illness 51 year old female with PMH of morbid obesity, prediabetes and migraine headache returning with worsening abdominal pain due to acute sigmoid diverticulitis despite oral antibiotics.  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    PT Comments    Pt ambulated 12' with RW, distance limited by severe "stabbing" pain in abdomen. Pt puts forth good effort, pain limiting activity tolerance.    Recommendations for follow up therapy are one component of a multi-disciplinary discharge planning process, led by the attending physician.  Recommendations may be updated based on patient status, additional functional criteria and insurance authorization.  Follow Up Recommendations  CIR     Assistance Recommended at Discharge    Patient can return home with the following A little help with walking and/or transfers;A little help with bathing/dressing/bathroom;Help with stairs or ramp for entrance;Assistance with cooking/housework;Assist for transportation   Equipment Recommendations  None recommended by PT    Recommendations for Other Services       Precautions / Restrictions Precautions Precautions: Fall Precaution Comments: multiple lines, colostomy, wound VAC Restrictions Weight Bearing Restrictions: No     Mobility  Bed Mobility               General bed mobility comments: up in recliner    Transfers Overall transfer level: Needs assistance Equipment used: Rolling walker (2 wheels) Transfers: Sit to/from Stand, Bed to chair/wheelchair/BSC Sit to Stand: Min assist           General transfer comment: increased time, VCs for technique, min A to power up    Ambulation/Gait Ambulation/Gait assistance: Supervision Gait Distance (Feet): 12 Feet Assistive device:  Rolling walker (2 wheels) Gait Pattern/deviations: Step-through pattern, Decreased stride length, Trunk flexed Gait velocity: decreased     General Gait Details: slow but functional gait increased complaints of ABD pain with all activity. Politely deferring additional ambulationdue to increased pain, VCs for posture and for relaxation breathing   Stairs             Wheelchair Mobility    Modified Rankin (Stroke Patients Only)       Balance Overall balance assessment: Needs assistance Sitting-balance support: Feet supported Sitting balance-Leahy Scale: Fair     Standing balance support: Bilateral upper extremity supported, During functional activity Standing balance-Leahy Scale: Fair Standing balance comment: able to stand statically without UE support                            Cognition Arousal/Alertness: Awake/alert Behavior During Therapy: WFL for tasks assessed/performed Overall Cognitive Status: Within Functional Limits for tasks assessed                                 General Comments: very pleasant and willing to participate, repeatedly apologizing to therapist for not being able to tolerate more activity this session due to pain        Exercises      General Comments        Pertinent Vitals/Pain Pain Assessment Pain Score: 8  Pain Location: abdomen Pain Descriptors / Indicators: Aching, Grimacing, Operative site guarding, Sore, Stabbing Pain Intervention(s): Limited activity within patient's tolerance, Monitored during session, Premedicated before session    Home Living  Prior Function            PT Goals (current goals can now be found in the care plan section) Acute Rehab PT Goals Patient Stated Goal: less pain and improve mobility PT Goal Formulation: With patient Time For Goal Achievement: 06/20/22 Potential to Achieve Goals: Good Progress towards PT goals: Progressing toward  goals    Frequency    Min 3X/week      PT Plan Current plan remains appropriate    Co-evaluation              AM-PAC PT "6 Clicks" Mobility   Outcome Measure  Help needed turning from your back to your side while in a flat bed without using bedrails?: A Little Help needed moving from lying on your back to sitting on the side of a flat bed without using bedrails?: A Little Help needed moving to and from a bed to a chair (including a wheelchair)?: A Little Help needed standing up from a chair using your arms (e.g., wheelchair or bedside chair)?: A Little Help needed to walk in hospital room?: A Little Help needed climbing 3-5 steps with a railing? : A Lot 6 Click Score: 17    End of Session Equipment Utilized During Treatment: Gait belt Activity Tolerance: Patient limited by pain Patient left: with call bell/phone within reach;Other (comment) (on bedside commode) Nurse Communication: Mobility status PT Visit Diagnosis: Other abnormalities of gait and mobility (R26.89);Pain     Time: 1310-1334 PT Time Calculation (min) (ACUTE ONLY): 24 min  Charges:  $Gait Training: 8-22 mins $Therapeutic Activity: 8-22 mins                     Blondell Reveal Kistler PT 06/18/2022  Acute Rehabilitation Services  Office [phone]

## 2022-06-18 NOTE — Progress Notes (Signed)
PT Cancellation Note  Patient Details Name: Morgan Cline MRN: [id number] DOB: [birth year]   Cancelled Treatment:    Reason Eval/Treat Not Completed: Pain limiting ability to participate (pt recently transferred from bed to chair and is sore from that. She was unable to tolerate mobility at present, she wants to attempt again this afternoon after receiving her muscle relaxer. Will follow.)  [name] PT 06/18/2022  Acute Rehabilitation Services  Office [phone]

## 2022-06-18 NOTE — Progress Notes (Addendum)
Inpatient Rehab Admissions Coordinator:  Consult received. On 06/16/22, PT recommended no PT follow up. PT has been asked to re-evaluate pt. Await updated PT notes.   ADDENDUM 1404: PT updated note continues to state no PT follow up. Pt does not appear to warrant the intensity of CIR. AC will sign off.   Gayland Curry, [place], [place] Admissions Coordinator [phone]

## 2022-06-18 NOTE — Progress Notes (Signed)
Referring Physician(s): Cornett,T  Supervising Physician: Ruthann Cancer  Patient Status:  Morgan Cline - In-pt  Chief Complaint:  Abdominal pain, fluid collection  Subjective: Pt states she is feeling "ok." She is sitting upright in chair eating lunch at time of exam. She states she is not having pain at her drain site but was concerned that her site might have a scab on it.   Allergies: Codeine, Compazine [prochlorperazine], and Pineapple  Medications: Prior to Admission medications   Medication Sig Start Date End Date Taking? Authorizing Provider  amoxicillin-clavulanate (AUGMENTIN) 875-125 MG tablet Take 1 tablet by mouth every 12 (twelve) hours. 05/29/22  Yes Jeanell Sparrow, DO  Aspirin-Salicylamide-Caffeine (BC HEADACHE POWDER PO) Take 1 packet by mouth daily as needed (headaches).   Yes [provider]  diphenhydramine-acetaminophen (TYLENOL PM) 25-500 MG TABS tablet Take 1 tablet by mouth at bedtime as needed (sleep).   Yes [provider]  Fiber Select Gummies CHEW Chew 3 tablets by mouth at bedtime.   Yes [provider]  HYDROcodone-acetaminophen (NORCO) 5-325 MG tablet Take 1 tablet by mouth every 6 (six) hours as needed for moderate pain. 05/29/22  Yes Wynona Dove A, DO  ibuprofen (ADVIL) 600 MG tablet Take 1 tablet (600 mg total) by mouth every 6 (six) hours as needed. Patient taking differently: Take 600 mg by mouth every 6 (six) hours as needed (pain). 05/29/22  Yes Jeanell Sparrow, DO  levonorgestrel (MIRENA) 20 MCG/24HR IUD 1 each by Intrauterine route once. Implanted September 2020   Yes [provider]  ondansetron (ZOFRAN) 4 MG tablet Take 1 tablet (4 mg total) by mouth every 4 (four) hours as needed for nausea or vomiting. 05/29/22  Yes Jeanell Sparrow, DO  Semaglutide, 1 MG/DOSE, (OZEMPIC, 1 MG/DOSE,) 4 MG/3ML SOPN Inject 1 mg into the skin every Sunday.   Yes [provider]     Vital Signs: BP 124/78 (BP Location: Right  Arm)   Pulse 96   Temp 98.3 F (36.8 C) (Oral)   Resp 20   Ht '5\' 4"'$  (1.626 m)   Wt 235 lb (106.6 kg)   LMP 06/09/2022   SpO2 97%   BMI 40.34 kg/m   Physical Exam Vitals reviewed.  Constitutional:      General: She is not in acute distress. Cardiovascular:     Pulses: Normal pulses.  Pulmonary:     Effort: Pulmonary effort is normal.  Skin:    General: Skin is warm and dry.     Comments: Drain insertion site non-erythematous, suture in place, dressed appropriately  Neurological:     Mental Status: She is alert and oriented to person, place, and time.  Psychiatric:        Mood and Affect: Mood normal.        Behavior: Behavior normal.     Drain Location: LUQ Size: Fr size: 10 Fr Date of placement: 06/11/22  Currently to: Drain collection device: suction bulb 24 hour output:  Output by Drain (mL) 06/16/22 0701 - 06/16/22 1900 06/16/22 1901 - 06/17/22 0700 06/17/22 0701 - 06/17/22 1900 06/17/22 1901 - 06/18/22 0700 06/18/22 0701 - 06/18/22 1236  Closed System Drain 2 Lateral;Superior LUQ Bulb (JP) 10 Fr. 55 10  15       Current examination: ~5 mL serosanguinous fluid in JP bulb today Flushes easily, does not aspirate Insertion site unremarkable. Suture and in place. Dressed appropriately. Dressing clean, dry, intact     Imaging: CT ABDOMEN PELVIS W  CONTRAST  Result Date: 06/16/2022 CLINICAL DATA:  s/p LUQ fluid collection status post drain insertion on 06/11/2022, follow-up study EXAM: CT ABDOMEN AND PELVIS WITH CONTRAST TECHNIQUE: Multidetector CT imaging of the abdomen and pelvis was performed using the standard protocol following bolus administration of intravenous contrast. RADIATION DOSE REDUCTION: This exam was performed according to the departmental dose-optimization program which includes automated exposure control, adjustment of the mA and/or kV according to patient size and/or use of iterative reconstruction technique. CONTRAST:  148m OMNIPAQUE IOHEXOL 300  MG/ML  SOLN COMPARISON:  June 10, 2022 FINDINGS: Lower chest: There is consolidation at the left lung base with some air bronchograms and has increased in the interim. Small left pleural effusion seen. Hepatobiliary: No focal liver abnormality is seen. No gallstones, gallbladder wall thickening, or biliary dilatation. Pancreas: Unremarkable. No pancreatic ductal dilatation or surrounding inflammatory changes. Spleen: Normal in size without focal abnormality. Adrenals/Urinary Tract: Adrenal glands are unremarkable. Kidneys are normal, without renal calculi, focal lesion, or hydronephrosis. Bladder is unremarkable. Stomach/Bowel: Again seen are the postsurgical distal colectomy with diverting colostomy left lower quadrant changes. No bowel obstruction. There has been interval removal of the enteric tube. There is some contrast seen in the distal sigmoid and rectum. Vascular/Lymphatic: No significant vascular findings are present. No enlarged abdominal or pelvic lymph nodes. Reproductive: Stable uterine fibroid and IUD. Other: There are multiple loculated fluid pockets. There has been interval drain insertion into the fluid collection at the left paracolic gutter. There is residual fluid collection seen along the left paracolic gutter measuring approximally 3.2 cm transverse by 10.3 cm craniocaudad. There is loculated fluid collection seen at the right flank measuring 5.3 x 2.3 x 5.1 cm in comparison to 6.5 x 2.2 x 7.2 cm on the previous study. There is some loculated fluid collection seen at the posterior mesentery in the lower abdomen-upper pelvis measuring approximately 4.7 cm transverse x 1.3 cm AP x 4.5 cm craniocaudad without significant interval change. There is some fluid around the fundus of the uterus and has decreased in the interim measuring approximally 1 cm in the AP dimension in comparison to 2.2 cm on the previous study. There is a small fluid collection in the subcutaneous soft tissues of the left  anterior abdominal wall just medial to the ostomy tract without significant interval change. Moderate inflammatory stranding at the left mid to lower anterior abdominal wall. Musculoskeletal: No acute or significant osseous findings. IMPRESSION: 1. Stable distal colectomy with a diverting left lower quadrant colostomy changes. 2. There has been interval drain insertion into the fluid collection along the left paracolic gutter. There is residual fluid collection seen at the catheter insertion site measuring approximately 3.2 cm transverse by 10.3 cm craniocaudad. In addition, there are small multiple loculated fluid pockets seen and have decreased in the interim. The fluid pocket at the right upper flank measures 5.3 x 2.3 x 5.1 cm in comparison to 6.5 x 2.2 x 7.2 cm on the previous study. Small loculated fluid collection at the posterior mesentery in the lower abdomen-upper pelvis is without significant interval change. Fluid collection along the fundus of the uterus has decreased in the interim. 3. There is consolidation at the left lung base with some air bronchograms and has increased in the interim. Small left pleural effusion. Electronically Signed   By: AFrazier RichardsM.D.   On: 06/16/2022 13:28    Labs:  CBC: Recent Labs    06/13/22 0450 06/14/22 0400 06/16/22 0425 06/17/22 0433  WBC 13.4*  12.4* 11.6* 9.9  HGB 9.2* 8.9* 8.6* 8.9*  HCT 28.8* 28.6* 27.3* 28.7*  PLT 443* 473* 477* 517*     COAGS: No results for input(s): "INR", "APTT" in the last 8760 hours.  BMP: Recent Labs    06/13/22 0450 06/14/22 0400 06/16/22 0425 06/17/22 0433  NA 139 141 139 142  K 3.7 3.5 3.6 3.5  CL 104 104 105 109  CO2 '28 29 28 27  '$ GLUCOSE 105* 111* 129* 120*  BUN <5* 5* 9 8  CALCIUM 8.2* 8.1* 8.0* 8.4*  CREATININE 0.52 0.76 0.67 0.78  GFRNONAA >60 >60 >60 >60     LIVER FUNCTION TESTS: Recent Labs    06/10/22 0559 06/11/22 0432 06/12/22 0436 06/16/22 0425  BILITOT 0.8 1.0 1.1 0.5  AST 33  '21 15 17  '$ ALT 35 '26 23 20  '$ ALKPHOS 74 72 76 87  PROT 5.8* 5.5* 5.7* 6.0*  ALBUMIN 2.2* 2.2* 2.1* 2.2*     Assessment and Plan: Pt with hx perf diverticulitis with mult abscesses, s/p sigmoid colectomy with end colostomy, takedown of splenic flexure 6/30; s/p drainage of LUQ fluid collection 7/6 (10 fr to JP); afebrile; drain fl cx neg  Interval imaging/drain manipulation:  CT performed 7/11 1. Stable distal colectomy with a diverting left lower quadrant colostomy changes.  2. There has been interval drain insertion into the fluid collection along the left paracolic gutter. There is residual fluid collection seen at the catheter insertion site measuring approximately 3.2 cm transverse by 10.3 cm craniocaudad.  In addition, there are small multiple loculated fluid pockets seen and have decreased in the interim. The fluid pocket at the right upper flank measures 5.3 x 2.3 x 5.1 cm in comparison to 6.5 x 2.2 x 7.2 cm on the previous study. Small loculated fluid collection at the posterior mesentery in the lower abdomen-upper pelvis is without significant interval change. Fluid collection along the fundus of the uterus has decreased in the interim.  3. There is consolidation at the left lung base with some air bronchograms and has increased in the interim. Small left pleural effusion.  Above d/w CCS; drain fluid appears serous, cx neg, output minimal and may well be ascitic; discussed with surgical team and they request drain to remain in place when discharged to SNF  Plan:   Patient no longer being discharged to SNF as insurance will not cover it. Current plan per primary team is to remain in hospital while alternative care arrangements are explored. Patient has had discharge orders placed for follow-up CT with IR 7-10 days after discharge. IR scheduler will contact patient with date/time of appointment. IR will continue to follow. Please contact IR service with any questions or concerns.     Electronically Signed: Lura Em, PA-C 06/18/2022, 12:36 PM   I spent a total of 15 Minutes at the the patient's bedside AND on the patient's hospital floor or unit, greater than 50% of which was counseling/coordinating care for left upper abdominal fluid collection drain    Patient ID: Corynne Scibilia Plouff, female   DOB: 02-11-1971, 51 y.o.   MRN: [id number]

## 2022-06-18 NOTE — Progress Notes (Signed)
PROGRESS NOTE    Morgan Cline  FUX:[id number] DOB: 1971-08-22 DOA: 05/31/2022 PCP: [facility]   Brief Narrative: 51 year old female with history of prediabetes, obesity, comes to the hospital with abdominal pain.  She was recently seen in the ED couple days prior to admission with sigmoid diverticulitis, she was given Augmentin and sent home, however returned due to worsening pain.  A CT scan showed worsening perforated diverticulitis with abscess formation.  General surgery was consulted and eventually underwent Hartman's procedure on 6/30.  Assessment & Plan:   Principal Problem:   Perforation of sigmoid colon due to diverticulitis Active Problems:   Obesity, Class III, BMI 40-49.9 (morbid obesity) (HCC)   Prediabetes   Hyponatremia   Hypokalemia   Bandemia   Normocytic anemia    Sigmoid colon perforation due to diverticulitis, multiple abscesses, postop ileus-appreciate general surgery follow-up.  She is status post sigmoid colectomy and end colostomy, takedown of splenic flexure by Dr. Barry Dienes on 6/30.  Postop course complicated by ileus, NG tube, now resolved, NG tube was removed in the interval and she is eating well.  She had persistent WBC elevation and underwent a CT scan of the abdomen and pelvis 7/5 which showed a left-sided fluid collection.  She is status post IR guided drainage on 7/6.  WBC continues to improve, cultures without growth.  Completed Zosyn 06/16/2022   Active problems  Hyponatremia-resolved Hypokalemia -resolved Thrombocytosis -likely reactive monitor Normocytic Anemia -no bleeding, monitor   Pre-Diabetes-CBGs with good control on Ozempic prior to admission    Estimated body mass index is 40.34 kg/m as calculated from the following:   Height as of this encounter: '5\' 4"'$  (1.626 m).   Weight as of this encounter: 106.6 kg.  DVT prophylaxis: Lovenox  code Status: Full code Family Communication: None Disposition Plan:  Status is:  Inpatient Remains inpatient appropriate because: Await safe DC plan   Consultants:  General surgery  Subjective:  She feels weak she does not think she is able to care for herself at home and able to change wound VAC Objective: Vitals:   06/17/22 0848 06/17/22 2044 06/18/22 0550 06/18/22 1235  BP: 98/72 120/76 108/78 124/78  Pulse: 94 (!) 110 86 96  Resp: '18 20 20   '$ Temp: 98.2 F (36.8 C) 98 F (36.7 C) 98.1 F (36.7 C) 98.3 F (36.8 C)  TempSrc: Oral Oral Oral Oral  SpO2: 96% 95% 96% 97%  Weight:      Height:        Intake/Output Summary (Last 24 hours) at 06/18/2022 1525 Last data filed at 06/18/2022 1411 Gross per 24 hour  Intake 125 ml  Output 15 ml  Net 110 ml    Filed Weights   05/31/22 2156 06/01/22 0410 06/05/22 1154  Weight: 106.6 kg 115.3 kg 106.6 kg    Examination:  General exam: Appears calm and comfortable  Respiratory system: Clear to auscultation. Respiratory effort normal. Cardiovascular system: S1 & S2 heard, RRR. No JVD, murmurs, rubs, gallops or clicks. No pedal edema. Gastrointestinal system: Abdomen is nondistended, soft and nontender. No organomegaly or masses felt. Normal bowel sounds heard.  Wound VAC in place, drain in place Central nervous system: Alert and oriented. No focal neurological deficits. Extremities: Symmetric 5 x 5 power. Skin: No rashes, lesions or ulcers Psychiatry: Judgement and insight appear normal. Mood & affect appropriate.     Data Reviewed: I have personally reviewed following labs and imaging studies  CBC: Recent Labs  Lab 06/12/22 0436  06/13/22 0450 06/14/22 0400 06/16/22 0425 06/17/22 0433  WBC 15.1* 13.4* 12.4* 11.6* 9.9  HGB 9.3* 9.2* 8.9* 8.6* 8.9*  HCT 29.1* 28.8* 28.6* 27.3* 28.7*  MCV 90.1 91.1 91.1 90.7 91.1  PLT 387 443* 473* 477* 517*    Basic Metabolic Panel: Recent Labs  Lab 06/12/22 0436 06/13/22 0450 06/14/22 0400 06/16/22 0425 06/17/22 0433  NA 139 139 141 139 142  K 3.8 3.7 3.5  3.6 3.5  CL 102 104 104 105 109  CO2 '28 28 29 28 27  '$ GLUCOSE 107* 105* 111* 129* 120*  BUN <5* <5* 5* 9 8  CREATININE 0.60 0.52 0.76 0.67 0.78  CALCIUM 8.2* 8.2* 8.1* 8.0* 8.4*  MG 2.0  --   --   --   --     GFR: Estimated Creatinine Clearance: 102.5 mL/min (by C-G formula based on SCr of 0.78 mg/dL). Liver Function Tests: Recent Labs  Lab 06/12/22 0436 06/16/22 0425  AST 15 17  ALT 23 20  ALKPHOS 76 87  BILITOT 1.1 0.5  PROT 5.7* 6.0*  ALBUMIN 2.1* 2.2*    No results for input(s): "LIPASE", "AMYLASE" in the last 168 hours. No results for input(s): "AMMONIA" in the last 168 hours. Coagulation Profile: No results for input(s): "INR", "PROTIME" in the last 168 hours. Cardiac Enzymes: No results for input(s): "CKTOTAL", "CKMB", "CKMBINDEX", "TROPONINI" in the last 168 hours. BNP (last 3 results) No results for input(s): "PROBNP" in the last 8760 hours. HbA1C: No results for input(s): "HGBA1C" in the last 72 hours. CBG: Recent Labs  Lab 06/17/22 0002 06/17/22 0811 06/17/22 1611 06/18/22 0005 06/18/22 0725  GLUCAP 121* 114* 115* 132* 115*    Lipid Profile: No results for input(s): "CHOL", "HDL", "LDLCALC", "TRIG", "CHOLHDL", "LDLDIRECT" in the last 72 hours. Thyroid Function Tests: No results for input(s): "TSH", "T4TOTAL", "FREET4", "T3FREE", "THYROIDAB" in the last 72 hours. Anemia Panel: No results for input(s): "VITAMINB12", "FOLATE", "FERRITIN", "TIBC", "IRON", "RETICCTPCT" in the last 72 hours. Sepsis Labs: No results for input(s): "PROCALCITON", "LATICACIDVEN" in the last 168 hours.  Recent Results (from the past 240 hour(s))  Aerobic/Anaerobic Culture w Gram Stain (surgical/deep wound)     Status: None   Collection Time: 06/11/22  2:14 PM   Specimen: Abscess  Result Value Ref Range Status   Specimen Description   Final    ABSCESS Performed at [facility] [address], [place], [place] [address]    Special Requests   Final     ABDOMEN Performed at [facility], [place] [address], [place], [place] [address]    Gram Stain NO WBC SEEN NO ORGANISMS SEEN   Final   Culture   Final    No growth aerobically or anaerobically. Performed at [facility], [place] [address], [place], [place] [address]    Report Status 06/16/2022 FINAL  Final         Radiology Studies: No results found.      Scheduled Meds:  acetaminophen  1,000 mg Oral Q6H   enoxaparin (LOVENOX) injection  40 mg Subcutaneous Q24H   feeding supplement  237 mL Oral BID BM   hydrocortisone cream   Topical QID   methocarbamol  500 mg Oral Q6H   sodium chloride flush  5 mL Intracatheter Q8H   Continuous Infusions:  promethazine (PHENERGAN) injection (IM or IVPB)       LOS: 17 days    Time spent: 35  min  Georgette Shell, MD  06/18/2022, 3:25 PM

## 2022-06-18 NOTE — Progress Notes (Signed)
Patient ID: Morgan Cline, female   DOB: 06-Apr-1971, 51 y.o.   MRN: [id number] [facility] Surgery Progress Note  13 Days Post-Op  Subjective: CC-  Talking with someone on the phone from her insurance. Tolerating diet and colostomy functioning. Does not feel like she can get out of the bed by herself. PT to see again today.  Objective: Vital signs in last 24 hours: Temp:  [98 F (36.7 C)-98.1 F (36.7 C)] 98.1 F (36.7 C) (07/13 0550) Pulse Rate:  [86-110] 86 (07/13 0550) Resp:  [20] 20 (07/13 0550) BP: (108-120)/(76-78) 108/78 (07/13 0550) SpO2:  [95 %-96 %] 96 % (07/13 0550) Last BM Date : 06/17/22  Intake/Output from previous day: 07/12 0701 - 07/13 0700 In: 5  Out: 15 [Drains:15] Intake/Output this shift: No intake/output data recorded.  PE: General appearance: alert and cooperative GI: soft, appropriately tender, ostomy difficult to visualize due to air and stool in bag. Vac to midline with good seal. IR drain serous  Lab Results:  Recent Labs    06/16/22 0425 06/17/22 0433  WBC 11.6* 9.9  HGB 8.6* 8.9*  HCT 27.3* 28.7*  PLT 477* 517*   BMET Recent Labs    06/16/22 0425 06/17/22 0433  NA 139 142  K 3.6 3.5  CL 105 109  CO2 28 27  GLUCOSE 129* 120*  BUN 9 8  CREATININE 0.67 0.78  CALCIUM 8.0* 8.4*   PT/INR No results for input(s): "LABPROT", "INR" in the last 72 hours. CMP     Component Value Date/Time   NA 142 06/17/2022 0433   K 3.5 06/17/2022 0433   CL 109 06/17/2022 0433   CO2 27 06/17/2022 0433   GLUCOSE 120 (H) 06/17/2022 0433   BUN 8 06/17/2022 0433   CREATININE 0.78 06/17/2022 0433   CALCIUM 8.4 (L) 06/17/2022 0433   PROT 6.0 (L) 06/16/2022 0425   ALBUMIN 2.2 (L) 06/16/2022 0425   AST 17 06/16/2022 0425   ALT 20 06/16/2022 0425   ALKPHOS 87 06/16/2022 0425   BILITOT 0.5 06/16/2022 0425   GFRNONAA >60 06/17/2022 0433   GFRAA >90 11/16/2011 1455   Lipase     Component Value Date/Time   LIPASE 23 05/31/2022 2224        Studies/Results: CT ABDOMEN PELVIS W CONTRAST  Result Date: 06/16/2022 CLINICAL DATA:  s/p LUQ fluid collection status post drain insertion on 06/11/2022, follow-up study EXAM: CT ABDOMEN AND PELVIS WITH CONTRAST TECHNIQUE: Multidetector CT imaging of the abdomen and pelvis was performed using the standard protocol following bolus administration of intravenous contrast. RADIATION DOSE REDUCTION: This exam was performed according to the departmental dose-optimization program which includes automated exposure control, adjustment of the mA and/or kV according to patient size and/or use of iterative reconstruction technique. CONTRAST:  139m OMNIPAQUE IOHEXOL 300 MG/ML  SOLN COMPARISON:  June 10, 2022 FINDINGS: Lower chest: There is consolidation at the left lung base with some air bronchograms and has increased in the interim. Small left pleural effusion seen. Hepatobiliary: No focal liver abnormality is seen. No gallstones, gallbladder wall thickening, or biliary dilatation. Pancreas: Unremarkable. No pancreatic ductal dilatation or surrounding inflammatory changes. Spleen: Normal in size without focal abnormality. Adrenals/Urinary Tract: Adrenal glands are unremarkable. Kidneys are normal, without renal calculi, focal lesion, or hydronephrosis. Bladder is unremarkable. Stomach/Bowel: Again seen are the postsurgical distal colectomy with diverting colostomy left lower quadrant changes. No bowel obstruction. There has been interval removal of the enteric tube. There is some contrast seen in the  distal sigmoid and rectum. Vascular/Lymphatic: No significant vascular findings are present. No enlarged abdominal or pelvic lymph nodes. Reproductive: Stable uterine fibroid and IUD. Other: There are multiple loculated fluid pockets. There has been interval drain insertion into the fluid collection at the left paracolic gutter. There is residual fluid collection seen along the left paracolic gutter measuring  approximally 3.2 cm transverse by 10.3 cm craniocaudad. There is loculated fluid collection seen at the right flank measuring 5.3 x 2.3 x 5.1 cm in comparison to 6.5 x 2.2 x 7.2 cm on the previous study. There is some loculated fluid collection seen at the posterior mesentery in the lower abdomen-upper pelvis measuring approximately 4.7 cm transverse x 1.3 cm AP x 4.5 cm craniocaudad without significant interval change. There is some fluid around the fundus of the uterus and has decreased in the interim measuring approximally 1 cm in the AP dimension in comparison to 2.2 cm on the previous study. There is a small fluid collection in the subcutaneous soft tissues of the left anterior abdominal wall just medial to the ostomy tract without significant interval change. Moderate inflammatory stranding at the left mid to lower anterior abdominal wall. Musculoskeletal: No acute or significant osseous findings. IMPRESSION: 1. Stable distal colectomy with a diverting left lower quadrant colostomy changes. 2. There has been interval drain insertion into the fluid collection along the left paracolic gutter. There is residual fluid collection seen at the catheter insertion site measuring approximately 3.2 cm transverse by 10.3 cm craniocaudad. In addition, there are small multiple loculated fluid pockets seen and have decreased in the interim. The fluid pocket at the right upper flank measures 5.3 x 2.3 x 5.1 cm in comparison to 6.5 x 2.2 x 7.2 cm on the previous study. Small loculated fluid collection at the posterior mesentery in the lower abdomen-upper pelvis is without significant interval change. Fluid collection along the fundus of the uterus has decreased in the interim. 3. There is consolidation at the left lung base with some air bronchograms and has increased in the interim. Small left pleural effusion. Electronically Signed   By: Frazier Richards M.D.   On: 06/16/2022 13:28    Anti-infectives: Anti-infectives (From  admission, onward)    Start     Dose/Rate Route Frequency Ordered Stop   06/03/22 1400  piperacillin-tazobactam (ZOSYN) IVPB 3.375 g        3.375 g 12.5 mL/hr over 240 Minutes Intravenous Every 8 hours 06/03/22 0900 06/16/22 1356   06/02/22 0000  cefTRIAXone (ROCEPHIN) 2 g in sodium chloride 0.9 % 100 mL IVPB  Status:  Discontinued        2 g 200 mL/hr over 30 Minutes Intravenous Every 24 hours 06/01/22 1302 06/03/22 0856   06/01/22 1400  metroNIDAZOLE (FLAGYL) IVPB 500 mg  Status:  Discontinued        500 mg 100 mL/hr over 60 Minutes Intravenous Every 12 hours 06/01/22 1302 06/03/22 0856   06/01/22 1200  cefTRIAXone (ROCEPHIN) 2 g in sodium chloride 0.9 % 100 mL IVPB  Status:  Discontinued        2 g 200 mL/hr over 30 Minutes Intravenous Every 24 hours 06/01/22 0432 06/01/22 0801   06/01/22 0900  Ampicillin-Sulbactam (UNASYN) 3 g in sodium chloride 0.9 % 100 mL IVPB  Status:  Discontinued        3 g 200 mL/hr over 30 Minutes Intravenous Every 6 hours 06/01/22 0801 06/01/22 1302   06/01/22 0030  cefTRIAXone (ROCEPHIN) 1 g in sodium  chloride 0.9 % 100 mL IVPB        1 g 200 mL/hr over 30 Minutes Intravenous  Once 06/01/22 0027 06/01/22 0155   06/01/22 0030  metroNIDAZOLE (FLAGYL) IVPB 500 mg  Status:  Discontinued        500 mg 100 mL/hr over 60 Minutes Intravenous Every 12 hours 06/01/22 0027 06/01/22 0801        Assessment/Plan Perforated diverticulitis with multiple abscesses -POD#13 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 Barry Dienes - path consistent with diverticulitis  - abdominal staples removed 7/12. continue vac MWF - tolerating diet and ostomy functioning. Monitor mucocutaneous separation  - WOC following for new ostomy. Outpatient referral to ostomy clinic placed - s/p IR drain 7/6. Culture negative to date. CT 7/11 showed persistent fluid collection with drain in place. Continue drain for now - Insurance did not  approve SNF, patient talking to someone from her insurance today about options. PT to reevaluate - CIR?   ID - zosyn 6/28>>7/11 FEN - soft diet VTE - lovenox Foley - none    LOS: 17 days    Wellington Hampshire, [facility] Surgery 06/18/2022, 10:20 AM Please see Amion for pager number during day hours 7:00am-4:30pm

## 2022-06-18 NOTE — TOC Progression Note (Addendum)
Transition of Care [facility]) - Progression Note    Patient Details  Name: Morgan Cline MRN: [id number] Date of Birth: 05-27-1971  Transition of Care [facility]) CM/SW Contact  Leeroy Cha, RN Phone Number: 06/18/2022, 10:14 AM  Clinical Narrative:    Spoke with patient explained the reason that she can not go to snf-no benefit on her insurance.  Stated that she has spoken to her insurance and can go to inpatient rehab or ltach.  Will both ltachs and cir referrals done. [facility] beds are checking insurance for coverage.  Pt referred to the financial advisor about getting on medicaid due to the expense of ostomy supplies and her lack of monetary resources. Ana leamon to contact patient. [employer] employee case Quentin Mulling at [phone].  Updated on progress she is to e-mail the list of in network hhc providers. 1441/list of providers obtained.  There are no providers on the list that I have not attempted to get to cover hhc. All refused insurance.  Tct-Trye Lambert-informed of progress. Both ltach have made bed offers.  [facility] is checking auth. Expected Discharge Plan: [place] Barriers to Discharge: No [place] will accept this patient  Expected Discharge Plan and Services Expected Discharge Plan: [place]   Discharge Planning Services: CM Consult Post Acute Care Choice: Durable Medical Equipment, Home Health   Expected Discharge Date: 06/17/22               DME Arranged: Negative pressure wound device DME Agency: [employer] Date DME Agency Contacted: 06/15/22 Time DME Agency Contacted: 28 Representative spoke with at DME Agency: tracey barrow HH Arranged: RN   Date [place]: 06/15/22       Social Determinants of Health (English) Interventions    Readmission Risk Interventions    06/01/2022   11:45 AM  Readmission Risk Prevention Plan  Post Dischage Appt Complete   Medication Screening Complete  Transportation Screening Complete

## 2022-06-18 NOTE — TOC Progression Note (Signed)
Transition of Care [facility]) - Progression Note    Patient Details  Name: Morgan Cline MRN: [id number] Date of Birth: August 23, 1971  Transition [facility]) CM/SW Contact  Leeroy Cha, RN Phone Number: 06/18/2022, 3:51 PM  Clinical Narrative:    1552/emailed consent for case management to tyre lambert.   Expected Discharge Plan: [place] Barriers to Discharge: No [place] will accept this patient  Expected Discharge Plan and Services Expected Discharge Plan: [place]   Discharge Planning Services: CM Consult Post Acute Care Choice: Durable Medical Equipment, Home Health   Expected Discharge Date: 06/17/22               DME Arranged: Negative pressure wound device DME Agency: [employer] Date DME Agency Contacted: 06/15/22 Time DME Agency Contacted: 19 Representative spoke with at DME Agency: tracey barrow HH Arranged: RN   Date [place]: 06/15/22       Social Determinants of Health ([place]) Interventions    Readmission Risk Interventions    06/01/2022   11:45 AM  Readmission Risk Prevention Plan  Post Dischage Appt Complete  Medication Screening Complete  Transportation Screening Complete

## 2022-06-19 DIAGNOSIS — K5720 Diverticulitis of large intestine with perforation and abscess without bleeding: Secondary | ICD-10-CM | POA: Diagnosis not present

## 2022-06-19 LAB — GLUCOSE, CAPILLARY
Glucose-Capillary: 113 mg/dL — ABNORMAL HIGH (ref 70–99)
Glucose-Capillary: 115 mg/dL — ABNORMAL HIGH (ref 70–99)
Glucose-Capillary: 143 mg/dL — ABNORMAL HIGH (ref 70–99)

## 2022-06-19 MED ORDER — ALPRAZOLAM 0.5 MG PO TABS
0.5000 mg | ORAL_TABLET | Freq: Two times a day (BID) | ORAL | Status: DC | PRN
  Administered 2022-06-21 – 2022-07-01 (×11): 0.5 mg via ORAL
  Filled 2022-06-19 (×12): qty 1

## 2022-06-19 MED ORDER — ACETAMINOPHEN 500 MG PO TABS
1000.0000 mg | ORAL_TABLET | Freq: Once | ORAL | Status: AC
Start: 2022-06-19 — End: 2022-06-19

## 2022-06-19 MED ORDER — ACETAMINOPHEN 500 MG PO TABS: 1000.0000 mg | ORAL_TABLET | Freq: Once | ORAL | Status: AC

## 2022-06-19 NOTE — Progress Notes (Signed)
Nutrition Education Note  RD consulted for nutrition education regarding low fiber diet. Patient is POD #14 sigmoid colectomy and end colostomy with takedown of splenic flexure.   Hospital course complicated by ileus with need for NGT.  Diet advanced to CLD on 7/6, to FLD on 7/7, and to Soft on 7/8.  Patient shares that she has been tolerating Soft diet well. Today she had outside food brought in: tender baked chicken, mac and cheese, and tender cooked green beans.   Provided patient with Low Fiber (8 grams) Nutrition Therapy handout from the Academy of Nutrition and Dietetics. Reviewed handout with patient and answered all current questions.   Teach back method used.  Expect good compliance.  Body mass index is 40.34 kg/m. Pt meets criteria for obesity based on current BMI.  Current diet order is Soft. Labs and medications reviewed.   No further nutrition interventions warranted at this time. RD contact information provided. If additional nutrition issues arise, please re-consult RD.      Jarome Matin, MS, RD, LDN, [place] Registered Dietitian II Inpatient Clinical Nutrition RD pager # and on-call/weekend pager # available in [facility]

## 2022-06-19 NOTE — Progress Notes (Signed)
Referring Physician(s): Cornett,T  Supervising Physician: Aletta Edouard  Patient Status:  [facility] - In-pt  Chief Complaint: Abdominal pain, fluid collection   Subjective: Pt without new c/o   Allergies: Codeine, Compazine [prochlorperazine], and Pineapple  Medications: Prior to Admission medications   Medication Sig Start Date End Date Taking? Authorizing Provider  amoxicillin-clavulanate (AUGMENTIN) 875-125 MG tablet Take 1 tablet by mouth every 12 (twelve) hours. 05/29/22  Yes Jeanell Sparrow, DO  Aspirin-Salicylamide-Caffeine (BC HEADACHE POWDER PO) Take 1 packet by mouth daily as needed (headaches).   Yes [provider]  diphenhydramine-acetaminophen (TYLENOL PM) 25-500 MG TABS tablet Take 1 tablet by mouth at bedtime as needed (sleep).   Yes [provider]  Fiber [facility] Gummies CHEW Chew 3 tablets by mouth at bedtime.   Yes [provider]  HYDROcodone-acetaminophen (NORCO) 5-325 MG tablet Take 1 tablet by mouth every 6 (six) hours as needed for moderate pain. 05/29/22  Yes Wynona Dove A, DO  ibuprofen (ADVIL) 600 MG tablet Take 1 tablet (600 mg total) by mouth every 6 (six) hours as needed. Patient taking differently: Take 600 mg by mouth every 6 (six) hours as needed (pain). 05/29/22  Yes Jeanell Sparrow, DO  levonorgestrel (MIRENA) 20 MCG/24HR IUD 1 each by Intrauterine route once. Implanted September 2020   Yes [provider]  ondansetron (ZOFRAN) 4 MG tablet Take 1 tablet (4 mg total) by mouth every 4 (four) hours as needed for nausea or vomiting. 05/29/22  Yes Jeanell Sparrow, DO  Semaglutide, 1 MG/DOSE, (OZEMPIC, 1 MG/DOSE,) 4 MG/3ML SOPN Inject 1 mg into the skin every Sunday.   Yes [provider]  acetaminophen (TYLENOL) 500 MG tablet Take 2 tablets (1,000 mg total) by mouth every 6 (six) hours. 06/17/22   Georgette Shell, MD  hydrocortisone cream 0.5 % Apply topically 4 (four) times daily. 06/17/22   Georgette Shell, MD  methocarbamol (ROBAXIN) 500 MG tablet Take 1 tablet (500 mg total) by mouth every 6 (six) hours. 06/17/22   Georgette Shell, MD  oxyCODONE (OXY IR/ROXICODONE) 5 MG immediate release tablet Take 1-2 tablets (5-10 mg total) by mouth every 6 (six) hours as needed for moderate pain, severe pain or breakthrough pain. 06/17/22   Georgette Shell, MD     Vital Signs: BP 111/77 (BP Location: Left Arm)   Pulse 83   Temp 98.1 F (36.7 C) (Oral)   Resp 20   Ht '5\' 4"'$  (1.626 m)   Wt 235 lb (106.6 kg)   LMP 06/09/2022   SpO2 95%   BMI 40.34 kg/m   Physical Exam awake/alert; LUQ drain intact, output 60 cc serous fluid; site not sig tender  Imaging: CT ABDOMEN PELVIS W CONTRAST  Result Date: 06/16/2022 CLINICAL DATA:  s/p LUQ fluid collection status post drain insertion on 06/11/2022, follow-up study EXAM: CT ABDOMEN AND PELVIS WITH CONTRAST TECHNIQUE: Multidetector CT imaging of the abdomen and pelvis was performed using the standard protocol following bolus administration of intravenous contrast. RADIATION DOSE REDUCTION: This exam was performed according to the departmental dose-optimization program which includes automated exposure control, adjustment of the mA and/or kV according to patient size and/or use of iterative reconstruction technique. CONTRAST:  138m OMNIPAQUE IOHEXOL 300 MG/ML  SOLN COMPARISON:  June 10, 2022 FINDINGS: Lower chest: There is consolidation at the left lung base with some air bronchograms and has increased in the interim. Small left pleural effusion seen. Hepatobiliary: No focal liver abnormality is seen.  No gallstones, gallbladder wall thickening, or biliary dilatation. Pancreas: Unremarkable. No pancreatic ductal dilatation or surrounding inflammatory changes. Spleen: Normal in size without focal abnormality. Adrenals/Urinary Tract: Adrenal glands are unremarkable. Kidneys are normal, without renal calculi, focal lesion, or hydronephrosis. Bladder is  unremarkable. Stomach/Bowel: Again seen are the postsurgical distal colectomy with diverting colostomy left lower quadrant changes. No bowel obstruction. There has been interval removal of the enteric tube. There is some contrast seen in the distal sigmoid and rectum. Vascular/Lymphatic: No significant vascular findings are present. No enlarged abdominal or pelvic lymph nodes. Reproductive: Stable uterine fibroid and IUD. Other: There are multiple loculated fluid pockets. There has been interval drain insertion into the fluid collection at the left paracolic gutter. There is residual fluid collection seen along the left paracolic gutter measuring approximally 3.2 cm transverse by 10.3 cm craniocaudad. There is loculated fluid collection seen at the right flank measuring 5.3 x 2.3 x 5.1 cm in comparison to 6.5 x 2.2 x 7.2 cm on the previous study. There is some loculated fluid collection seen at the posterior mesentery in the lower abdomen-upper pelvis measuring approximately 4.7 cm transverse x 1.3 cm AP x 4.5 cm craniocaudad without significant interval change. There is some fluid around the fundus of the uterus and has decreased in the interim measuring approximally 1 cm in the AP dimension in comparison to 2.2 cm on the previous study. There is a small fluid collection in the subcutaneous soft tissues of the left anterior abdominal wall just medial to the ostomy tract without significant interval change. Moderate inflammatory stranding at the left mid to lower anterior abdominal wall. Musculoskeletal: No acute or significant osseous findings. IMPRESSION: 1. Stable distal colectomy with a diverting left lower quadrant colostomy changes. 2. There has been interval drain insertion into the fluid collection along the left paracolic gutter. There is residual fluid collection seen at the catheter insertion site measuring approximately 3.2 cm transverse by 10.3 cm craniocaudad. In addition, there are small multiple  loculated fluid pockets seen and have decreased in the interim. The fluid pocket at the right upper flank measures 5.3 x 2.3 x 5.1 cm in comparison to 6.5 x 2.2 x 7.2 cm on the previous study. Small loculated fluid collection at the posterior mesentery in the lower abdomen-upper pelvis is without significant interval change. Fluid collection along the fundus of the uterus has decreased in the interim. 3. There is consolidation at the left lung base with some air bronchograms and has increased in the interim. Small left pleural effusion. Electronically Signed   By: Frazier Richards M.D.   On: 06/16/2022 13:28    Labs:  CBC: Recent Labs    06/13/22 0450 06/14/22 0400 06/16/22 0425 06/17/22 0433  WBC 13.4* 12.4* 11.6* 9.9  HGB 9.2* 8.9* 8.6* 8.9*  HCT 28.8* 28.6* 27.3* 28.7*  PLT 443* 473* 477* 517*    COAGS: No results for input(s): "INR", "APTT" in the last 8760 hours.  BMP: Recent Labs    06/13/22 0450 06/14/22 0400 06/16/22 0425 06/17/22 0433  NA 139 141 139 142  K 3.7 3.5 3.6 3.5  CL 104 104 105 109  CO2 '28 29 28 27  '$ GLUCOSE 105* 111* 129* 120*  BUN <5* 5* 9 8  CALCIUM 8.2* 8.1* 8.0* 8.4*  CREATININE 0.52 0.76 0.67 0.78  GFRNONAA >60 >60 >60 >60    LIVER FUNCTION TESTS: Recent Labs    06/10/22 0559 06/11/22 0432 06/12/22 0436 06/16/22 0425  BILITOT 0.8 1.0 1.1  0.5  AST 33 '21 15 17  '$ ALT 35 '26 23 20  '$ ALKPHOS 74 72 76 87  PROT 5.8* 5.5* 5.7* 6.0*  ALBUMIN 2.2* 2.2* 2.1* 2.2*    Assessment and Plan: Pt with hx perf diverticulitis with mult abscesses, s/p sigmoid colectomy with end colostomy, takedown of splenic flexure 6/30; s/p drainage of LUQ fluid collection 7/6 (10 fr to JP); afebrile, no new labs; as outpt rec once daily flush of drain with 5 cc sterile saline, output recording and dressing change every 2-3 days; drain clinic f/u has been ordered   Electronically Signed: D. Rowe Robert, PA-C 06/19/2022, 9:38 AM   I spent a total of 15 Minutes at the the  patient's bedside AND on the patient's hospital floor or unit, greater than 50% of which was counseling/coordinating care for abdominal fluid collection drain    Patient ID: Morgan Cline, female   DOB: 1971-05-19, 51 y.o.   MRN: [id number]

## 2022-06-19 NOTE — Consult Note (Signed)
[place] Nurse wound follow up Wound type:Surgical. Seen for NPWT dressing change with [facility] PA-C B. Meuth. Patient received IV dilaudid from Bedside RN, Verdie Mosher just prior to dressing change.  Measurement: 21cm x 5.5cm x 5cm Wound bed:100% red, moist. Small area of bleeding with Granufoam removal at periphery near distal end of incision. Resolves with pressure held X 3 minutes. Drainage (amount, consistency, odor) small serosanguinous Periwound:intact Dressing procedure/placement/frequency: Patient expresses anxiety over dressing change and fear of pain. Deep breathing encouraged and support provided by [facility] PA-C Meuth. Drape removed using adhesive remover spray. Three pieces of black foam removed. Periwound skin prepared using skin barrier strips to creases at 3 and 9 o'clock. Two pieces of black foam inserted into wound to fill dead space, covered with drape. Dressing attached to 111mHg continuous negative pressure and an immediate seal achieved. Patient tolerated procedure well.  Supply ordered for next dressing change. Next dressing change is Monday, July 17.   [facility] ost[facility] follow up Stoma type/location: LLQ colostomy Stomal assessment/size: 1 and 1/4 inch top to bottom and 1 and 5/8 inch from side to side. Pattern enlarged from last change. Stoma is retracted and functioning. Peristomal assessment: Intact Treatment options for stomal/peristomal skin: Skin barrier ring and convex pouch. Output: soft drown stool Ostomy pouching: 1 piece, 2 and 1/8 inch  compressible convex pouch with skin barrier ring. Education provided: None today. Patient states that she can "do" ostomy, that emptying goes better when she it sitting up in chair and not in bed. [place] Yes  Ostomy supplies for next pouch change and discharge supply in room.  [place] team will follow, and will remain available to this patient, the nursing and medical teams.     Thank you for inviting [place] participate in this patient's Plan of Care.  LMaudie Flakes MSN, RN, CNS, [place] CSerita Grammes [employer]rie [employer] FUnisys Corporationphone:  [phone]

## 2022-06-19 NOTE — Progress Notes (Signed)
Patient ID: Morgan Cline, female   DOB: Nov 01, 1971, 51 y.o.   MRN: [id number] [facility] Surgery Progress Note  14 Days Post-Op  Subjective: CC-  Abdomen still sore, especially when she gets OOB and walks. Denies n/v. Tolerating diet and colostomy functioning.  Objective: Vital signs in last 24 hours: Temp:  [98.1 F (36.7 C)-98.3 F (36.8 C)] 98.1 F (36.7 C) (07/14 0516) Pulse Rate:  [83-96] 83 (07/14 0516) Resp:  [20] 20 (07/14 0516) BP: (101-124)/(73-78) 111/77 (07/14 0516) SpO2:  [95 %-97 %] 95 % (07/14 0516) Last BM Date : 06/18/22  Intake/Output from previous day: 07/13 0701 - 07/14 0700 In: 130 [P.O.:120] Out: 835 [Urine:600; Drains:60; Stool:50] Intake/Output this shift: No intake/output data recorded.  PE: General appearance: alert and cooperative GI: soft, appropriately tender, ostomy with some mucocutaneous separation from 3-11 o'clock. Open midline wound with healthy granulation tissue, pictured below. IR drain with serosanguinous fluid in bulb    Lab Results:  Recent Labs    06/17/22 0433  WBC 9.9  HGB 8.9*  HCT 28.7*  PLT 517*   BMET Recent Labs    06/17/22 0433  NA 142  K 3.5  CL 109  CO2 27  GLUCOSE 120*  BUN 8  CREATININE 0.78  CALCIUM 8.4*   PT/INR No results for input(s): "LABPROT", "INR" in the last 72 hours. CMP     Component Value Date/Time   NA 142 06/17/2022 0433   K 3.5 06/17/2022 0433   CL 109 06/17/2022 0433   CO2 27 06/17/2022 0433   GLUCOSE 120 (H) 06/17/2022 0433   BUN 8 06/17/2022 0433   CREATININE 0.78 06/17/2022 0433   CALCIUM 8.4 (L) 06/17/2022 0433   PROT 6.0 (L) 06/16/2022 0425   ALBUMIN 2.2 (L) 06/16/2022 0425   AST 17 06/16/2022 0425   ALT 20 06/16/2022 0425   ALKPHOS 87 06/16/2022 0425   BILITOT 0.5 06/16/2022 0425   GFRNONAA >60 06/17/2022 0433   GFRAA >90 11/16/2011 1455   Lipase     Component Value Date/Time   LIPASE 23 05/31/2022 2224       Studies/Results: No results  found.  Anti-infectives: Anti-infectives (From admission, onward)    Start     Dose/Rate Route Frequency Ordered Stop   06/03/22 1400  piperacillin-tazobactam (ZOSYN) IVPB 3.375 g        3.375 g 12.5 mL/hr over 240 Minutes Intravenous Every 8 hours 06/03/22 0900 06/16/22 1356   06/02/22 0000  cefTRIAXone (ROCEPHIN) 2 g in sodium chloride 0.9 % 100 mL IVPB  Status:  Discontinued        2 g 200 mL/hr over 30 Minutes Intravenous Every 24 hours 06/01/22 1302 06/03/22 0856   06/01/22 1400  metroNIDAZOLE (FLAGYL) IVPB 500 mg  Status:  Discontinued        500 mg 100 mL/hr over 60 Minutes Intravenous Every 12 hours 06/01/22 1302 06/03/22 0856   06/01/22 1200  cefTRIAXone (ROCEPHIN) 2 g in sodium chloride 0.9 % 100 mL IVPB  Status:  Discontinued        2 g 200 mL/hr over 30 Minutes Intravenous Every 24 hours 06/01/22 0432 06/01/22 0801   06/01/22 0900  Ampicillin-Sulbactam (UNASYN) 3 g in sodium chloride 0.9 % 100 mL IVPB  Status:  Discontinued        3 g 200 mL/hr over 30 Minutes Intravenous Every 6 hours 06/01/22 0801 06/01/22 1302   06/01/22 0030  cefTRIAXone (ROCEPHIN) 1 g in sodium chloride 0.9 % 100  mL IVPB        1 g 200 mL/hr over 30 Minutes Intravenous  Once 06/01/22 0027 06/01/22 0155   06/01/22 0030  metroNIDAZOLE (FLAGYL) IVPB 500 mg  Status:  Discontinued        500 mg 100 mL/hr over 60 Minutes Intravenous Every 12 hours 06/01/22 0027 06/01/22 0801        Assessment/Plan Perforated diverticulitis with multiple abscesses -POD#14 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 Barry Dienes - path consistent with diverticulitis  - abdominal staples removed 7/12. continue vac MWF - tolerating diet and ostomy functioning. Monitor mucocutaneous separation  - WOC following for new ostomy. Outpatient referral to ostomy clinic placed - s/p IR drain 7/6. Culture negative but repeat CT 7/11 showed persistent fluid collection with drain in  place. Continue drain for now, outpatient follow up with IR - Insurance did not approve SNF or CIR. Patient waiting to hear back from her insurance contact person but would like to go to [facility] if this gets approved. If not she does not have any help at home and does not feel comfortable going home (and also insurance would not cover home health nurse, but she needs wound vac upon discharge)   ID - zosyn 6/28>>7/11 FEN - soft diet VTE - lovenox Foley - none    LOS: 18 days    Wellington Hampshire, [facility] Surgery 06/19/2022, 9:35 AM Please see Amion for pager number during day hours 7:00am-4:30pm

## 2022-06-19 NOTE — Progress Notes (Signed)
PROGRESS NOTE    Preslea Rhodus Tuite  NID:[id number] DOB: [birth year] DOA: 05/31/2022 PCP: [facility]   Brief Narrative: 51 year old female with history of prediabetes, obesity, comes to the hospital with abdominal pain.  She was recently seen in the ED couple days prior to admission with sigmoid diverticulitis, she was given Augmentin and sent home, however returned due to worsening pain.  A CT scan showed worsening perforated diverticulitis with abscess formation.  General surgery was consulted and eventually underwent Hartman's procedure on 6/30.  Assessment & Plan:   Principal Problem:   Perforation of sigmoid colon due to diverticulitis Active Problems:   Obesity, Class III, BMI 40-49.9 (morbid obesity) (HCC)   Prediabetes   Hyponatremia   Hypokalemia   Bandemia   Normocytic anemia    Sigmoid colon perforation due to diverticulitis, multiple abscesses, postop ileus-appreciate general surgery follow-up.  She is status post sigmoid colectomy and end colostomy, takedown of splenic flexure by Dr. Barry Dienes on 6/30.  Postop course complicated by ileus, NG tube, now resolved, NG tube was removed in the interval and she is eating well.  She had persistent WBC elevation and underwent a CT scan of the abdomen and pelvis 7/5 which showed a left-sided fluid collection.  She is status post IR guided drainage on 7/6.  WBC continues to improve, cultures without growth.  Completed Zosyn 06/16/2022 Still waiting on safe disposition denied by CIR and SNF   Active problems  Hyponatremia-resolved Hypokalemia -resolved Thrombocytosis -likely reactive monitor Normocytic Anemia -no bleeding, monitor   Pre-Diabetes-CBGs with good control on Ozempic prior to admission    Estimated body mass index is 40.34 kg/m as calculated from the following:   Height as of this encounter: '5\' 4"'$  (1.626 m).   Weight as of this encounter: 106.6 kg.  DVT prophylaxis: Lovenox  code Status: Full code Family  Communication: None Disposition Plan:  Status is: Inpatient Remains inpatient appropriate because: Await safe DC plan   Consultants:  General surgery  Subjective:  She feels she will not be able to go home and take care of her wound she is requesting something for anxiety prior to changing wound VAC Pictures of her surgical wound noted  Objective: Vitals:   06/18/22 1235 06/18/22 2037 06/19/22 0516 06/19/22 1301  BP: 124/78 101/73 111/77 128/76  Pulse: 96 95 83 93  Resp:  20 20   Temp: 98.3 F (36.8 C) 98.2 F (36.8 C) 98.1 F (36.7 C) 98.1 F (36.7 C)  TempSrc: Oral Oral Oral Oral  SpO2: 97% 96% 95% 98%  Weight:      Height:        Intake/Output Summary (Last 24 hours) at 06/19/2022 1526 Last data filed at 06/19/2022 1423 Gross per 24 hour  Intake 490 ml  Output 1220 ml  Net -730 ml    Filed Weights   05/31/22 2156 06/01/22 0410 06/05/22 1154  Weight: 106.6 kg 115.3 kg 106.6 kg    Examination:  General exam: Appears in no acute distress respiratory system: Clear to auscultation. Respiratory effort normal. Cardiovascular system: S1 & S2 heard, RRR. No JVD, murmurs, rubs, gallops or clicks. No pedal edema. Gastrointestinal system: Abdomen is nondistended, soft and nontender. No organomegaly or masses felt. Normal bowel sounds heard.  Wound VAC in place, drain in place Central nervous system: Alert and oriented. No focal neurological deficits. Extremities: Symmetric 5 x 5 power. Skin: No rashes, lesions or ulcers Psychiatry: Judgement and insight appear normal. Mood & affect appropriate.  Data Reviewed: I have personally reviewed following labs and imaging studies  CBC: Recent Labs  Lab 06/13/22 0450 06/14/22 0400 06/16/22 0425 06/17/22 0433  WBC 13.4* 12.4* 11.6* 9.9  HGB 9.2* 8.9* 8.6* 8.9*  HCT 28.8* 28.6* 27.3* 28.7*  MCV 91.1 91.1 90.7 91.1  PLT 443* 473* 477* 517*    Basic Metabolic Panel: Recent Labs  Lab 06/13/22 0450 06/14/22 0400  06/16/22 0425 06/17/22 0433  NA 139 141 139 142  K 3.7 3.5 3.6 3.5  CL 104 104 105 109  CO2 '28 29 28 27  '$ GLUCOSE 105* 111* 129* 120*  BUN <5* 5* 9 8  CREATININE 0.52 0.76 0.67 0.78  CALCIUM 8.2* 8.1* 8.0* 8.4*    GFR: Estimated Creatinine Clearance: 102.5 mL/min (by C-G formula based on SCr of 0.78 mg/dL). Liver Function Tests: Recent Labs  Lab 06/16/22 0425  AST 17  ALT 20  ALKPHOS 87  BILITOT 0.5  PROT 6.0*  ALBUMIN 2.2*    No results for input(s): "LIPASE", "AMYLASE" in the last 168 hours. No results for input(s): "AMMONIA" in the last 168 hours. Coagulation Profile: No results for input(s): "INR", "PROTIME" in the last 168 hours. Cardiac Enzymes: No results for input(s): "CKTOTAL", "CKMB", "CKMBINDEX", "TROPONINI" in the last 168 hours. BNP (last 3 results) No results for input(s): "PROBNP" in the last 8760 hours. HbA1C: No results for input(s): "HGBA1C" in the last 72 hours. CBG: Recent Labs  Lab 06/18/22 0005 06/18/22 0725 06/18/22 1600 06/19/22 0012 06/19/22 0801  GLUCAP 132* 115* 122* 143* 113*    Lipid Profile: No results for input(s): "CHOL", "HDL", "LDLCALC", "TRIG", "CHOLHDL", "LDLDIRECT" in the last 72 hours. Thyroid Function Tests: No results for input(s): "TSH", "T4TOTAL", "FREET4", "T3FREE", "THYROIDAB" in the last 72 hours. Anemia Panel: No results for input(s): "VITAMINB12", "FOLATE", "FERRITIN", "TIBC", "IRON", "RETICCTPCT" in the last 72 hours. Sepsis Labs: No results for input(s): "PROCALCITON", "LATICACIDVEN" in the last 168 hours.  Recent Results (from the past 240 hour(s))  Aerobic/Anaerobic Culture w Gram Stain (surgical/deep wound)     Status: None   Collection Time: 06/11/22  2:14 PM   Specimen: Abscess  Result Value Ref Range Status   Specimen Description   Final    ABSCESS Performed at [facility] [address] [place], [place] [address]    Special Requests   Final    ABDOMEN Performed at [facility], [place] [address], [place], [place] [address]    Gram Stain NO WBC SEEN NO ORGANISMS SEEN   Final   Culture   Final    No growth aerobically or anaerobically. Performed at [facility], [place] [address], [place], [place] [address]    Report Status 06/16/2022 FINAL  Final         Radiology Studies: No results found.      Scheduled Meds:  acetaminophen  1,000 mg Oral Q6H   enoxaparin (LOVENOX) injection  40 mg Subcutaneous Q24H   feeding supplement  237 mL Oral BID BM   hydrocortisone cream   Topical QID   methocarbamol  500 mg Oral Q6H   sodium chloride flush  5 mL Intracatheter Q8H   Continuous Infusions:  promethazine (PHENERGAN) injection (IM or IVPB)       LOS: 18 days    Time spent: 35  min  Georgette Shell, MD 06/19/2022, 3:26 PM

## 2022-06-19 NOTE — TOC Progression Note (Signed)
Transition of Care [facility]) - Progression Note    Patient Details  Name: Morgan Cline MRN: [id number] Date of Birth: Nov 21, 1971  Transition of Care [facility] CM/SW Contact  Servando Snare, [place] Phone Number: 06/19/2022, 11:10 AM  Clinical Narrative:   TOC followed up with CIR referral. Note was put in yesterday that CIR signed off due to no PT follow up. Addend PT notes states CIR recommendation. CIR requesting OT consult and another PT eval for patient. Attending notified.     Expected Discharge Plan: [place] Barriers to Discharge: No [facility] will accept this patient  Expected Discharge Plan and Services Expected Discharge Plan: [place]   Discharge Planning Services: CM Consult Post Acute Care Choice: Durable Medical Equipment, Home Health   Expected Discharge Date: 06/17/22               DME Arranged: Negative pressure wound device DME Agency: [employer] Date DME Agency Contacted: 06/15/22 Time DME Agency Contacted: 41 Representative spoke with at DME Agency: tracey barrow HH Arranged: RN   Date Ginger Blue: 06/15/22       Social Determinants of Health ([place]) Interventions    Readmission Risk Interventions    06/01/2022   11:45 AM  Readmission Risk Prevention Plan  Post Dischage Appt Complete  Medication Screening Complete  Transportation Screening Complete

## 2022-06-19 NOTE — Progress Notes (Signed)
   06/19/22 1902  Assess: MEWS Score  Temp 98.1 F (36.7 C)  BP 107/76  MAP (mmHg) 82  Pulse Rate (!) 105  Resp (!) 22  Level of Consciousness Alert  SpO2 96 %  O2 Device Room Air  Assess: MEWS Score  MEWS Temp 0  MEWS Systolic 0  MEWS Pulse 1  MEWS RR 1  MEWS LOC 0  MEWS Score 2  MEWS Score Color Yellow  Treat  Pain Scale 0-10  Pain Score 0  Take Vital Signs  Increase Vital Sign Frequency  Yellow: Q 2hr X 2 then Q 4hr X 2, if remains yellow, continue Q 4hrs  Escalate  MEWS: Escalate Yellow: discuss with charge nurse/RN and consider discussing with provider and RRT  Notify: Charge Nurse/RN  Name of Charge Nurse/RN Notified Sharyn Blitz, RN  Date Charge Nurse/RN Notified 06/19/22  Time Charge Nurse/RN Notified 1906  Notify: Provider  Provider Name/Title Landis Gandy, RN  Date Provider Notified 06/19/22  Time Provider Notified 1906  Method of Notification  (secure chat)  Notification Reason Change in status  Assess: SIRS CRITERIA  SIRS Temperature  0  SIRS Pulse 1  SIRS Respirations  1  SIRS WBC 0  SIRS Score Sum  2   Patient denies pain, discomfort, nausea.  Angie Fava, RN

## 2022-06-19 NOTE — Progress Notes (Signed)
Physical Therapy Treatment Patient Details Name: Morgan Cline MRN: [id number] DOB: May 15, 1971 Today's Date: 06/19/2022   History of Present Illness 51 year old female with PMH of morbid obesity, prediabetes and migraine headache returning with worsening abdominal pain due to acute sigmoid diverticulitis despite oral antibiotics.  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    PT Comments    Pt tolerated increased ambulation distance of 30' with RW. All mobility is slow and labored due to severe abdominal pain. Pt puts forth good effort.    Recommendations for follow up therapy are one component of a multi-disciplinary discharge planning process, led by the attending physician.  Recommendations may be updated based on patient status, additional functional criteria and insurance authorization.  Follow Up Recommendations  Acute inpatient rehab (3hours/day) (difficult to plan for DC as pt has been denied for SNF and CIR)     Assistance Recommended at Discharge Intermittent Supervision/Assistance  Patient can return home with the following A little help with walking and/or transfers;A little help with bathing/dressing/bathroom;Help with stairs or ramp for entrance;Assistance with cooking/housework;Assist for transportation   Equipment Recommendations  None recommended by PT    Recommendations for Other Services       Precautions / Restrictions Precautions Precautions: Fall Precaution Comments: colostomy, wound VAC, JP drain on L Restrictions Weight Bearing Restrictions: No     Mobility  Bed Mobility Overal bed mobility: Needs Assistance Bed Mobility: Rolling, Sidelying to Sit Rolling: Min assist Sidelying to sit: Mod assist       General bed mobility comments: VCs for log roll technique, min A to initiate roll, Mod A to raise trunk, used pad to pivot hips to EOB    Transfers Overall transfer level: Needs  assistance Equipment used: Rolling walker (2 wheels) Transfers: Sit to/from Stand Sit to Stand: Min guard           General transfer comment: increased time, VCs for technique, min/guard A to power up    Ambulation/Gait Ambulation/Gait assistance: Min guard Gait Distance (Feet): 30 Feet Assistive device: Rolling walker (2 wheels) Gait Pattern/deviations: Step-through pattern, Decreased stride length, Trunk flexed Gait velocity: decreased     General Gait Details: slow but functional gait increased complaints of ABD pain with all activity, VCs for posture and for relaxation breathing   Stairs             Wheelchair Mobility    Modified Rankin (Stroke Patients Only)       Balance Overall balance assessment: Needs assistance Sitting-balance support: Feet supported Sitting balance-Leahy Scale: Fair     Standing balance support: Bilateral upper extremity supported, During functional activity Standing balance-Leahy Scale: Fair Standing balance comment: able to stand statically without UE support                            Cognition Arousal/Alertness: Awake/alert Behavior During Therapy: WFL for tasks assessed/performed Overall Cognitive Status: Within Functional Limits for tasks assessed                                 General Comments: very pleasant, puts forth good effort        Exercises      General Comments        Pertinent Vitals/Pain Pain Assessment Pain Score: 10-Worst pain ever Pain Location: abdomen Pain Descriptors / Indicators: Aching, Grimacing, Operative  site guarding, Sore, Stabbing Pain Intervention(s): Monitored during session, Limited activity within patient's tolerance, Premedicated before session, Patient requesting pain meds-RN notified, Relaxation (used music as a distraction (pt likes Rana Snare))    Home Living                          Prior Function            PT Goals (current  goals can now be found in the care plan section) Acute Rehab PT Goals Patient Stated Goal: less pain and improve mobility PT Goal Formulation: With patient Time For Goal Achievement: 06/20/22 Potential to Achieve Goals: Good Progress towards PT goals: Progressing toward goals    Frequency    Min 3X/week      PT Plan Current plan remains appropriate    Co-evaluation              AM-PAC PT "6 Clicks" Mobility   Outcome Measure  Help needed turning from your back to your side while in a flat bed without using bedrails?: A Little Help needed moving from lying on your back to sitting on the side of a flat bed without using bedrails?: A Lot Help needed moving to and from a bed to a chair (including a wheelchair)?: A Little Help needed standing up from a chair using your arms (e.g., wheelchair or bedside chair)?: A Little Help needed to walk in hospital room?: A Little Help needed climbing 3-5 steps with a railing? : A Lot 6 Click Score: 16    End of Session   Activity Tolerance: Patient limited by pain Patient left: with call bell/phone within reach Nurse Communication: Mobility status PT Visit Diagnosis: Other abnormalities of gait and mobility (R26.89);Pain     Time: 1334-1400 PT Time Calculation (min) (ACUTE ONLY): 26 min  Charges:  $Gait Training: 8-22 mins $Therapeutic Activity: 8-22 mins                     Blondell Reveal Kistler PT 06/19/2022  Acute Rehabilitation Services  Office [phone]

## 2022-06-19 NOTE — Evaluation (Signed)
Occupational Therapy Evaluation Patient Details Name: Morgan Cline MRN: [id number] DOB: 04-Jun-1971 Today's Date: 06/19/2022   History of Present Illness 51 year old female with PMH of morbid obesity, prediabetes and migraine headache returning with worsening abdominal pain due to acute sigmoid diverticulitis despite oral antibiotics.  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   Clinical Impression   Morgan Cline is a 51 year old woman who presents with above medical history with JP drain, incisional wound vac and pain. 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 On evaluation she presents with pain, decreased activity tolerance, impaired balance and generalized weakness. She requires increased assistance with ADLs and a seated position predominantly. Her mobility is limited and mostly transferring to [facility]. She is reliant on a walker. Patient will benefit from skilled OT services while in hospital to improve deficits and learn compensatory strategies as needed in order to return to PLOF. Patient motivated and working at a physical job prior to hospitalization. Recommend aggressive short term rehab at discharge to maximize physical abilities and functional independence prior to return home.      Recommendations for follow up therapy are one component of a multi-disciplinary discharge planning process, led by the attending physician.  Recommendations may be updated based on patient status, additional functional criteria and insurance authorization.   Follow Up Recommendations  Acute inpatient rehab (3hours/day)    Assistance Recommended at Discharge Frequent or constant Supervision/Assistance  Patient can return home with the following A little help with walking and/or transfers;A lot of help with bathing/dressing/bathroom;Assistance with cooking/housework;Help with stairs or ramp for  entrance    Functional Status Assessment  Patient has had a recent decline in their functional status and demonstrates the ability to make significant improvements in function in a reasonable and predictable amount of time.  Equipment Recommendations  BSC/3in1 (bari-BSC)    Recommendations for Other Services       Precautions / Restrictions Precautions Precautions: Fall Precaution Comments: colostomy, wound VAC, JP drain on L Restrictions Weight Bearing Restrictions: No      Mobility Bed Mobility               General bed mobility comments: up in chair    Transfers Overall transfer level: Needs assistance Equipment used: Rolling walker (2 wheels) Transfers: Sit to/from Stand Sit to Stand: Min guard                  Balance Overall balance assessment: Needs assistance Sitting-balance support: No upper extremity supported, Feet supported Sitting balance-Leahy Scale: Fair     Standing balance support: Reliant on assistive device for balance Standing balance-Leahy Scale: Fair                             ADL either performed or assessed with clinical judgement   ADL Overall ADL's : Needs assistance/impaired Eating/Feeding: Independent   Grooming: Set up;Sitting   Upper Body Bathing: Moderate assistance;Sitting Upper Body Bathing Details (indicate cue type and reason): needs assistance to clean underneath folds due to pain and incisional wound vac Lower Body Bathing: Maximal assistance Lower Body Bathing Details (indicate cue type and reason): need assistance with lower legs, perianal area, buttocks, back of legs - patient able to wash periarea in front Upper Body Dressing : Set up;Sitting   Lower Body Dressing: Maximal assistance;Sit to/from stand   Toilet  Transfer: Software engineer (2 wheels)   Toileting- Clothing Manipulation and Hygiene: Moderate assistance;Sit to/from stand       Functional mobility  during ADLs: Min guard;Rolling walker (2 wheels) General ADL Comments: Sit to stand was min guard. Movement very slow and guarded due to pain.     Vision Patient Visual Report: No change from baseline       Perception     Praxis      Pertinent Vitals/Pain Pain Assessment Pain Assessment: 0-10 Pain Score: 7  Pain Location: abdomen - with standing Pain Descriptors / Indicators: Aching, Grimacing, Operative site guarding, Stabbing, Sharp Pain Intervention(s): Monitored during session, Premedicated before session     Hand Dominance Right   Extremity/Trunk Assessment Upper Extremity Assessment Upper Extremity Assessment: Overall WFL for tasks assessed   Lower Extremity Assessment Lower Extremity Assessment: Defer to PT evaluation   Cervical / Trunk Assessment Cervical / Trunk Assessment:  (body habitus)   Communication Communication Communication: No difficulties   Cognition Arousal/Alertness: Awake/alert Behavior During Therapy: WFL for tasks assessed/performed Overall Cognitive Status: Within Functional Limits for tasks assessed                                       General Comments       Exercises     Shoulder Instructions      Home Living Family/patient expects to be discharged to:: Private residence Living Arrangements: Children (son lives with her, he works)   Type of Home: House Home Access: Stairs to enter Technical brewer of Steps: 3   Home Layout: One level     Bathroom Shower/Tub: Occupational psychologist: Standard     Home Equipment: Conservation officer, nature (2 wheels);Shower seat   Additional Comments: 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 prior to [employer]      Prior Functioning/Environment Prior Level of Function : Independent/Modified Independent                        OT Problem List: Decreased activity tolerance;Decreased knowledge of use of DME or AE;Pain;Obesity;Decreased knowledge of  precautions;Increased edema;Decreased strength;Impaired balance (sitting and/or standing)      OT Treatment/Interventions: Self-care/ADL training;Therapeutic exercise;DME and/or AE instruction;Therapeutic activities;Balance training;Patient/family education    OT Goals(Current goals can be found in the care plan section) Acute Rehab OT Goals Patient Stated Goal: do more OT Goal Formulation: With patient Time For Goal Achievement: 07/03/22 Potential to Achieve Goals: Good  OT Frequency: Min 2X/week    Co-evaluation              AM-PAC OT "6 Clicks" Daily Activity     Outcome Measure Help from another person eating meals?: None Help from another person taking care of personal grooming?: A Little Help from another person toileting, which includes using toliet, bedpan, or urinal?: A Lot Help from another person bathing (including washing, rinsing, drying)?: A Lot Help from another person to put on and taking off regular upper body clothing?: A Little Help from another person to put on and taking off regular lower body clothing?: A Lot 6 Click Score: 16   End of Session Equipment Utilized During Treatment: Rolling walker (2 wheels) Nurse Communication: Mobility status  Activity Tolerance: Patient limited by pain Patient left: in chair;with call bell/phone within reach;with family/visitor present  OT Visit Diagnosis: Pain  Time: 2524-7998 OT Time Calculation (min): 22 min Charges:  OT General Charges $OT Visit: 1 Visit OT Evaluation $OT Eval Low Complexity: 1 Low  Tynesha Free, OTR/L [place]  Office [phone] Pager: Miranda 06/19/2022, 4:04 PM

## 2022-06-20 ENCOUNTER — Inpatient Hospital Stay (HOSPITAL_COMMUNITY): Payer: Federal, State, Local not specified - PPO

## 2022-06-20 DIAGNOSIS — K5720 Diverticulitis of large intestine with perforation and abscess without bleeding: Secondary | ICD-10-CM | POA: Diagnosis not present

## 2022-06-20 DIAGNOSIS — R9431 Abnormal electrocardiogram [ECG] [EKG]: Secondary | ICD-10-CM | POA: Diagnosis not present

## 2022-06-20 LAB — COMPREHENSIVE METABOLIC PANEL
ALT: 24 U/L (ref 0–44)
AST: 22 U/L (ref 15–41)
Albumin: 2.4 g/dL — ABNORMAL LOW (ref 3.5–5.0)
Alkaline Phosphatase: 127 U/L — ABNORMAL HIGH (ref 38–126)
Anion gap: 6 (ref 5–15)
BUN: 12 mg/dL (ref 6–20)
CO2: 25 mmol/L (ref 22–32)
Calcium: 8.8 mg/dL — ABNORMAL LOW (ref 8.9–10.3)
Chloride: 109 mmol/L (ref 98–111)
Creatinine, Ser: 0.55 mg/dL (ref 0.44–1.00)
GFR, Estimated: >60 mL/min (ref >60)
Glucose, Bld: 131 mg/dL — ABNORMAL HIGH (ref 70–99)
Potassium: 3.8 mmol/L (ref 3.5–5.1)
Sodium: 140 mmol/L (ref 135–145)
Total Bilirubin: 0.4 mg/dL (ref 0.3–1.2)
Total Protein: 7.2 g/dL (ref 6.5–8.1)

## 2022-06-20 LAB — CBC
HCT: 30.5 % — ABNORMAL LOW (ref 36.0–46.0)
Hemoglobin: 9.6 g/dL — ABNORMAL LOW (ref 12.0–15.0)
MCH: 28.1 pg (ref 26.0–34.0)
MCHC: 31.5 g/dL (ref 30.0–36.0)
MCV: 89.2 fL (ref 80.0–100.0)
Platelets: 477 10*3/uL — ABNORMAL HIGH (ref 150–400)
RBC: 3.42 MIL/uL — ABNORMAL LOW (ref 3.87–5.11)
RDW: 15 % (ref 11.5–15.5)
WBC: 8.1 10*3/uL (ref 4.0–10.5)
nRBC: 0 % (ref 0.0–0.2)

## 2022-06-20 LAB — ECHOCARDIOGRAM COMPLETE
AR max vel: 1.76 cm2
AV Area VTI: 1.9 cm2
AV Area mean vel: 1.82 cm2
AV Mean grad: 7.8 mmHg
AV Peak grad: 17.2 mmHg
Ao pk vel: 2.07 m/s
Area-P 1/2: 4.68 cm2
Height: 64 in
S' Lateral: 3.3 cm
Weight: 3760 oz

## 2022-06-20 LAB — GLUCOSE, CAPILLARY
Glucose-Capillary: 115 mg/dL — ABNORMAL HIGH (ref 70–99)
Glucose-Capillary: 116 mg/dL — ABNORMAL HIGH (ref 70–99)
Glucose-Capillary: 119 mg/dL — ABNORMAL HIGH (ref 70–99)
Glucose-Capillary: 141 mg/dL — ABNORMAL HIGH (ref 70–99)

## 2022-06-20 MED ORDER — METOPROLOL SUCCINATE ER 25 MG PO TB24
12.5000 mg | ORAL_TABLET | Freq: Every day | ORAL | Status: DC
  Administered 2022-06-20 – 2022-07-01 (×12): 12.5 mg via ORAL
  Filled 2022-06-20 (×12): qty 1

## 2022-06-20 MED ORDER — PERFLUTREN LIPID MICROSPHERE
1.0000 mL | INTRAVENOUS | Status: AC | PRN
  Administered 2022-06-20: 6 mL via INTRAVENOUS
  Filled 2022-06-20: qty 10

## 2022-06-20 NOTE — Progress Notes (Signed)
Inpatient Rehab Admissions:  Inpatient Rehab Consult received.  I spoke with patient and uncle Deroy on the telephone for rehabilitation assessment and to discuss goals and expectations of an inpatient rehab admission.  Both acknowledged understanding of CIR goals and expectations. Pt interested in pursuing CIR. Her uncle is supportive. Pt and Deroy confirmed that pt will discharge to uncle's house where he and other family will be able to provide 24/7 support for pt. Will continue to follow.  Signed: Gayland Curry, Junior, [place] Admissions Coordinator [phone]

## 2022-06-20 NOTE — Progress Notes (Addendum)
PROGRESS NOTE    Midori Dado Hainer  DSK:[id number] DOB: March 26, 1971 DOA: 05/31/2022 [facility]CP: [facility]   Brief Narrative: 51 year old female with history of prediabetes, obesity, comes to the hospital with abdominal pain.  She was recently seen in the ED couple days prior to admission with sigmoid diverticulitis, she was given Augmentin and sent home, however returned due to worsening pain.  A CT scan showed worsening perforated diverticulitis with abscess formation.  General surgery was consulted and eventually underwent Hartman's procedure on 6/30.  Assessment & Plan:   Principal Problem:   Perforation of sigmoid colon due to diverticulitis Active Problems:   Obesity, Class III, BMI 40-49.9 (morbid obesity) (HCC)   Prediabetes   Hyponatremia   Hypokalemia   Bandemia   Normocytic anemia    Sigmoid colon perforation due to diverticulitis, multiple abscesses, postop ileus-appreciate general surgery follow-up.  She is status post sigmoid colectomy and end colostomy, takedown of splenic flexure by Dr. Barry Dienes on 6/30.  Postop course complicated by ileus, NG tube, now resolved, NG tube was removed in the interval and she is eating well.  She had persistent WBC elevation and underwent a CT scan of the abdomen and pelvis 7/5 which showed a left-sided fluid collection.  She is status post IR guided drainage on 7/6.  WBC continues to improve, cultures without growth.  Completed Zosyn 06/16/2022 Still waiting on safe disposition denied by CIR and SNF   Active problems  Hyponatremia-resolved Hypokalemia -resolved Thrombocytosis -likely reactive monitor Normocytic Anemia -no bleeding, monitor   Pre-Diabetes-CBGs with good control on Ozempic prior to admission CBG (last 3)  Recent Labs    06/19/22 1610 06/20/22 0004 06/20/22 0731  GLUCAP 115* 119* 116*   Tachycardia-ekg Chest xray Echo Low dose metoprolol     Estimated body mass index is 40.34 kg/m as calculated from the  following:   Height as of this encounter: '5\' 4"'$  (1.626 m).   Weight as of this encounter: 106.6 kg.  DVT prophylaxis: Lovenox  code Status: Full code Family Communication: None Disposition Plan:  Status is: Inpatient Remains inpatient appropriate because: Await safe DC plan   Consultants:  General surgery  Subjective: She has been starting to be a little tachycardic even without pain since yesterday.  She does complain of some shortness of breath with exertion. No chest pain  Objective: Vitals:   06/19/22 1301 06/19/22 1902 06/19/22 2050 06/20/22 0552  BP: 128/76 107/76 96/64 107/76  Pulse: 93 (!) 105 97 89  Resp:  (!) '22 20 20  '$ Temp: 98.1 F (36.7 C) 98.1 F (36.7 C) 98.2 F (36.8 C) 98.4 F (36.9 C)  TempSrc: Oral Oral Oral Oral  SpO2: 98% 96% 96% 96%  Weight:      Height:        Intake/Output Summary (Last 24 hours) at 06/20/2022 1231 Last data filed at 06/20/2022 0919 Gross per 24 hour  Intake 930 ml  Output 995 ml  Net -65 ml    Filed Weights   05/31/22 2156 06/01/22 0410 06/05/22 1154  Weight: 106.6 kg 115.3 kg 106.6 kg    Examination:on RA   General exam: Appears in no acute distress  respiratory system: diminished  to auscultation. Respiratory effort normal. Cardiovascular system: S1 & S2 heard, RRR. No JVD, murmurs, rubs, gallops or clicks. No pedal edema. Gastrointestinal system: Abdomen is nondistended, soft and nontender. No organomegaly or masses felt. Normal bowel sounds heard.  Wound VAC in place, drain in place [place]  nervous system: Alert and oriented. No focal neurological deficits. Extremities: TRACE EDEMA  Skin: No rashes, lesions or ulcers Psychiatry: Judgement and insight appear normal. Mood & affect appropriate.     Data Reviewed: I have personally reviewed following labs and imaging studies  CBC: Recent Labs  Lab 06/14/22 0400 06/16/22 0425 06/17/22 0433  WBC 12.4* 11.6* 9.9  HGB 8.9* 8.6* 8.9*  HCT  28.6* 27.3* 28.7*  MCV 91.1 90.7 91.1  PLT 473* 477* 517*    Basic Metabolic Panel: Recent Labs  Lab 06/14/22 0400 06/16/22 0425 06/17/22 0433  NA 141 139 142  K 3.5 3.6 3.5  CL 104 105 109  CO2 '29 28 27  '$ GLUCOSE 111* 129* 120*  BUN 5* 9 8  CREATININE 0.76 0.67 0.78  CALCIUM 8.1* 8.0* 8.4*    GFR: Estimated Creatinine Clearance: 102.5 mL/min (by C-G formula based on SCr of 0.78 mg/dL). Liver Function Tests: Recent Labs  Lab 06/16/22 0425  AST 17  ALT 20  ALKPHOS 87  BILITOT 0.5  PROT 6.0*  ALBUMIN 2.2*    No results for input(s): "LIPASE", "AMYLASE" in the last 168 hours. No results for input(s): "AMMONIA" in the last 168 hours. Coagulation Profile: No results for input(s): "INR", "PROTIME" in the last 168 hours. Cardiac Enzymes: No results for input(s): "CKTOTAL", "CKMB", "CKMBINDEX", "TROPONINI" in the last 168 hours. BNP (last 3 results) No results for input(s): "PROBNP" in the last 8760 hours. HbA1C: No results for input(s): "HGBA1C" in the last 72 hours. CBG: Recent Labs  Lab 06/19/22 0012 06/19/22 0801 06/19/22 1610 06/20/22 0004 06/20/22 0731  GLUCAP 143* 113* 115* 119* 116*    Lipid Profile: No results for input(s): "CHOL", "HDL", "LDLCALC", "TRIG", "CHOLHDL", "LDLDIRECT" in the last 72 hours. Thyroid Function Tests: No results for input(s): "TSH", "T4TOTAL", "FREET4", "T3FREE", "THYROIDAB" in the last 72 hours. Anemia Panel: No results for input(s): "VITAMINB12", "FOLATE", "FERRITIN", "TIBC", "IRON", "RETICCTPCT" in the last 72 hours. Sepsis Labs: No results for input(s): "PROCALCITON", "LATICACIDVEN" in the last 168 hours.  Recent Results (from the past 240 hour(s))  Aerobic/Anaerobic Culture w Gram Stain (surgical/deep wound)     Status: None   Collection Time: 06/11/22  2:14 PM   Specimen: Abscess  Result Value Ref Range Status   Specimen Description   Final    ABSCESS Performed at [place] [address] [place], [place] [address]    Special Requests   Final    ABDOMEN Performed at [facility], [place] [address] [place], [place] [address]    Gram Stain NO WBC SEEN NO ORGANISMS SEEN   Final   Culture   Final    No growth aerobically or anaerobically. Performed at [facility], [place] [address], [place], [place] [address]    Report Status 06/16/2022 FINAL  Final         Radiology Studies: No results found.      Scheduled Meds:  acetaminophen  1,000 mg Oral Q6H   enoxaparin (LOVENOX) injection  40 mg Subcutaneous Q24H   feeding supplement  237 mL Oral BID BM   hydrocortisone cream   Topical QID   methocarbamol  500 mg Oral Q6H   sodium chloride flush  5 mL Intracatheter Q8H   Continuous Infusions:  promethazine (PHENERGAN) injection (IM or IVPB)       LOS: 19 days    Time spent: 35  min  Georgette Shell, MD 06/20/2022, 12:31 PM

## 2022-06-20 NOTE — Progress Notes (Signed)
   06/20/22 1317  Assess: MEWS Score  Temp 98.5 F (36.9 C)  BP 103/70  MAP (mmHg) 79  Pulse Rate (!) 105  Resp (!) 25  Level of Consciousness Alert  SpO2 96 %  O2 Device Room Air  Assess: MEWS Score  MEWS Temp 0  MEWS Systolic 0  MEWS Pulse 1  MEWS RR 1  MEWS LOC 0  MEWS Score 2  MEWS Score Color Yellow  Assess: if the MEWS score is Yellow or Red  Were vital signs taken at a resting state? Yes  Focused Assessment Change from prior assessment (see assessment flowsheet)  Does the patient meet 2 or more of the SIRS criteria? Yes  Does the patient have a confirmed or suspected source of infection? Yes  Provider and Rapid Response Notified? No  MEWS guidelines implemented *See Row Information* No, previously yellow, continue vital signs every 4 hours  Treat  MEWS Interventions Administered prn meds/treatments;Escalated (See documentation below)  Escalate  MEWS: Escalate Yellow: discuss with charge nurse/RN and consider discussing with provider and RRT  Notify: Charge Nurse/RN  Name of Charge Nurse/RN Notified Rise Paganini, RN  Date Charge Nurse/RN Notified 06/20/22  Time Charge Nurse/RN Notified 1417  Assess: SIRS CRITERIA  SIRS Temperature  0  SIRS Pulse 1  SIRS Respirations  1  SIRS WBC 0  SIRS Score Sum  2   Will continue checking patient's VS q4.  Angie Fava, RN

## 2022-06-21 DIAGNOSIS — K5720 Diverticulitis of large intestine with perforation and abscess without bleeding: Secondary | ICD-10-CM | POA: Diagnosis not present

## 2022-06-21 LAB — GLUCOSE, CAPILLARY
Glucose-Capillary: 112 mg/dL — ABNORMAL HIGH (ref 70–99)
Glucose-Capillary: 109 mg/dL — ABNORMAL HIGH (ref 70–99)

## 2022-06-21 NOTE — TOC Progression Note (Signed)
Transition of Care [facility]) - Progression Note    Patient Details  Name: Morgan Cline MRN: [id number] Date of Birth: 07/13/1971  [facility] CM/SW Contact  Ross Ludwig, [place] Phone Number: 06/21/2022, 3:20 PM  Clinical Narrative:     CSW spoke to [facility] admissions, they have reviewed patient's information.  They are unable to start auth today because of the weekend, they will start tomorrow.  CSW to continue to follow patient's progress throughout discharge planning.   Expected Discharge Plan: [place] Barriers to Discharge: No [place] will accept this patient  Expected Discharge Plan and Services Expected Discharge Plan: [place]   Discharge Planning Services: CM Consult Post Acute Care Choice: Durable Medical Equipment, Home Health   Expected Discharge Date: 06/17/22               DME Arranged: Negative pressure wound device DME Agency: [employer] Date DME Agency Contacted: 06/15/22 Time DME Agency Contacted: 59 Representative spoke with at DME Agency: tracey barrow HH Arranged: RN   Date [place]: 06/15/22       Social Determinants of Health ([place]) Interventions    Readmission Risk Interventions    06/01/2022   11:45 AM  Readmission Risk Prevention Plan  Post Dischage Appt Complete  Medication Screening Complete  Transportation Screening Complete

## 2022-06-21 NOTE — TOC Progression Note (Addendum)
Transition of Care [facility]) - Progression Note    Patient Details  Name: Morgan Cline MRN: [id number] Date of Birth: 1971-07-16  Transition [facility] Care [facility]) CM/SW Contact  Ross Ludwig, [place] Phone Number: 06/20/2022  Clinical Narrative:     CIR reviewing patient waiting for decision if patient can go to CIR.    Expected Discharge Plan: [employer] Barriers to Discharge: No [place] will accept this patient  Expected Discharge Plan and Services Expected Discharge Plan: [place]   Discharge Planning Services: CM Consult Post Acute Care Choice: Durable Medical Equipment, Home Health   Expected Discharge Date: 06/17/22               DME Arranged: Negative pressure wound device DME Agency: [employer] Date DME Agency Contacted: 06/15/22 Time DME Agency Contacted: 68 Representative spoke with at DME Agency: tracey barrow HH Arranged: RN   Date [place]: 06/15/22       Social Determinants of Health ([place]) Interventions    Readmission Risk Interventions    06/01/2022   11:45 AM  Readmission Risk Prevention Plan  Post Dischage Appt Complete  Medication Screening Complete  Transportation Screening Complete

## 2022-06-21 NOTE — Progress Notes (Signed)
PROGRESS NOTE    Morgan Cline  TML:[id number] DOB: 1971/12/02 DOA: 05/31/2022 PCP: [facility]   Brief Narrative: 51 year old female with history of prediabetes, obesity, comes to the hospital with abdominal pain.  She was recently seen in the ED couple days prior to admission with sigmoid diverticulitis, she was given Augmentin and sent home, however returned due to worsening pain.  A CT scan showed worsening perforated diverticulitis with abscess formation.  General surgery was consulted and eventually underwent Hartman's procedure on 6/30.  Assessment & Plan:   Principal Problem:   Perforation of sigmoid colon due to diverticulitis Active Problems:   Obesity, Class III, BMI 40-49.9 (morbid obesity) (HCC)   Prediabetes   Hyponatremia   Hypokalemia   Bandemia   Normocytic anemia    Sigmoid colon perforation due to diverticulitis, multiple abscesses, postop ileus-appreciate general surgery follow-up.  She is status post sigmoid colectomy and end colostomy, takedown of splenic flexure by Dr. Barry Dienes on 6/30.  Postop course complicated by ileus, NG tube, now resolved, NG tube was removed in the interval and she is eating well.  She had persistent WBC elevation and underwent a CT scan of the abdomen and pelvis 7/5 which showed a left-sided fluid collection.  She is status post IR guided drainage on 7/6.  WBC continues to improve, cultures without growth.  Completed Zosyn 06/16/2022 Still waiting on safe disposition denied by CIR and SNF   Active problems  Hyponatremia-resolved Hypokalemia -resolved Thrombocytosis -likely reactive monitor Normocytic Anemia -no bleeding, monitor   Pre-Diabetes-CBGs with good control on Ozempic prior to admission CBG (last 3)  Recent Labs    06/20/22 1601 06/20/22 2349 06/21/22 0824  GLUCAP 115* 141* 112*    Tachycardia-ekg Chest xray Echo Low dose metoprolol     Estimated body mass index is 40.34 kg/m as calculated from the  following:   Height as of this encounter: '5\' 4"'$  (1.626 m).   Weight as of this encounter: 106.6 kg.  DVT prophylaxis: Lovenox  code Status: Full code Family Communication: None Disposition Plan:  Status is: Inpatient Remains inpatient appropriate because: Await safe DC plan   Consultants:  General surgery  Subjective: She has no new complaints Awaiting authorization for discharge to skilled nursing facility or Ci[facility] Encourage her to use incentive spirometer as she has bilateral atelectasis Objective: Vitals:   06/20/22 1948 06/21/22 0534 06/21/22 1119 06/21/22 1237  BP: 103/62 97/66  107/75  Pulse: 100 79 82 (!) 102  Resp:  17  18  Temp: 98.3 F (36.8 C) 97.9 F (36.6 C)  98.2 F (36.8 C)  TempSrc:    Oral  SpO2: 96% 96%  98%  Weight:      Height:        Intake/Output Summary (Last 24 hours) at 06/21/2022 1524 Last data filed at 06/21/2022 1218 Gross per 24 hour  Intake 612 ml  Output 330 ml  Net 282 ml    Filed Weights   05/31/22 2156 06/01/22 0410 06/05/22 1154  Weight: 106.6 kg 115.3 kg 106.6 kg    Examination:on RA   General exam: Appears in no acute distress  respiratory system: diminished  to auscultation. Respiratory effort normal. Cardiovascular system: S1 & S2 heard, RRR. No JVD, murmurs, rubs, gallops or clicks. No pedal edema. Gastrointestinal system: Abdomen is nondistended, soft and nontender. No organomegaly or masses felt. Normal bowel sounds heard.  Wound VAC in place, drain in place COLOSTOMY WITH STOOL Central nervous system: Alert and oriented. No  focal neurological deficits. Extremities: TRACE EDEMA  Skin: No rashes, lesions or ulcers Psychiatry: Judgement and insight appear normal. Mood & affect appropriate.     Data Reviewed: I have personally reviewed following labs and imaging studies  CBC: Recent Labs  Lab 06/16/22 0425 06/17/22 0433 06/20/22 1649  WBC 11.6* 9.9 8.1  HGB 8.6* 8.9* 9.6*  HCT 27.3* 28.7* 30.5*  MCV 90.7 91.1  89.2  PLT 477* 517* 477*    Basic Metabolic Panel: Recent Labs  Lab 06/16/22 0425 06/17/22 0433 06/20/22 1649  NA 139 142 140  K 3.6 3.5 3.8  CL 105 109 109  CO2 '28 27 25  '$ GLUCOSE 129* 120* 131*  BUN '9 8 12  '$ CREATININE 0.67 0.78 0.55  CALCIUM 8.0* 8.4* 8.8*    GFR: Estimated Creatinine Clearance: 102.5 mL/min (by C-G formula based on SCr of 0.55 mg/dL). Liver Function Tests: Recent Labs  Lab 06/16/22 0425 06/20/22 1649  AST 17 22  ALT 20 24  ALKPHOS 87 127*  BILITOT 0.5 0.4  PROT 6.0* 7.2  ALBUMIN 2.2* 2.4*    No results for input(s): "LIPASE", "AMYLASE" in the last 168 hours. No results for input(s): "AMMONIA" in the last 168 hours. Coagulation Profile: No results for input(s): "INR", "PROTIME" in the last 168 hours. Cardiac Enzymes: No results for input(s): "CKTOTAL", "CKMB", "CKMBINDEX", "TROPONINI" in the last 168 hours. BNP (last 3 results) No results for input(s): "PROBNP" in the last 8760 hours. HbA1C: No results for input(s): "HGBA1C" in the last 72 hours. CBG: Recent Labs  Lab 06/20/22 0004 06/20/22 0731 06/20/22 1601 06/20/22 2349 06/21/22 0824  GLUCAP 119* 116* 115* 141* 112*    Lipid Profile: No results for input(s): "CHOL", "HDL", "LDLCALC", "TRIG", "CHOLHDL", "LDLDIRECT" in the last 72 hours. Thyroid Function Tests: No results for input(s): "TSH", "T4TOTAL", "FREET4", "T3FREE", "THYROIDAB" in the last 72 hours. Anemia Panel: No results for input(s): "VITAMINB12", "FOLATE", "FERRITIN", "TIBC", "IRON", "RETICCTPCT" in the last 72 hours. Sepsis Labs: No results for input(s): "PROCALCITON", "LATICACIDVEN" in the last 168 hours.  No results found for this or any previous visit (from the past 240 hour(s)).        Radiology Studies: ECHOCARDIOGRAM COMPLETE  Result Date: 06/20/2022    ECHOCARDIOGRAM REPORT   Patient Name:   Morgan Cline Date of Exam: 06/20/2022 Medical Rec #:  [id number]       Height:       64.0 in Accession #:     [id number]      Weight:       235.0 lb Date of Birth:  10-03-1971       BSA:          2.095 m Patient Age:    22 years        BP:           103/70 mmHg Patient Gender: F               HR:           90 bpm. Exam Location:  Inpatient Procedure: 2D Echo, Cardiac Doppler, Color Doppler and Intracardiac            Opacification Agent Indications:    Abnormal ECG R94.31  History:        Patient has no prior history of Echocardiogram examinations.                 Signs/Symptoms:Obesity; Risk Factors:Prediabetes.  Sonographer:    [place] Referring Phys: [phone]  Davonna Ertl G Janal Haak  Sonographer Comments: Suboptimal apical window. IMPRESSIONS  1. Left ventricular ejection fraction, by estimation, is 65 to 70%. The left ventricle has normal function. The left ventricle has no regional wall motion abnormalities. Left ventricular diastolic parameters were normal.  2. Right ventricular systolic function is normal. The right ventricular size is normal.  3. The mitral valve was not well visualized. Trivial mitral valve regurgitation. No evidence of mitral stenosis.  4. The tricuspid valve is abnormal.  5. The aortic valve is tricuspid. Aortic valve regurgitation is not visualized. No aortic stenosis is present. FINDINGS  Left Ventricle: Left ventricular ejection fraction, by estimation, is 65 to 70%. The left ventricle has normal function. The left ventricle has no regional wall motion abnormalities. Definity contrast agent was given IV to delineate the left ventricular  endocardial borders. The left ventricular internal cavity size was normal in size. There is no left ventricular hypertrophy. Left ventricular diastolic parameters were normal. Right Ventricle: The right ventricular size is normal. Right vetricular wall thickness was not well visualized. Right ventricular systolic function is normal. Left Atrium: Left atrial size was normal in size. Right Atrium: Right atrial size was normal in size. Pericardium: There is no  evidence of pericardial effusion. Mitral Valve: The mitral valve was not well visualized. Trivial mitral valve regurgitation. No evidence of mitral valve stenosis. Tricuspid Valve: The tricuspid valve is abnormal. Tricuspid valve regurgitation is mild . No evidence of tricuspid stenosis. Aortic Valve: The aortic valve is tricuspid. Aortic valve regurgitation is not visualized. No aortic stenosis is present. Aortic valve mean gradient measures 7.8 mmHg. Aortic valve peak gradient measures 17.2 mmHg. Aortic valve area, by VTI measures 1.90  cm. Pulmonic Valve: The pulmonic valve was not well visualized. Pulmonic valve regurgitation is not visualized. No evidence of pulmonic stenosis. Aorta: The aortic root is normal in size and structure. IAS/Shunts: The interatrial septum was not well visualized.  LEFT VENTRICLE PLAX 2D LVIDd:         5.20 cm   Diastology LVIDs:         3.30 cm   LV e' medial:    8.81 cm/s LV PW:         0.90 cm   LV E/e' medial:  9.4 LV IVS:        0.90 cm   LV e' lateral:   9.57 cm/s LVOT diam:     2.00 cm   LV E/e' lateral: 8.6 LV SV:         62 LV SV Index:   30 LVOT Area:     3.14 cm  RIGHT VENTRICLE RV S prime:     20.20 cm/s TAPSE (M-mode): 2.7 cm LEFT ATRIUM             Index        RIGHT ATRIUM           Index LA diam:        3.60 cm 1.72 cm/m   RA Area:     11.70 cm LA Vol (A2C):   40.2 ml 19.19 ml/m  RA Volume:   27.10 ml  12.93 ml/m LA Vol (A4C):   38.2 ml 18.23 ml/m LA Biplane Vol: 40.3 ml 19.23 ml/m  AORTIC VALVE AV Area (Vmax):    1.76 cm AV Area (Vmean):   1.82 cm AV Area (VTI):     1.90 cm AV Vmax:           207.49 cm/s AV Vmean:  127.892 cm/s AV VTI:            0.328 m AV Peak Grad:      17.2 mmHg AV Mean Grad:      7.8 mmHg LVOT Vmax:         116.51 cm/s LVOT Vmean:        74.185 cm/s LVOT VTI:          0.198 m LVOT/AV VTI ratio: 0.60  AORTA Ao Root diam: 3.10 cm Ao Asc diam:  3.10 cm MITRAL VALVE               TRICUSPID VALVE MV Area (PHT): 4.68 cm    TR Peak  grad:   20.6 mmHg MV Decel Time: 162 msec    TR Vmax:        227.00 cm/s MV E velocity: 82.60 cm/s MV A velocity: 67.30 cm/s  SHUNTS MV E/A ratio:  1.23        Systemic VTI:  0.20 m                            Systemic Diam: 2.00 cm Carlyle Dolly MD Electronically signed by Carlyle Dolly MD Signature Date/Time: 06/20/2022/5:10:59 PM    Final    DG Chest 1 View  Result Date: 06/20/2022 CLINICAL DATA:  Shortness of breath and chest pain. EXAM: CHEST  1 VIEW COMPARISON:  11/16/2011 chest radiograph FINDINGS: This is a low volume study with bibasilar atelectasis present. The visualized cardiomediastinal silhouette is unremarkable. There is no evidence of focal airspace disease, pulmonary edema, suspicious pulmonary nodule/mass, pleural effusion, or pneumothorax. No acute bony abnormalities are identified. IMPRESSION: Low volume study with bibasilar atelectasis. Electronically Signed   By: Margarette Canada M.D.   On: 06/20/2022 12:47        Scheduled Meds:  acetaminophen  1,000 mg Oral Q6H   enoxaparin (LOVENOX) injection  40 mg Subcutaneous Q24H   feeding supplement  237 mL Oral BID BM   hydrocortisone cream   Topical QID   methocarbamol  500 mg Oral Q6H   metoprolol succinate  12.5 mg Oral Daily   sodium chloride flush  5 mL Intracatheter Q8H   Continuous Infusions:  promethazine (PHENERGAN) injection (IM or IVPB)       LOS: 20 days    Time spent: 35  min  Georgette Shell, MD 06/21/2022, 3:24 PM

## 2022-06-22 LAB — GLUCOSE, CAPILLARY
Glucose-Capillary: 111 mg/dL — ABNORMAL HIGH (ref 70–99)
Glucose-Capillary: 121 mg/dL — ABNORMAL HIGH (ref 70–99)
Glucose-Capillary: 121 mg/dL — ABNORMAL HIGH (ref 70–99)

## 2022-06-22 NOTE — Progress Notes (Signed)
Inpatient Rehab Admissions Coordinator:  ?Insurance authorization started. Will continue to follow. ? ? ?Odessia Asleson Graves Madden, MS, CCC-SLP ?Admissions Coordinator ?[phone] ? ?

## 2022-06-22 NOTE — Progress Notes (Signed)
PROGRESS NOTE    Morgan Cline  GYF:[id number] DOB: [birth year] DOA: 05/31/2022 PCP: Morgan Cline   Brief Narrative: 51 year old female with history of prediabetes, obesity, comes to the hospital with abdominal pain.  She was recently seen in the ED couple days prior to admission with sigmoid diverticulitis, she was given Augmentin and sent home, however returned due to worsening pain.  A CT scan showed worsening perforated diverticulitis with abscess formation.  General surgery was consulted and eventually underwent Hartman's procedure on 6/30.  Assessment & Plan:   Principal Problem:   Perforation of sigmoid colon due to diverticulitis Active Problems:   Obesity, Class III, BMI 40-49.9 (morbid obesity) (HCC)   Prediabetes   Hyponatremia   Hypokalemia   Bandemia   Normocytic anemia    Sigmoid colon perforation due to diverticulitis, multiple abscesses, postop ileus-appreciate general surgery follow-up.  She is status post sigmoid colectomy and end colostomy, takedown of splenic flexure by Morgan Cline on 6/30.  Postop course complicated by ileus, NG tube, now resolved, NG tube was removed in the interval and she is eating well.  She had persistent WBC elevation and underwent a CT scan of the abdomen and pelvis 7/5 which showed a left-sided fluid collection.  She is status post IR guided drainage on 7/6.  WBC continues to improve, cultures without growth.  Completed Zosyn 06/16/2022 Still waiting on safe disposition  Cir consulted   Active problems  Hyponatremia-resolved Hypokalemia -resolved Thrombocytosis -likely reactive monitor Normocytic Anemia -no bleeding, monitor   Pre-Diabetes-CBGs with good control on Ozempic prior to admission CBG (last 3)  Recent Labs    06/21/22 1603 06/22/22 0024 06/22/22 0743  GLUCAP 109* 121* 111*    Tachycardia-ekg normal sinus rhythm Chest xray bibasilar atelectasis Echo normal ejection fraction Low dose metoprolol      Estimated body mass index is 41.47 kg/m as calculated from the following:   Height as of this encounter: '5\' 4"'$  (1.626 m).   Weight as of this encounter: 109.6 kg.  DVT prophylaxis: Lovenox  code Status: Full code Family Communication: None Disposition Plan:  Status is: Inpatient Remains inpatient appropriate because: Await safe DC plan Medically stable to be discharged Consultants:  General surgery  Subjective: No complaints awaiting disposition  objective: Vitals:   06/21/22 1119 06/21/22 1237 06/21/22 2013 06/22/22 0541  BP:  107/75 108/72 113/73  Pulse: 82 (!) 102 (!) 103 95  Resp:  18 18   Temp:  98.2 F (36.8 C) 98.1 F (36.7 C) 98 F (36.7 C)  TempSrc:  Oral Oral Oral  SpO2:  98% 97% 97%  Weight:    109.6 kg  Height:        Intake/Output Summary (Last 24 hours) at 06/22/2022 1331 Last data filed at 06/22/2022 1318 Gross per 24 hour  Intake 682 ml  Output 150 ml  Net 532 ml    Filed Weights   06/01/22 0410 06/05/22 1154 06/22/22 0541  Weight: 115.3 kg 106.6 kg 109.6 kg    Examination:on RA   General exam: Appears in no acute distress  respiratory system: diminished  to auscultation. Respiratory effort normal. Cardiovascular system: S1 & S2 heard, RRR. No JVD, murmurs, rubs, gallops or clicks. No pedal edema. Gastrointestinal system: Abdomen is nondistended, soft and nontender. No organomegaly or masses felt. Normal bowel sounds heard.  Wound VAC in place, drain in place COLOSTOMY WITH STOOL Central nervous system: Alert and oriented. No focal neurological deficits. Extremities: TRACE EDEMA  Skin: No rashes,  lesions or ulcers Psychiatry: Judgement and insight appear normal. Mood & affect appropriate.     Data Reviewed: I have personally reviewed following labs and imaging studies  CBC: Recent Labs  Lab 06/16/22 0425 06/17/22 0433 06/20/22 1649  WBC 11.6* 9.9 8.1  HGB 8.6* 8.9* 9.6*  HCT 27.3* 28.7* 30.5*  MCV 90.7 91.1 89.2  PLT 477* 517*  477*    Basic Metabolic Panel: Recent Labs  Lab 06/16/22 0425 06/17/22 0433 06/20/22 1649  NA 139 142 140  K 3.6 3.5 3.8  CL 105 109 109  CO2 '28 27 25  '$ GLUCOSE 129* 120* 131*  BUN '9 8 12  '$ CREATININE 0.67 0.78 0.55  CALCIUM 8.0* 8.4* 8.8*    GFR: Estimated Creatinine Clearance: 104.1 mL/min (by C-G formula based on SCr of 0.55 mg/dL). Liver Function Tests: Recent Labs  Lab 06/16/22 0425 06/20/22 1649  AST 17 22  ALT 20 24  ALKPHOS 87 127*  BILITOT 0.5 0.4  PROT 6.0* 7.2  ALBUMIN 2.2* 2.4*    No results for input(s): "LIPASE", "AMYLASE" in the last 168 hours. No results for input(s): "AMMONIA" in the last 168 hours. Coagulation Profile: No results for input(s): "INR", "PROTIME" in the last 168 hours. Cardiac Enzymes: No results for input(s): "CKTOTAL", "CKMB", "CKMBINDEX", "TROPONINI" in the last 168 hours. BNP (last 3 results) No results for input(s): "PROBNP" in the last 8760 hours. HbA1C: No results for input(s): "HGBA1C" in the last 72 hours. CBG: Recent Labs  Lab 06/20/22 2349 06/21/22 0824 06/21/22 1603 06/22/22 0024 06/22/22 0743  GLUCAP 141* 112* 109* 121* 111*    Lipid Profile: No results for input(s): "CHOL", "HDL", "LDLCALC", "TRIG", "CHOLHDL", "LDLDIRECT" in the last 72 hours. Thyroid Function Tests: No results for input(s): "TSH", "T4TOTAL", "FREET4", "T3FREE", "THYROIDAB" in the last 72 hours. Anemia Panel: No results for input(s): "VITAMINB12", "FOLATE", "FERRITIN", "TIBC", "IRON", "RETICCTPCT" in the last 72 hours. Sepsis Labs: No results for input(s): "PROCALCITON", "LATICACIDVEN" in the last 168 hours.  No results found for this or any previous visit (from the past 240 hour(s)).        Radiology Studies: ECHOCARDIOGRAM COMPLETE  Result Date: 06/20/2022    ECHOCARDIOGRAM REPORT   Patient Name:   Morgan Cline Date of Exam: 06/20/2022 Cline Rec #:  [id number]       Height:       64.0 in Accession #:    [id number]      Weight:        235.0 lb Date of Birth:  [birth year]       BSA:          2.095 m Patient Age:    40 years        BP:           103/70 mmHg Patient Gender: F               HR:           90 bpm. Exam Location:  Inpatient Procedure: 2D Echo, Cardiac Doppler, Color Doppler and Intracardiac            Opacification Agent Indications:    Abnormal ECG R94.31  History:        Patient has no prior history of Echocardiogram examinations.                 Signs/Symptoms:Obesity; Risk Factors:Prediabetes.  Sonographer:    Luane School RDCS Referring Phys: [id number] Noland Fordyce Eason Housman  Sonographer Comments: Suboptimal apical window. IMPRESSIONS  1. Left ventricular ejection fraction, by estimation, is 65 to 70%. The left ventricle has normal function. The left ventricle has no regional wall motion abnormalities. Left ventricular diastolic parameters were normal.  2. Right ventricular systolic function is normal. The right ventricular size is normal.  3. The mitral valve was not well visualized. Trivial mitral valve regurgitation. No evidence of mitral stenosis.  4. The tricuspid valve is abnormal.  5. The aortic valve is tricuspid. Aortic valve regurgitation is not visualized. No aortic stenosis is present. FINDINGS  Left Ventricle: Left ventricular ejection fraction, by estimation, is 65 to 70%. The left ventricle has normal function. The left ventricle has no regional wall motion abnormalities. Definity contrast agent was given IV to delineate the left ventricular  endocardial borders. The left ventricular internal cavity size was normal in size. There is no left ventricular hypertrophy. Left ventricular diastolic parameters were normal. Right Ventricle: The right ventricular size is normal. Right vetricular wall thickness was not well visualized. Right ventricular systolic function is normal. Left Atrium: Left atrial size was normal in size. Right Atrium: Right atrial size was normal in size. Pericardium: There is no evidence of pericardial  effusion. Mitral Valve: The mitral valve was not well visualized. Trivial mitral valve regurgitation. No evidence of mitral valve stenosis. Tricuspid Valve: The tricuspid valve is abnormal. Tricuspid valve regurgitation is mild . No evidence of tricuspid stenosis. Aortic Valve: The aortic valve is tricuspid. Aortic valve regurgitation is not visualized. No aortic stenosis is present. Aortic valve mean gradient measures 7.8 mmHg. Aortic valve peak gradient measures 17.2 mmHg. Aortic valve area, by VTI measures 1.90  cm. Pulmonic Valve: The pulmonic valve was not well visualized. Pulmonic valve regurgitation is not visualized. No evidence of pulmonic stenosis. Aorta: The aortic root is normal in size and structure. IAS/Shunts: The interatrial septum was not well visualized.  LEFT VENTRICLE PLAX 2D LVIDd:         5.20 cm   Diastology LVIDs:         3.30 cm   LV e' medial:    8.81 cm/s LV PW:         0.90 cm   LV E/e' medial:  9.4 LV IVS:        0.90 cm   LV e' lateral:   9.57 cm/s LVOT diam:     2.00 cm   LV E/e' lateral: 8.6 LV SV:         62 LV SV Index:   30 LVOT Area:     3.14 cm  RIGHT VENTRICLE RV S prime:     20.20 cm/s TAPSE (M-mode): 2.7 cm LEFT ATRIUM             Index        RIGHT ATRIUM           Index LA diam:        3.60 cm 1.72 cm/m   RA Area:     11.70 cm LA Vol (A2C):   40.2 ml 19.19 ml/m  RA Volume:   27.10 ml  12.93 ml/m LA Vol (A4C):   38.2 ml 18.23 ml/m LA Biplane Vol: 40.3 ml 19.23 ml/m  AORTIC VALVE AV Area (Vmax):    1.76 cm AV Area (Vmean):   1.82 cm AV Area (VTI):     1.90 cm AV Vmax:           207.49 cm/s AV Vmean:          127.892 cm/s  AV VTI:            0.328 m AV Peak Grad:      17.2 mmHg AV Mean Grad:      7.8 mmHg LVOT Vmax:         116.51 cm/s LVOT Vmean:        74.185 cm/s LVOT VTI:          0.198 m LVOT/AV VTI ratio: 0.60  AORTA Ao Root diam: 3.10 cm Ao Asc diam:  3.10 cm MITRAL VALVE               TRICUSPID VALVE MV Area (PHT): 4.68 cm    TR Peak grad:   20.6 mmHg MV  Decel Time: 162 msec    TR Vmax:        227.00 cm/s MV E velocity: 82.60 cm/s MV A velocity: 67.30 cm/s  SHUNTS MV E/A ratio:  1.23        Systemic VTI:  0.20 m                            Systemic Diam: 2.00 cm Carlyle Dolly MD Electronically signed by Carlyle Dolly MD Signature Date/Time: 06/20/2022/5:10:59 PM    Final         Scheduled Meds:  acetaminophen  1,000 mg Oral Q6H   enoxaparin (LOVENOX) injection  40 mg Subcutaneous Q24H   feeding supplement  237 mL Oral BID BM   hydrocortisone cream   Topical QID   methocarbamol  500 mg Oral Q6H   metoprolol succinate  12.5 mg Oral Daily   sodium chloride flush  5 mL Intracatheter Q8H   Continuous Infusions:  promethazine (PHENERGAN) injection (IM or IVPB)       LOS: 21 days    Time spent: 35  min  Georgette Shell, MD 06/22/2022, 1:31 PM

## 2022-06-22 NOTE — Progress Notes (Signed)
Physical Therapy Treatment Patient Details Name: Morgan Cline MRN: [id number] DOB: 09-19-1971 Today's Date: 06/22/2022   History of Present Illness 51 year old female with PMH of morbid obesity, prediabetes and migraine headache returning with worsening abdominal pain due to acute sigmoid diverticulitis despite oral antibiotics.  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    PT Comments    Pt supine in bed with high complaints of pain but agreeable to be seen. Required increased time for all mobility and rest breaks to allow for pain waves to subside, max encouragement and cuing throughout.  Pt enjoys music so played Rana Snare throughout session for motivation. Pt required min-mod assist for bed mobility, min guard for transfers from elevated surface, and min guard for ambulation with RW in hallway ~37f +2 for safety and recliner follow for safety only, no physical assist provided. Pt is limited by pain but is very motivated to progress and recover PLOF. Discharge destination remains appropriate. We will continue to follow acutely.   Recommendations for follow up therapy are one component of a multi-disciplinary discharge planning process, led by the attending physician.  Recommendations may be updated based on patient status, additional functional criteria and insurance authorization.  Follow Up Recommendations  Acute inpatient rehab (3hours/day) (difficult to plan for DC as pt has been denied for SNF and CIR)     Assistance Recommended at Discharge Intermittent Supervision/Assistance  Patient can return home with the following A little help with walking and/or transfers;A little help with bathing/dressing/bathroom;Help with stairs or ramp for entrance;Assistance with cooking/housework;Assist for transportation   Equipment Recommendations  None recommended by PT (TBD at next venue of care)    Recommendations for Other  Services       Precautions / Restrictions Precautions Precautions: Fall Precaution Comments: colostomy, wound VAC, JP drain on L Restrictions Weight Bearing Restrictions: No     Mobility  Bed Mobility Overal bed mobility: Needs Assistance Bed Mobility: Rolling, Sidelying to Sit Rolling: Min assist Sidelying to sit: Mod assist       General bed mobility comments: Pt required min assist for rolling onto L side, then mod assist to elevate trunk. Pt required many rest breaks and max encouragement but very motivated to move.    Transfers Overall transfer level: Needs assistance Equipment used: Rolling walker (2 wheels) Transfers: Sit to/from Stand Sit to Stand: Min guard, From elevated surface   Step pivot transfers: Supervision       General transfer comment: increased time, VCs for technique, min/guard A to power up from highly elevated surface (pt used height to "slide" into standing).    Ambulation/Gait Ambulation/Gait assistance: Min guard, +2 safety/equipment Gait Distance (Feet): 35 Feet Assistive device: Rolling walker (2 wheels) Gait Pattern/deviations: Step-through pattern, Decreased stride length, Trunk flexed Gait velocity: decreased     General Gait Details: slow but functional gait, +2 with recliner follow for safety, pt motivated to continue ambulating and improved gait pattern with increased distance; VCs for posture and for relaxation breathing   Stairs             Wheelchair Mobility    Modified Rankin (Stroke Patients Only)       Balance Overall balance assessment: Needs assistance Sitting-balance support: No upper extremity supported, Feet supported Sitting balance-Leahy Scale: Fair     Standing balance support: Reliant on assistive device for balance, Bilateral upper extremity supported, During functional activity Standing balance-Leahy Scale: Poor  Cognition Arousal/Alertness:  Awake/alert Behavior During Therapy: WFL for tasks assessed/performed Overall Cognitive Status: Within Functional Limits for tasks assessed                                 General Comments: very pleasant, puts forth good effort        Exercises      General Comments        Pertinent Vitals/Pain Pain Assessment Pain Assessment: 0-10 Pain Score: 10-Worst pain ever Faces Pain Scale: Hurts little more Pain Location: abdomen Pain Descriptors / Indicators: Aching, Grimacing, Operative site guarding, Stabbing, Sharp Pain Intervention(s): Limited activity within patient's tolerance, Monitored during session, Repositioned    Home Living                          Prior Function            PT Goals (current goals can now be found in the care plan section) Acute Rehab PT Goals Patient Stated Goal: less pain and improve mobility PT Goal Formulation: With patient Time For Goal Achievement: 06/20/22 Potential to Achieve Goals: Good Progress towards PT goals: Progressing toward goals    Frequency    Min 3X/week      PT Plan Current plan remains appropriate    Co-evaluation              AM-PAC PT "6 Clicks" Mobility   Outcome Measure  Help needed turning from your back to your side while in a flat bed without using bedrails?: A Little Help needed moving from lying on your back to sitting on the side of a flat bed without using bedrails?: A Lot Help needed moving to and from a bed to a chair (including a wheelchair)?: A Little Help needed standing up from a chair using your arms (e.g., wheelchair or bedside chair)?: A Little Help needed to walk in hospital room?: A Little Help needed climbing 3-5 steps with a railing? : A Lot 6 Click Score: 16    End of Session Equipment Utilized During Treatment: Gait belt Activity Tolerance: Patient limited by pain Patient left: with nursing/sitter in room (OT took pt while on [facility]) Nurse Communication:  Mobility status PT Visit Diagnosis: Other abnormalities of gait and mobility (R26.89);Pain Pain - Right/Left:  (mid) Pain - part of body:  (abdomen)     Time: 1023-1100 PT Time Calculation (min) (ACUTE ONLY): 37 min  Charges:  $Gait Training: 8-22 mins $Therapeutic Activity: 8-22 mins                     Coolidge Breeze, PT, DPT [facility] Department Office: [phone] Pager: [phone]   Coolidge Breeze 06/22/2022, 12:13 PM

## 2022-06-22 NOTE — Progress Notes (Signed)
Referring Physician(s): Cornett, T  Supervising Physician: Ruthann Cancer  Patient Status:  [facility] - In-pt  Chief Complaint:  Abdominal pain, fluid collection  Subjective:  Pt sitting upright in bed talking on cell phone. She states her pain is resolving with pain meds. She denies fever, chills, N/V.   Allergies: Codeine, Compazine [prochlorperazine], and Pineapple  Medications: Prior to Admission medications   Medication Sig Start Date End Date Taking? Authorizing Provider  amoxicillin-clavulanate (AUGMENTIN) 875-125 MG tablet Take 1 tablet by mouth every 12 (twelve) hours. 05/29/22  Yes Jeanell Sparrow, DO  Aspirin-Salicylamide-Caffeine (BC HEADACHE POWDER PO) Take 1 packet by mouth daily as needed (headaches).   Yes [provider]  diphenhydramine-acetaminophen (TYLENOL PM) 25-500 MG TABS tablet Take 1 tablet by mouth at bedtime as needed (sleep).   Yes [provider]  Fiber Select Gummies CHEW Chew 3 tablets by mouth at bedtime.   Yes [provider]  HYDROcodone-acetaminophen (NORCO) 5-325 MG tablet Take 1 tablet by mouth every 6 (six) hours as needed for moderate pain. 05/29/22  Yes Wynona Dove A, DO  ibuprofen (ADVIL) 600 MG tablet Take 1 tablet (600 mg total) by mouth every 6 (six) hours as needed. Patient taking differently: Take 600 mg by mouth every 6 (six) hours as needed (pain). 05/29/22  Yes Jeanell Sparrow, DO  levonorgestrel (MIRENA) 20 MCG/24HR IUD 1 each by Intrauterine route once. Implanted September 2020   Yes [provider]  ondansetron (ZOFRAN) 4 MG tablet Take 1 tablet (4 mg total) by mouth every 4 (four) hours as needed for nausea or vomiting. 05/29/22  Yes Jeanell Sparrow, DO  Semaglutide, 1 MG/DOSE, (OZEMPIC, 1 MG/DOSE,) 4 MG/3ML SOPN Inject 1 mg into the skin every Sunday.   Yes [provider]  acetaminophen (TYLENOL) 500 MG tablet Take 2 tablets (1,000 mg total) by mouth every 6 (six) hours. 06/17/22   Georgette Shell, MD  hydrocortisone cream 0.5 % Apply topically 4 (four) times daily. 06/17/22   Georgette Shell, MD  methocarbamol (ROBAXIN) 500 MG tablet Take 1 tablet (500 mg total) by mouth every 6 (six) hours. 06/17/22   Georgette Shell, MD  oxyCODONE (OXY IR/ROXICODONE) 5 MG immediate release tablet Take 1-2 tablets (5-10 mg total) by mouth every 6 (six) hours as needed for moderate pain, severe pain or breakthrough pain. 06/17/22   Georgette Shell, MD     Vital Signs: BP 113/73 (BP Location: Left Arm)   Pulse 95   Temp 98 F (36.7 C) (Oral)   Resp 18   Ht '5\' 4"'$  (1.626 m)   Wt 241 lb 10 oz (109.6 kg)   LMP 06/09/2022   SpO2 97%   BMI 41.47 kg/m   Physical Exam Vitals reviewed.  Constitutional:      General: She is not in acute distress.    Appearance: She is not ill-appearing.  Eyes:     Extraocular Movements: Extraocular movements intact.     Pupils: Pupils are equal, round, and reactive to light.  Cardiovascular:     Pulses: Normal pulses.  Pulmonary:     Effort: Pulmonary effort is normal. No respiratory distress.  Abdominal:     Comments: LUQ drain flushes with some force, does not aspirate. Site unremarkable with sutures/statlock in place. Scant serosanguineous OP in JP. Dressing C/D/I.    Skin:    General: Skin is warm and dry.  Neurological:     Mental Status: She is alert and  oriented to person, place, and time.  Psychiatric:        Mood and Affect: Mood normal.        Behavior: Behavior normal.        Thought Content: Thought content normal.        Judgment: Judgment normal.     Imaging: ECHOCARDIOGRAM COMPLETE  Result Date: 06/20/2022    ECHOCARDIOGRAM REPORT   Patient Name:   Morgan Cline Date of Exam: 06/20/2022 Medical Rec #:  [id number]       Height:       64.0 in Accession #:    [id number]      Weight:       235.0 lb Date of Birth:  [birth year]       BSA:          2.095 m Patient Age:    51 years        BP:           103/70 mmHg Patient  Gender: F               HR:           90 bpm. Exam Location:  Inpatient Procedure: 2D Echo, Cardiac Doppler, Color Doppler and Intracardiac            Opacification Agent Indications:    Abnormal ECG R94.31  History:        Patient has no prior history of Echocardiogram examinations.                 Signs/Symptoms:Obesity; Risk Factors:Prediabetes.  Sonographer:    Luane School RDCS Referring Phys: [id number] Noland Fordyce MATHEWS  Sonographer Comments: Suboptimal apical window. IMPRESSIONS  1. Left ventricular ejection fraction, by estimation, is 65 to 70%. The left ventricle has normal function. The left ventricle has no regional wall motion abnormalities. Left ventricular diastolic parameters were normal.  2. Right ventricular systolic function is normal. The right ventricular size is normal.  3. The mitral valve was not well visualized. Trivial mitral valve regurgitation. No evidence of mitral stenosis.  4. The tricuspid valve is abnormal.  5. The aortic valve is tricuspid. Aortic valve regurgitation is not visualized. No aortic stenosis is present. FINDINGS  Left Ventricle: Left ventricular ejection fraction, by estimation, is 65 to 70%. The left ventricle has normal function. The left ventricle has no regional wall motion abnormalities. Definity contrast agent was given IV to delineate the left ventricular  endocardial borders. The left ventricular internal cavity size was normal in size. There is no left ventricular hypertrophy. Left ventricular diastolic parameters were normal. Right Ventricle: The right ventricular size is normal. Right vetricular wall thickness was not well visualized. Right ventricular systolic function is normal. Left Atrium: Left atrial size was normal in size. Right Atrium: Right atrial size was normal in size. Pericardium: There is no evidence of pericardial effusion. Mitral Valve: The mitral valve was not well visualized. Trivial mitral valve regurgitation. No evidence of mitral valve  stenosis. Tricuspid Valve: The tricuspid valve is abnormal. Tricuspid valve regurgitation is mild . No evidence of tricuspid stenosis. Aortic Valve: The aortic valve is tricuspid. Aortic valve regurgitation is not visualized. No aortic stenosis is present. Aortic valve mean gradient measures 7.8 mmHg. Aortic valve peak gradient measures 17.2 mmHg. Aortic valve area, by VTI measures 1.90  cm. Pulmonic Valve: The pulmonic valve was not well visualized. Pulmonic valve regurgitation is not visualized. No evidence of pulmonic stenosis. Aorta: The aortic root is normal  in size and structure. IAS/Shunts: The interatrial septum was not well visualized.  LEFT VENTRICLE PLAX 2D LVIDd:         5.20 cm   Diastology LVIDs:         3.30 cm   LV e' medial:    8.81 cm/s LV PW:         0.90 cm   LV E/e' medial:  9.4 LV IVS:        0.90 cm   LV e' lateral:   9.57 cm/s LVOT diam:     2.00 cm   LV E/e' lateral: 8.6 LV SV:         62 LV SV Index:   30 LVOT Area:     3.14 cm  RIGHT VENTRICLE RV S prime:     20.20 cm/s TAPSE (M-mode): 2.7 cm LEFT ATRIUM             Index        RIGHT ATRIUM           Index LA diam:        3.60 cm 1.72 cm/m   RA Area:     11.70 cm LA Vol (A2C):   40.2 ml 19.19 ml/m  RA Volume:   27.10 ml  12.93 ml/m LA Vol (A4C):   38.2 ml 18.23 ml/m LA Biplane Vol: 40.3 ml 19.23 ml/m  AORTIC VALVE AV Area (Vmax):    1.76 cm AV Area (Vmean):   1.82 cm AV Area (VTI):     1.90 cm AV Vmax:           207.49 cm/s AV Vmean:          127.892 cm/s AV VTI:            0.328 m AV Peak Grad:      17.2 mmHg AV Mean Grad:      7.8 mmHg LVOT Vmax:         116.51 cm/s LVOT Vmean:        74.185 cm/s LVOT VTI:          0.198 m LVOT/AV VTI ratio: 0.60  AORTA Ao Root diam: 3.10 cm Ao Asc diam:  3.10 cm MITRAL VALVE               TRICUSPID VALVE MV Area (PHT): 4.68 cm    TR Peak grad:   20.6 mmHg MV Decel Time: 162 msec    TR Vmax:        227.00 cm/s MV E velocity: 82.60 cm/s MV A velocity: 67.30 cm/s  SHUNTS MV E/A ratio:  1.23         Systemic VTI:  0.20 m                            Systemic Diam: 2.00 cm Carlyle Dolly MD Electronically signed by Carlyle Dolly MD Signature Date/Time: 06/20/2022/5:10:59 PM    Final    DG Chest 1 View  Result Date: 06/20/2022 CLINICAL DATA:  Shortness of breath and chest pain. EXAM: CHEST  1 VIEW COMPARISON:  11/16/2011 chest radiograph FINDINGS: This is a low volume study with bibasilar atelectasis present. The visualized cardiomediastinal silhouette is unremarkable. There is no evidence of focal airspace disease, pulmonary edema, suspicious pulmonary nodule/mass, pleural effusion, or pneumothorax. No acute bony abnormalities are identified. IMPRESSION: Low volume study with bibasilar atelectasis. Electronically Signed   By: Margarette Canada M.D.   On:  06/20/2022 12:47    Labs:  CBC: Recent Labs    06/14/22 0400 06/16/22 0425 06/17/22 0433 06/20/22 1649  WBC 12.4* 11.6* 9.9 8.1  HGB 8.9* 8.6* 8.9* 9.6*  HCT 28.6* 27.3* 28.7* 30.5*  PLT 473* 477* 517* 477*    COAGS: No results for input(s): "INR", "APTT" in the last 8760 hours.  BMP: Recent Labs    06/14/22 0400 06/16/22 0425 06/17/22 0433 06/20/22 1649  NA 141 139 142 140  K 3.5 3.6 3.5 3.8  CL 104 105 109 109  CO2 '29 28 27 25  '$ GLUCOSE 111* 129* 120* 131*  BUN 5* '9 8 12  '$ CALCIUM 8.1* 8.0* 8.4* 8.8*  CREATININE 0.76 0.67 0.78 0.55  GFRNONAA >60 >60 >60 >60    LIVER FUNCTION TESTS: Recent Labs    06/11/22 0432 06/12/22 0436 06/16/22 0425 06/20/22 1649  BILITOT 1.0 1.1 0.5 0.4  AST '21 15 17 22  '$ ALT '26 23 20 24  '$ ALKPHOS 72 76 87 127*  PROT 5.5* 5.7* 6.0* 7.2  ALBUMIN 2.2* 2.1* 2.2* 2.4*    Assessment and Plan:  History of diverticulitis with multiple abscesses, s/p sigmoid colectomy with end colostomy and takedown of splenic flexure. Pt underwent LUQ fluid collection drain 06/11/22 with Dr. Serafina Royals, IR.   WBC WNL, afebrile 20 cc OP documented 06/21/22  CT abdomen ordered to evaluate drain status  Continue  TID flushes with 5 cc NS. Record output Q shift. Dressing changes QD or PRN if soiled.  Call IR APP or on call IR MD if difficulty flushing or sudden change in drain output.   Repeat imaging/possible drain injection once output < 10 mL/QD (excluding flush material.)   Discharge planning: Please contact IR APP or on call IR MD prior to patient d/c to ensure appropriate follow up plans are in place. Typically patient will follow up with IR clinic 10-14 days post d/c for repeat imaging/possible drain injection. IR scheduler will contact patient with date/time of appointment. Patient will need to flush drain QD with 5 cc NS, record output QD, dressing changes every 2-3 days or earlier if soiled.    IR will continue to follow - please call with questions or concerns.     Electronically Signed: Tyson Alias, NP 06/22/2022, 3:21 PM   I spent a total of 15 Minutes at the the patient's bedside AND on the patient's hospital floor or unit, greater than 50% of which was counseling/coordinating care for LUQ fluid collection drain.

## 2022-06-22 NOTE — TOC Progression Note (Addendum)
Transition of Care [facility]) - Progression Note    Patient Details  Name: Morgan Cline MRN: [id number] Date of Birth: 1971/02/24  [facility] CM/SW Contact  Ross Ludwig, [place] Phone Number: 06/22/2022, 9:53 PM  Clinical Narrative:     CSW informed by Nancy Marus that per patient's insurance company she does not have SNF benefits.  However insurance company said they can make an exception and provide SNF benefits for patient, if a physician completes a letter of intent.    This letter needs to include the following: Background information about patient's case and condition Discuss what the best level of care for her needs What could be the negative outcome if patient is not able to get SNF level of care for example if she would go home instead of SNF what could happen Discuss the wound and care needed  The letter will have to be faxed along with clinical notes showing the wound progression to [facility].  It needs to be faxed to the attn of Cheral Almas, [phone].  Once letter is completed, insurance company can negotiate with [facility] and get her approved for facility placement for 4 weeks pending decision by insurance company's management team.  [place] update attending physician, she will discuss with surgery to determine who will complete the letter of intent.  TOC lead Beverlee Nims to follow up tomorrow with attending physician and insurance company.    CSW spoke to [facility] admissions coordinator Lauren and updated her on the situation for patient.  Per Ander Purpura they will cancel request for CIR approval so authorization for SNF can be started.  TOC to continue to follow patient's progress throughout discharge planning.   Expected Discharge Plan: [place] Barriers to Discharge: No [place] will accept this patient  Expected Discharge Plan and Services Expected Discharge Plan: [place]   Discharge Planning  Services: CM Consult Post Acute Care Choice: Durable Medical Equipment, Home Health   Expected Discharge Date: 06/17/22               DME Arranged: Negative pressure wound device DME Agency: [employer] Date DME Agency Contacted: 06/15/22 Time DME Agency Contacted: 25 Representative spoke with at DME Agency: tracey barrow HH Arranged: RN   Date [place]: 06/15/22       Social Determinants of Health ([place]) Interventions    Readmission Risk Interventions    06/01/2022   11:45 AM  Readmission Risk Prevention Plan  Post Dischage Appt Complete  Medication Screening Complete  Transportation Screening Complete

## 2022-06-22 NOTE — Progress Notes (Signed)
Progress Note  17 Days Post-Op  Subjective: Pt seen with WOC RN. Tolerated VAC change well. She is tolerating diet and having ostomy output. Working on figuring out [place].   Objective: Vital signs in last 24 hours: Temp:  [98 F (36.7 C)-98.2 F (36.8 C)] 98 F (36.7 C) (07/17 0541) Pulse Rate:  [82-103] 95 (07/17 0541) Resp:  [18] 18 (07/16 2013) BP: (107-113)/(72-75) 113/73 (07/17 0541) SpO2:  [97 %-98 %] 97 % (07/17 0541) Weight:  [109.6 kg] 109.6 kg (07/17 0541) Last BM Date : 06/21/22  Intake/Output from previous day: 07/16 0701 - 07/17 0700 In: 687 [P.O.:677; I.V.:10] Out: 250 [Stool:250] Intake/Output this shift: No intake/output data recorded.  PE: General appearance: alert and cooperative GI: soft, appropriately tender, ostomy with some mucocutaneous separation. Open midline wound with healthy granulation tissue, pictured below. IR drain with serous fluid in bulb     Lab Results:  Recent Labs    06/20/22 1649  WBC 8.1  HGB 9.6*  HCT 30.5*  PLT 477*   BMET Recent Labs    06/20/22 1649  NA 140  K 3.8  CL 109  CO2 25  GLUCOSE 131*  BUN 12  CREATININE 0.55  CALCIUM 8.8*   PT/INR No results for input(s): "LABPROT", "INR" in the last 72 hours. CMP     Component Value Date/Time   NA 140 06/20/2022 1649   K 3.8 06/20/2022 1649   CL 109 06/20/2022 1649   CO2 25 06/20/2022 1649   GLUCOSE 131 (H) 06/20/2022 1649   BUN 12 06/20/2022 1649   CREATININE 0.55 06/20/2022 1649   CALCIUM 8.8 (L) 06/20/2022 1649   PROT 7.2 06/20/2022 1649   ALBUMIN 2.4 (L) 06/20/2022 1649   AST 22 06/20/2022 1649   ALT 24 06/20/2022 1649   ALKPHOS 127 (H) 06/20/2022 1649   BILITOT 0.4 06/20/2022 1649   GFRNONAA >60 06/20/2022 1649   GFRAA >90 11/16/2011 1455   Lipase     Component Value Date/Time   LIPASE 23 05/31/2022 2224       Studies/Results: ECHOCARDIOGRAM COMPLETE  Result Date: 06/20/2022    ECHOCARDIOGRAM REPORT   Patient Name:   Morgan Cline  Date of Exam: 06/20/2022 Medical Rec #:  [id number]       Height:       64.0 in Accession #:    [id number]      Weight:       235.0 lb Date of Birth:  1971-07-31       BSA:          2.095 m Patient Age:    84 years        BP:           103/70 mmHg Patient Gender: F               HR:           90 bpm. Exam Location:  Inpatient Procedure: 2D Echo, Cardiac Doppler, Color Doppler and Intracardiac            Opacification Agent Indications:    Abnormal ECG R94.31  History:        Patient has no prior history of Echocardiogram examinations.                 Signs/Symptoms:Obesity; Risk Factors:Prediabetes.  Sonographer:    Luane School RDCS Referring Phys: [id number] Noland Fordyce MATHEWS  Sonographer Comments: Suboptimal apical window. IMPRESSIONS  1. Left ventricular ejection fraction, by estimation, is  65 to 70%. The left ventricle has normal function. The left ventricle has no regional wall motion abnormalities. Left ventricular diastolic parameters were normal.  2. Right ventricular systolic function is normal. The right ventricular size is normal.  3. The mitral valve was not well visualized. Trivial mitral valve regurgitation. No evidence of mitral stenosis.  4. The tricuspid valve is abnormal.  5. The aortic valve is tricuspid. Aortic valve regurgitation is not visualized. No aortic stenosis is present. FINDINGS  Left Ventricle: Left ventricular ejection fraction, by estimation, is 65 to 70%. The left ventricle has normal function. The left ventricle has no regional wall motion abnormalities. Definity contrast agent was given IV to delineate the left ventricular  endocardial borders. The left ventricular internal cavity size was normal in size. There is no left ventricular hypertrophy. Left ventricular diastolic parameters were normal. Right Ventricle: The right ventricular size is normal. Right vetricular wall thickness was not well visualized. Right ventricular systolic function is normal. Left Atrium: Left atrial size  was normal in size. Right Atrium: Right atrial size was normal in size. Pericardium: There is no evidence of pericardial effusion. Mitral Valve: The mitral valve was not well visualized. Trivial mitral valve regurgitation. No evidence of mitral valve stenosis. Tricuspid Valve: The tricuspid valve is abnormal. Tricuspid valve regurgitation is mild . No evidence of tricuspid stenosis. Aortic Valve: The aortic valve is tricuspid. Aortic valve regurgitation is not visualized. No aortic stenosis is present. Aortic valve mean gradient measures 7.8 mmHg. Aortic valve peak gradient measures 17.2 mmHg. Aortic valve area, by VTI measures 1.90  cm. Pulmonic Valve: The pulmonic valve was not well visualized. Pulmonic valve regurgitation is not visualized. No evidence of pulmonic stenosis. Aorta: The aortic root is normal in size and structure. IAS/Shunts: The interatrial septum was not well visualized.  LEFT VENTRICLE PLAX 2D LVIDd:         5.20 cm   Diastology LVIDs:         3.30 cm   LV e' medial:    8.81 cm/s LV PW:         0.90 cm   LV E/e' medial:  9.4 LV IVS:        0.90 cm   LV e' lateral:   9.57 cm/s LVOT diam:     2.00 cm   LV E/e' lateral: 8.6 LV SV:         62 LV SV Index:   30 LVOT Area:     3.14 cm  RIGHT VENTRICLE RV S prime:     20.20 cm/s TAPSE (M-mode): 2.7 cm LEFT ATRIUM             Index        RIGHT ATRIUM           Index LA diam:        3.60 cm 1.72 cm/m   RA Area:     11.70 cm LA Vol (A2C):   40.2 ml 19.19 ml/m  RA Volume:   27.10 ml  12.93 ml/m LA Vol (A4C):   38.2 ml 18.23 ml/m LA Biplane Vol: 40.3 ml 19.23 ml/m  AORTIC VALVE AV Area (Vmax):    1.76 cm AV Area (Vmean):   1.82 cm AV Area (VTI):     1.90 cm AV Vmax:           207.49 cm/s AV Vmean:          127.892 cm/s AV VTI:  0.328 m AV Peak Grad:      17.2 mmHg AV Mean Grad:      7.8 mmHg LVOT Vmax:         116.51 cm/s LVOT Vmean:        74.185 cm/s LVOT VTI:          0.198 m LVOT/AV VTI ratio: 0.60  AORTA Ao Root diam: 3.10 cm Ao  Asc diam:  3.10 cm MITRAL VALVE               TRICUSPID VALVE MV Area (PHT): 4.68 cm    TR Peak grad:   20.6 mmHg MV Decel Time: 162 msec    TR Vmax:        227.00 cm/s MV E velocity: 82.60 cm/s MV A velocity: 67.30 cm/s  SHUNTS MV E/A ratio:  1.23        Systemic VTI:  0.20 m                            Systemic Diam: 2.00 cm Carlyle Dolly MD Electronically signed by Carlyle Dolly MD Signature Date/Time: 06/20/2022/5:10:59 PM    Final     Anti-infectives: Anti-infectives (From admission, onward)    Start     Dose/Rate Route Frequency Ordered Stop   06/03/22 1400  piperacillin-tazobactam (ZOSYN) IVPB 3.375 g        3.375 g 12.5 mL/hr over 240 Minutes Intravenous Every 8 hours 06/03/22 0900 06/16/22 1356   06/02/22 0000  cefTRIAXone (ROCEPHIN) 2 g in sodium chloride 0.9 % 100 mL IVPB  Status:  Discontinued        2 g 200 mL/hr over 30 Minutes Intravenous Every 24 hours 06/01/22 1302 06/03/22 0856   06/01/22 1400  metroNIDAZOLE (FLAGYL) IVPB 500 mg  Status:  Discontinued        500 mg 100 mL/hr over 60 Minutes Intravenous Every 12 hours 06/01/22 1302 06/03/22 0856   06/01/22 1200  cefTRIAXone (ROCEPHIN) 2 g in sodium chloride 0.9 % 100 mL IVPB  Status:  Discontinued        2 g 200 mL/hr over 30 Minutes Intravenous Every 24 hours 06/01/22 0432 06/01/22 0801   06/01/22 0900  Ampicillin-Sulbactam (UNASYN) 3 g in sodium chloride 0.9 % 100 mL IVPB  Status:  Discontinued        3 g 200 mL/hr over 30 Minutes Intravenous Every 6 hours 06/01/22 0801 06/01/22 1302   06/01/22 0030  cefTRIAXone (ROCEPHIN) 1 g in sodium chloride 0.9 % 100 mL IVPB        1 g 200 mL/hr over 30 Minutes Intravenous  Once 06/01/22 0027 06/01/22 0155   06/01/22 0030  metroNIDAZOLE (FLAGYL) IVPB 500 mg  Status:  Discontinued        500 mg 100 mL/hr over 60 Minutes Intravenous Every 12 hours 06/01/22 0027 06/01/22 0801        Assessment/Plan  Perforated diverticulitis with multiple abscesses POD#17 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 Barry Dienes - path consistent with diverticulitis  - abdominal staples removed 7/12. continue vac MWF - tolerating diet and ostomy functioning. Monitor mucocutaneous separation  - WOC following for new ostomy. Outpatient referral to ostomy clinic placed - s/p IR drain 7/6. Culture negative but repeat CT 7/11 showed persistent fluid collection with drain in place. Continue drain for now, outpatient follow up with IR - Patient figuring out dispo still, will  see again Friday if remains admitted. Will ensure surgical follow up is in AVS and that any surgical needs are addressed. IR should ensure that she has drain clinic follow up as well. Please let us know if any surgical concerns arise in the meantime.    ID - zosyn 6/28>>7/11 FEN - soft diet VTE - lovenox Foley - none   LOS: 21 days      Norm Parcel, [facility] Surgery 06/22/2022, 10:49 AM Please see Amion for pager number during day hours 7:00am-4:30pm

## 2022-06-22 NOTE — Progress Notes (Signed)
Occupational Therapy Treatment Patient Details Name: Morgan Cline MRN: [id number] DOB: Oct 13, 1971 Today's Date: 06/22/2022   History of present illness 51 year old female with PMH of morbid obesity, prediabetes and migraine headache returning with worsening abdominal pain due to acute sigmoid diverticulitis despite oral antibiotics.  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   OT comments  Patient was motivated to participate in session even with pain limitations. Patient was educated on LB Dressing with AE for socks. Patient was able to demonstrate understanding with supervision seated in recliner. Patient would continue to benefit from skilled OT services at this time while admitted and after d/c to address noted deficits in order to improve overall safety and independence in ADLs.     Recommendations for follow up therapy are one component of a multi-disciplinary discharge planning process, led by the attending physician.  Recommendations may be updated based on patient status, additional functional criteria and insurance authorization.    Follow Up Recommendations  Acute inpatient rehab (3hours/day)    Assistance Recommended at Discharge Frequent or constant Supervision/Assistance  Patient can return home with the following  A little help with walking and/or transfers;A lot of help with bathing/dressing/bathroom;Assistance with cooking/housework;Help with stairs or ramp for entrance   Equipment Recommendations  BSC/3in1 (bari BSC)    Recommendations for Other Services      Precautions / Restrictions Precautions Precautions: Fall Precaution Comments: colostomy, wound VAC, JP drain on L Restrictions Weight Bearing Restrictions: No       Mobility Bed Mobility               General bed mobility comments: patient was up with PT and returned to recliner on this date.    Transfers                          Balance Overall balance assessment: Needs assistance Sitting-balance support: No upper extremity supported, Feet supported Sitting balance-Leahy Scale: Fair     Standing balance support: Reliant on assistive device for balance, Bilateral upper extremity supported, During functional activity Standing balance-Leahy Scale: Poor                             ADL either performed or assessed with clinical judgement   ADL Overall ADL's : Needs assistance/impaired                       Lower Body Dressing Details (indicate cue type and reason): patient was educated on donning/doffing socks with AE with reacher and sock aid with supervision sitting in chair. Toilet Transfer: Software engineer (2 wheels)   Toileting- Water quality scientist and Hygiene: Moderate assistance;Sit to/from stand Toileting - Clothing Manipulation Details (indicate cue type and reason): patient was educated on toileting buddy. patient reported she would look into getting one.       General ADL Comments: Sit to stand was min guard. Movement very slow and guarded due to pain.    Extremity/Trunk Assessment              Vision       Perception     Praxis      Cognition Arousal/Alertness: Awake/alert Behavior During Therapy: WFL for tasks assessed/performed Overall Cognitive Status: Within Functional Limits for tasks assessed  General Comments: motivated to get better        Exercises      Shoulder Instructions       General Comments      Pertinent Vitals/ Pain       Pain Assessment Pain Assessment: Faces Faces Pain Scale: Hurts whole lot Pain Location: abdomen Pain Descriptors / Indicators: Aching, Grimacing, Operative site guarding, Stabbing, Sharp Pain Intervention(s): Limited activity within patient's tolerance, Monitored during session, Premedicated before session,  Repositioned  Home Living                                          Prior Functioning/Environment              Frequency  Min 2X/week        Progress Toward Goals  OT Goals(current goals can now be found in the care plan section)  Progress towards OT goals: Progressing toward goals     Plan Discharge plan remains appropriate    Co-evaluation                 AM-PAC OT "6 Clicks" Daily Activity     Outcome Measure   Help from another person eating meals?: None Help from another person taking care of personal grooming?: A Little Help from another person toileting, which includes using toliet, bedpan, or urinal?: A Lot Help from another person bathing (including washing, rinsing, drying)?: A Lot Help from another person to put on and taking off regular upper body clothing?: A Little Help from another person to put on and taking off regular lower body clothing?: A Lot 6 Click Score: 16    End of Session Equipment Utilized During Treatment: Rolling walker (2 wheels)  OT Visit Diagnosis: Pain   Activity Tolerance Patient limited by pain   Patient Left in chair;with call bell/phone within reach;with family/visitor present   Nurse Communication Other (comment) (nurse in room during portion of session)        Time: 1610-9604 OT Time Calculation (min): 22 min  Charges: OT General Charges $OT Visit: 1 Visit OT Treatments $Self Care/Home Management : 8-22 mins  [name] OTR/L, MS Acute Rehabilitation Department Office# [phone] Pager# [phone]   Morgan Cline 06/22/2022, 1:07 PM

## 2022-06-22 NOTE — Consult Note (Signed)
Morgan Cline Nurse wound follow up Wound type:Surgical. Seen for NPWT dressing change with CCS PA-C K. Johnson. Photo taken for EHR.  [name] wishes to see patient again with the Morgan Cline nurse on [place]. If the patient is still in house.  Patient received IV Dilaudid and antianxiety medications approximately 15 minutes prior to dressing change.  Measurement:21cm x 5.5cm x 5cm Wound TAV:WPVXY red, areas of bleeding with Granufoam removal at periphery. Stops with gentle pressure held for 2-4 minutes. Drainage (amount, consistency, odor) serosanguinous, small Periwound: intact, mild maceration at umbilicus and deep creases at 3 and 9 o'clock. Dressing procedure/placement/frequency: Drape removed using adhesive remover spray. Two pieces of black foam removed from wound, wound cleansed and surrounding skin patted dry. Skin barrier ring placed at 3 and 9 o'clock in crease. Three pieces of black foam inserted into wound, topped with drape. Dressing attached to 183mHg continuous negative pressure and an immediate seal is achieved. Patient tolerated procedure well.  Dressing is labeled with date and number of wound contact layers beneath drape (3).  Next NPWT dressing change is due on Wednesday, 06/24/22.  Supplies (2 dressing kits and medical adhesive remover spray) are in the room.  WPayetteNurse ostomy follow up Stoma type/location: LLQ oval colostomy with retraction. Stoma still above fascia and functioning. Stomal assessment/size: 1 and 1/4 inch from top to bottom and 1 and 5/8 from side to side. Peristomal assessment: Intact. Treatment options for stomal/peristomal skin: Skin barrier ring and 1-piece convex pouching system Output: brown stool Ostomy pouching: 1pc., 2 and 1/8 inch convex pouching system with skin barrier ring. Education provided: Patient reports she is independent in emptying pouch. She understands that pouching systems are typically changed twice weekly but that hers may require changing three  times a week in coordination with NPWT dressing changes. [place] Yes, previously.  Supplies in room for the next several pouch changes (pouches, rings, odor spray, patterns and stoma measuring guides).  WSteptoenursing team will follow, and will remain available to this patient, the nursing and medical teams.     Thank you for inviting [place] participate in this patient's Plan of Care.  LMaudie Flakes MSN, RN, CNS, [place] CSerita Grammes [employer] FUn[employer] Corporationphone:  [phone]

## 2022-06-22 NOTE — Progress Notes (Signed)
Inpatient Rehab Admissions Coordinator:  Insurance company called and informed AC pt is being worked up for a SNF placement. Confirmed with TOC.   Gayland Curry, [place], [place] Admissions Coordinator [phone]

## 2022-06-23 ENCOUNTER — Inpatient Hospital Stay (HOSPITAL_COMMUNITY): Payer: Federal, State, Local not specified - PPO

## 2022-06-23 DIAGNOSIS — K5720 Diverticulitis of large intestine with perforation and abscess without bleeding: Secondary | ICD-10-CM | POA: Diagnosis not present

## 2022-06-23 LAB — GLUCOSE, CAPILLARY
Glucose-Capillary: 118 mg/dL — ABNORMAL HIGH (ref 70–99)
Glucose-Capillary: 118 mg/dL — ABNORMAL HIGH (ref 70–99)
Glucose-Capillary: 132 mg/dL — ABNORMAL HIGH (ref 70–99)
Glucose-Capillary: 99 mg/dL (ref 70–99)

## 2022-06-23 LAB — CBC
HCT: 30 % — ABNORMAL LOW (ref 36.0–46.0)
Hemoglobin: 9.3 g/dL — ABNORMAL LOW (ref 12.0–15.0)
MCH: 27.5 pg (ref 26.0–34.0)
MCHC: 31 g/dL (ref 30.0–36.0)
MCV: 88.8 fL (ref 80.0–100.0)
Platelets: 400 10*3/uL (ref 150–400)
RBC: 3.38 MIL/uL — ABNORMAL LOW (ref 3.87–5.11)
RDW: 14.9 % (ref 11.5–15.5)
WBC: 6.3 10*3/uL (ref 4.0–10.5)
nRBC: 0 % (ref 0.0–0.2)

## 2022-06-23 MED ORDER — SODIUM CHLORIDE (PF) 0.9 % IJ SOLN
INTRAMUSCULAR | Status: AC
  Filled 2022-06-23: qty 50

## 2022-06-23 MED ORDER — IOHEXOL 300 MG/ML  SOLN
100.0000 mL | Freq: Once | INTRAMUSCULAR | Status: AC | PRN
  Administered 2022-06-23: 100 mL via INTRAVENOUS

## 2022-06-23 NOTE — TOC Progression Note (Signed)
Transition of Care [facility]) - Progression Note    Patient Details  Name: Morgan Cline MRN: [id number] Date of Birth: 09-Jan-1971  [facility] CM/SW Contact  Joaquin Courts, RN Phone Number: 06/23/2022, 1:41 PM  Clinical Narrative:    CM spoke with [place] Plan case manager [phone], regarding flexible benefits for SNF stay for patient.  Insurance requests a letter of intent from MD along with clinical documentation showing wound progression through out hospital stay.  CM faxed all requested information, will await insurance review and determination.   Expected Discharge Plan: [place] Barriers to Discharge: No [place] will accept this patient  Expected Discharge Plan and Services Expected Discharge Plan: [place]   Discharge Planning Services: CM Consult Post Acute Care Choice: Durable Medical Equipment, Home Health   Expected Discharge Date: 06/17/22               DME Arranged: Negative pressure wound device DME Agency: [employer] Date DME Agency Contacted: 06/15/22 Time DME Agency Contacted: 60 Representative spoke with at DME Agency: tracey barrow HH Arranged: RN   Date [place]: 06/15/22       Social Determinants of Health ([place]) Interventions    Readmission Risk Interventions    06/01/2022   11:45 AM  Readmission Risk Prevention Plan  Post Dischage Appt Complete  Medication Screening Complete  Transportation Screening Complete

## 2022-06-23 NOTE — Progress Notes (Addendum)
PROGRESS NOTE    Morgan Cline  NTI:[id number] DOB: 1971/03/21 DOA: 05/31/2022 PCP: [facility]   Brief Narrative: 51 year old female with history of prediabetes, obesity, comes to the hospital with abdominal pain.  She was recently seen in the ED couple days prior to admission with sigmoid diverticulitis, she was given Augmentin and sent home, however returned due to worsening pain.  A CT scan showed worsening perforated diverticulitis with abscess formation.  General surgery was consulted and eventually underwent Hartman's procedure on 6/30.  Assessment & Plan:   Principal Problem:   Perforation of sigmoid colon due to diverticulitis Active Problems:   Obesity, Class III, BMI 40-49.9 (morbid obesity) (HCC)   Prediabetes   Hyponatremia   Hypokalemia   Bandemia   Normocytic anemia    Sigmoid colon perforation due to diverticulitis, multiple abscesses, postop ileus-appreciate general surgery follow-up.  She is status post sigmoid colectomy and end colostomy, takedown of splenic flexure by Dr. Barry Dienes on 6/30.  Postop course complicated by ileus, NG tube, now resolved, NG tube was removed in the interval and she is eating well.  She had persistent WBC elevation and underwent a CT scan of the abdomen and pelvis 7/5 which showed a left-sided fluid collection.  She is status post IR guided drainage on 7/6.  WBC continues to improve, cultures without growth.  Completed Zosyn 06/16/2022 Still waiting on safe disposition  Cir consulted   Active problems  Hyponatremia-resolved Hypokalemia -resolved Thrombocytosis -likely reactive monitor Normocytic Anemia -no bleeding, monitor   Pre-Diabetes-CBGs with good control on Ozempic prior to admission CBG (last 3)  Recent Labs    06/22/22 1617 06/23/22 0004 06/23/22 0801  GLUCAP 121* 118* 99    Tachycardia-ekg normal sinus rhythm Chest xray bibasilar atelectasis Echo normal ejection fraction Low dose metoprolol      Estimated body mass index is 41.47 kg/m as calculated from the following:   Height as of this encounter: '5\' 4"'$  (1.626 m).   Weight as of this encounter: 109.6 kg.  DVT prophylaxis: Lovenox  code Status: Full code Family Communication: None Disposition Plan:  Status is: Inpatient Remains inpatient appropriate because: Await safe DC plan Medically stable to be discharged Consultants:  General surgery  Subjective:   No new events  objective: Vitals:   06/22/22 1544 06/22/22 2005 06/23/22 0426 06/23/22 1309  BP: 104/69 103/67 102/66 101/71  Pulse: 98 97 98 (!) 105  Resp:  '18 18 16  '$ Temp: 97.9 F (36.6 C) 98.4 F (36.9 C) 98.4 F (36.9 C) 98.4 F (36.9 C)  TempSrc: Oral Oral Oral Oral  SpO2: 98% 94% 99% 98%  Weight:      Height:        Intake/Output Summary (Last 24 hours) at 06/23/2022 1533 Last data filed at 06/23/2022 1449 Gross per 24 hour  Intake 725 ml  Output 785 ml  Net -60 ml    Filed Weights   06/01/22 0410 06/05/22 1154 06/22/22 0541  Weight: 115.3 kg 106.6 kg 109.6 kg    Examination:on RA   General exam: Appears in no acute distress  respiratory system: diminished  to auscultation. Respiratory effort normal. Cardiovascular system: S1 & S2 heard, RRR. No JVD, murmurs, rubs, gallops or clicks. No pedal edema. Gastrointestinal system: Abdomen is nondistended, soft and nontender. No organomegaly or masses felt. Normal bowel sounds heard.  Wound VAC in place, drain in place COLOSTOMY WITH STOOL Central nervous system: Alert and oriented. No focal neurological deficits. Extremities: TRACE EDEMA  Skin:  No rashes, lesions or ulcers Psychiatry: Judgement and insight appear normal. Mood & affect appropriate.     Data Reviewed: I have personally reviewed following labs and imaging studies  CBC: Recent Labs  Lab 06/17/22 0433 06/20/22 1649  WBC 9.9 8.1  HGB 8.9* 9.6*  HCT 28.7* 30.5*  MCV 91.1 89.2  PLT 517* 477*    Basic Metabolic  Panel: Recent Labs  Lab 06/17/22 0433 06/20/22 1649  NA 142 140  K 3.5 3.8  CL 109 109  CO2 27 25  GLUCOSE 120* 131*  BUN 8 12  CREATININE 0.78 0.55  CALCIUM 8.4* 8.8*    GFR: Estimated Creatinine Clearance: 104.1 mL/min (by C-G formula based on SCr of 0.55 mg/dL). Liver Function Tests: Recent Labs  Lab 06/20/22 1649  AST 22  ALT 24  ALKPHOS 127*  BILITOT 0.4  PROT 7.2  ALBUMIN 2.4*    No results for input(s): "LIPASE", "AMYLASE" in the last 168 hours. No results for input(s): "AMMONIA" in the last 168 hours. Coagulation Profile: No results for input(s): "INR", "PROTIME" in the last 168 hours. Cardiac Enzymes: No results for input(s): "CKTOTAL", "CKMB", "CKMBINDEX", "TROPONINI" in the last 168 hours. BNP (last 3 results) No results for input(s): "PROBNP" in the last 8760 hours. HbA1C: No results for input(s): "HGBA1C" in the last 72 hours. CBG: Recent Labs  Lab 06/22/22 0024 06/22/22 0743 06/22/22 1617 06/23/22 0004 06/23/22 0801  GLUCAP 121* 111* 121* 118* 99    Lipid Profile: No results for input(s): "CHOL", "HDL", "LDLCALC", "TRIG", "CHOLHDL", "LDLDIRECT" in the last 72 hours. Thyroid Function Tests: No results for input(s): "TSH", "T4TOTAL", "FREET4", "T3FREE", "THYROIDAB" in the last 72 hours. Anemia Panel: No results for input(s): "VITAMINB12", "FOLATE", "FERRITIN", "TIBC", "IRON", "RETICCTPCT" in the last 72 hours. Sepsis Labs: No results for input(s): "PROCALCITON", "LATICACIDVEN" in the last 168 hours.  No results found for this or any previous visit (from the past 240 hour(s)).        Radiology Studies: CT ABDOMEN W CONTRAST  Result Date: 06/23/2022 CLINICAL DATA:  Follow up left upper quadrant fluid collection status post drainage procedure 06/11/2022. EXAM: CT ABDOMEN WITH CONTRAST TECHNIQUE: Multidetector CT imaging of the abdomen was performed using the standard protocol following bolus administration of intravenous contrast.  RADIATION DOSE REDUCTION: This exam was performed according to the departmental dose-optimization program which includes automated exposure control, adjustment of the mA and/or kV according to patient size and/or use of iterative reconstruction technique. CONTRAST:  123m OMNIPAQUE IOHEXOL 300 MG/ML  SOLN COMPARISON:  Abdominopelvic CT 06/16/2022 and 06/10/2022. FINDINGS: Lower chest: The lung bases and dome of the liver are incompletely visualized on this limited examination. Left pleural effusion and bibasilar atelectasis appear mildly improved. Hepatobiliary: The dome of the liver is excluded from this limited examination. The visualized liver appears unremarkable. No evidence of gallstones, gallbladder wall thickening or biliary dilatation. Pancreas: Unremarkable. No pancreatic ductal dilatation or surrounding inflammatory changes. Spleen: Normal in size without focal abnormality. Adrenals/Urinary Tract: Both adrenal glands appear normal. Stable small left renal cysts; no imaging follow-up recommended. No evidence of renal mass, urinary tract calculus or hydronephrosis. Bladder not imaged. Stomach/Bowel: No enteric contrast administered. Prominent ingested material in the stomach. No evidence of bowel wall thickening, significant distention or surrounding inflammation. The appendix appears normal. Diverting colostomy noted in the left lower quadrant. Vascular/Lymphatic: There are no enlarged abdominal lymph nodes. No significant vascular findings. Other: Percutaneous pigtail drain laterally in the peritoneal cavity is unchanged in position. There  is a small amount of residual fluid inferior to the strain, improved from the prior study. This measures 4.0 x 1.5 cm maximally on image 42/2. There is decreased fluid within the lesser sac. No new or enlarging fluid collections are identified. No free intraperitoneal air. There are stable postsurgical changes within the anterior abdominal wall with surrounding  subcutaneous edema. Musculoskeletal: No acute or significant osseous findings. Multilevel spondylosis. IMPRESSION: 1. Interval near complete resolution of fluid within the left paracolic gutter following percutaneous drain placement. 2. Additional scattered intraperitoneal fluid collections have improved, largest in the lesser sac. No new or enlarging fluid collections identified. 3. Improved aeration of the lung bases, incompletely visualized. Electronically Signed   By: Richardean Sale M.D.   On: 06/23/2022 10:45        Scheduled Meds:  acetaminophen  1,000 mg Oral Q6H   enoxaparin (LOVENOX) injection  40 mg Subcutaneous Q24H   feeding supplement  237 mL Oral BID BM   hydrocortisone cream   Topical QID   methocarbamol  500 mg Oral Q6H   metoprolol succinate  12.5 mg Oral Daily   sodium chloride flush  5 mL Intracatheter Q8H   Continuous Infusions:  promethazine (PHENERGAN) injection (IM or IVPB)       LOS: 22 days    Time spent: 15  min  Georgette Shell, MD 06/23/2022, 3:33 PM

## 2022-06-24 ENCOUNTER — Inpatient Hospital Stay (HOSPITAL_COMMUNITY): Payer: Federal, State, Local not specified - PPO

## 2022-06-24 DIAGNOSIS — K5720 Diverticulitis of large intestine with perforation and abscess without bleeding: Secondary | ICD-10-CM | POA: Diagnosis not present

## 2022-06-24 LAB — CBC WITH DIFFERENTIAL/PLATELET
Abs Immature Granulocytes: 0.06 10*3/uL (ref 0.00–0.07)
Basophils Absolute: 0 10*3/uL (ref 0.0–0.1)
Basophils Relative: 1 %
Eosinophils Absolute: 0.4 10*3/uL (ref 0.0–0.5)
Eosinophils Relative: 7 %
HCT: 30.8 % — ABNORMAL LOW (ref 36.0–46.0)
Hemoglobin: 9.4 g/dL — ABNORMAL LOW (ref 12.0–15.0)
Immature Granulocytes: 1 %
Lymphocytes Relative: 28 %
Lymphs Abs: 1.5 10*3/uL (ref 0.7–4.0)
MCH: 28 pg (ref 26.0–34.0)
MCHC: 30.5 g/dL (ref 30.0–36.0)
MCV: 91.7 fL (ref 80.0–100.0)
Monocytes Absolute: 0.7 10*3/uL (ref 0.1–1.0)
Monocytes Relative: 12 %
Neutro Abs: 2.7 10*3/uL (ref 1.7–7.7)
Neutrophils Relative %: 51 %
Platelets: 403 10*3/uL — ABNORMAL HIGH (ref 150–400)
RBC: 3.36 MIL/uL — ABNORMAL LOW (ref 3.87–5.11)
RDW: 15 % (ref 11.5–15.5)
WBC: 5.3 10*3/uL (ref 4.0–10.5)
nRBC: 0 % (ref 0.0–0.2)

## 2022-06-24 LAB — CREATININE, SERUM
Creatinine, Ser: 0.63 mg/dL (ref 0.44–1.00)
GFR, Estimated: >60 mL/min (ref >60)

## 2022-06-24 LAB — BASIC METABOLIC PANEL
Anion gap: 10 (ref 5–15)
BUN: 16 mg/dL (ref 6–20)
CO2: 23 mmol/L (ref 22–32)
Calcium: 8.4 mg/dL — ABNORMAL LOW (ref 8.9–10.3)
Chloride: 102 mmol/L (ref 98–111)
Creatinine, Ser: 0.65 mg/dL (ref 0.44–1.00)
GFR, Estimated: >60 mL/min (ref >60)
Glucose, Bld: 95 mg/dL (ref 70–99)
Potassium: 3.6 mmol/L (ref 3.5–5.1)
Sodium: 135 mmol/L (ref 135–145)

## 2022-06-24 LAB — GLUCOSE, CAPILLARY
Glucose-Capillary: 96 mg/dL (ref 70–99)
Glucose-Capillary: 108 mg/dL — ABNORMAL HIGH (ref 70–99)

## 2022-06-24 MED ORDER — IOHEXOL 300 MG/ML  SOLN
50.0000 mL | Freq: Once | INTRAMUSCULAR | Status: AC | PRN
  Administered 2022-06-24: 10 mL

## 2022-06-24 NOTE — Progress Notes (Signed)
Physical Therapy Treatment Patient Details Name: Morgan Cline MRN: [id number] DOB: 1971-08-04 Today's Date: 06/24/2022   History of Present Illness 51 year old female with PMH of morbid obesity, prediabetes and migraine headache returning with worsening abdominal pain due to acute sigmoid diverticulitis despite oral antibiotics.  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    PT Comments    Patient making slow progress but remains highly motivated. Pt initiated bed mobility and attempted to complete without assist, light assist needed to fully pivot to EOB. Pt ambulated single bout of ~45', VSS, limited by back pain and UE fatigue. EOS reviewed pt's need to rely on LE's for support with walking as well and for RW to be to steady balance and that this will reduce UE fatigue. Will continue to progress as able.   Recommendations for follow up therapy are one component of a multi-disciplinary discharge planning process, led by the attending physician.  Recommendations may be updated based on patient status, additional functional criteria and insurance authorization.  Follow Up Recommendations  Acute inpatient rehab (3hours/day) (difficult to plan for DC as pt has been denied for SNF and CIR)     Assistance Recommended at Discharge Intermittent Supervision/Assistance  Patient can return home with the following A little help with walking and/or transfers;A little help with bathing/dressing/bathroom;Help with stairs or ramp for entrance;Assistance with cooking/housework;Assist for transportation   Equipment Recommendations  None recommended by PT    Recommendations for Other Services       Precautions / Restrictions Precautions Precautions: Fall Precaution Comments: colostomy, wound VAC, JP drain on L Restrictions Weight Bearing Restrictions: No     Mobility  Bed Mobility Overal bed mobility: Needs Assistance Bed  Mobility: Supine to Sit     Supine to sit: Mod assist, HOB elevated     General bed mobility comments: Mod assist with HOB highly elevated, pt reaching to bed rail and initiated bringing legs off EOB. Mod assist to pivot hip with bed pad.    Transfers Overall transfer level: Needs assistance Equipment used: Rolling walker (2 wheels) Transfers: Sit to/from Stand Sit to Stand: Min guard, From elevated surface   Step pivot transfers: Supervision       General transfer comment: increased time, VCs for technique, min guard/assist to power up from moderately elevated EOB.    Ambulation/Gait Ambulation/Gait assistance: +2 safety/equipment, Min assist (chair follow) Gait Distance (Feet): 45 Feet Assistive device: Rolling walker (2 wheels) Gait Pattern/deviations: Step-through pattern, Decreased stride length, Trunk flexed Gait velocity: decr     General Gait Details: pt slow and cautious. limited by pain with trunk flexed. pt required cues to scan ahead for environment and set goals for distance.   Stairs             Wheelchair Mobility    Modified Rankin (Stroke Patients Only)       Balance Overall balance assessment: Needs assistance Sitting-balance support: No upper extremity supported, Feet supported Sitting balance-Leahy Scale: Fair     Standing balance support: Reliant on assistive device for balance, Bilateral upper extremity supported, During functional activity Standing balance-Leahy Scale: Poor                              Cognition Arousal/Alertness: Awake/alert Behavior During Therapy: WFL for tasks assessed/performed Overall Cognitive Status: Within Functional Limits for tasks assessed  General Comments: motivated to get better        Exercises      General Comments        Pertinent Vitals/Pain Pain Assessment Pain Assessment: Faces Faces Pain Scale: Hurts even more Pain  Location: abdomen Pain Descriptors / Indicators: Discomfort, Grimacing Pain Intervention(s): Limited activity within patient's tolerance, Monitored during session, Repositioned    Home Living                          Prior Function            PT Goals (current goals can now be found in the care plan section) Acute Rehab PT Goals Patient Stated Goal: less pain and improve mobility PT Goal Formulation: With patient Time For Goal Achievement: 06/20/22 Potential to Achieve Goals: Good Progress towards PT goals: Progressing toward goals    Frequency    Min 3X/week      PT Plan Current plan remains appropriate    Co-evaluation              AM-PAC PT "6 Clicks" Mobility   Outcome Measure  Help needed turning from your back to your side while in a flat bed without using bedrails?: A Little Help needed moving from lying on your back to sitting on the side of a flat bed without using bedrails?: A Lot Help needed moving to and from a bed to a chair (including a wheelchair)?: A Little Help needed standing up from a chair using your arms (e.g., wheelchair or bedside chair)?: A Little Help needed to walk in hospital room?: A Little Help needed climbing 3-5 steps with a railing? : A Lot 6 Click Score: 16    End of Session Equipment Utilized During Treatment: Gait belt Activity Tolerance: Patient limited by pain;Patient tolerated treatment well Patient left: in chair;with call bell/phone within reach;with chair alarm set Nurse Communication: Mobility status PT Visit Diagnosis: Other abnormalities of gait and mobility (R26.89);Pain     Time: 0626-9485 PT Time Calculation (min) (ACUTE ONLY): 29 min  Charges:  $Gait Training: 8-22 mins $Therapeutic Activity: 8-22 mins                     Verner Mould, DPT Acute Rehabilitation Services Office [phone] Pager [phone]  06/24/22 4:26 PM

## 2022-06-24 NOTE — TOC Progression Note (Signed)
Transition of Care [facility]) - Progression Note    Patient Details  Name: Morgan Cline MRN: [id number] Date of Birth: [birth year]  [facility] CM/SW Contact  Joaquin Courts, RN Phone Number: 06/24/2022, 2:40 PM  Clinical Narrative:    Additional support documents sent to [facility] for review.  CM spoke with [facility] rep and business office to confirm facility can accept patient.  Facility is not able to commit at this time and this case will need regional director review, due to no SNF benefits in policy. CM explained that we are seeking a flexible benefit approval for SNF.  CM also updated [employer] care coordinator regarding this information, [employer] to outreach to facility to discuss contract for placement.   Expected Discharge Plan: [place] Barriers to Discharge: No [place] will accept this patient  Expected Discharge Plan and Services Expected Discharge Plan: [place]   Discharge Planning Services: CM Consult Post Acute Care Choice: Durable Medical Equipment, Home Health   Expected Discharge Date: 06/17/22               DME Arranged: Negative pressure wound device DME Agency: [employer] Date DME Agency Contacted: 06/15/22 Time DME Agency Contacted: 59 Representative spoke with at DME Agency: tracey barrow HH Arranged: RN   Date [place]: 06/15/22       Social Determinants of Health ([place]) Interventions    Readmission Risk Interventions    06/01/2022   11:45 AM  Readmission Risk Prevention Plan  Post Dischage Appt Complete  Medication Screening Complete  Transportation Screening Complete

## 2022-06-24 NOTE — Progress Notes (Signed)
PT Cancellation Note  Patient Details Name: Morgan Cline MRN: [id number] DOB: 10-Sep-1971   Cancelled Treatment:    Reason Eval/Treat Not Completed: Patient at procedure or test/unavailable (down at IR for drain removal)   [name], DPT Acute Rehabilitation Services Office [phone] Pager [phone]  06/24/22 2:49 PM

## 2022-06-24 NOTE — Progress Notes (Signed)
Referring Physician(s): Cornett, T  Supervising Physician: Michaelle Birks  Patient Status:  [facility] - In-pt  Chief Complaint:  Post OP intraabdominal fluid development, s/p LUQ drain placement on 7/6.   Subjective:  Patient laying in bed, NAD.  Reports no complaints today.  Discussed drain injection in the afternoon and possible drain removal.    Allergies: Codeine, Compazine [prochlorperazine], and Pineapple  Medications: Prior to Admission medications   Medication Sig Start Date End Date Taking? Authorizing Provider  amoxicillin-clavulanate (AUGMENTIN) 875-125 MG tablet Take 1 tablet by mouth every 12 (twelve) hours. 05/29/22  Yes Jeanell Sparrow, DO  Aspirin-Salicylamide-Caffeine (BC HEADACHE POWDER PO) Take 1 packet by mouth daily as needed (headaches).   Yes [provider]  diphenhydramine-acetaminophen (TYLENOL PM) 25-500 MG TABS tablet Take 1 tablet by mouth at bedtime as needed (sleep).   Yes [provider]  Fiber Select Gummies CHEW Chew 3 tablets by mouth at bedtime.   Yes [provider]  HYDROcodone-acetaminophen (NORCO) 5-325 MG tablet Take 1 tablet by mouth every 6 (six) hours as needed for moderate pain. 05/29/22  Yes Wynona Dove A, DO  ibuprofen (ADVIL) 600 MG tablet Take 1 tablet (600 mg total) by mouth every 6 (six) hours as needed. Patient taking differently: Take 600 mg by mouth every 6 (six) hours as needed (pain). 05/29/22  Yes Jeanell Sparrow, DO  levonorgestrel (MIRENA) 20 MCG/24HR IUD 1 each by Intrauterine route once. Implanted September 2020   Yes [provider]  ondansetron (ZOFRAN) 4 MG tablet Take 1 tablet (4 mg total) by mouth every 4 (four) hours as needed for nausea or vomiting. 05/29/22  Yes Jeanell Sparrow, DO  Semaglutide, 1 MG/DOSE, (OZEMPIC, 1 MG/DOSE,) 4 MG/3ML SOPN Inject 1 mg into the skin every Sunday.   Yes [provider]  acetaminophen (TYLENOL) 500 MG tablet Take 2 tablets (1,000 mg total) by  mouth every 6 (six) hours. 06/17/22   Georgette Shell, MD  hydrocortisone cream 0.5 % Apply topically 4 (four) times daily. 06/17/22   Georgette Shell, MD  methocarbamol (ROBAXIN) 500 MG tablet Take 1 tablet (500 mg total) by mouth every 6 (six) hours. 06/17/22   Georgette Shell, MD  oxyCODONE (OXY IR/ROXICODONE) 5 MG immediate release tablet Take 1-2 tablets (5-10 mg total) by mouth every 6 (six) hours as needed for moderate pain, severe pain or breakthrough pain. 06/17/22   Georgette Shell, MD     Vital Signs: BP 109/72 (BP Location: Right Arm)   Pulse 97   Temp 98.8 F (37.1 C) (Oral)   Resp 16   Ht '5\' 4"'$  (1.626 m)   Wt 241 lb 10 oz (109.6 kg)   LMP 06/09/2022   SpO2 98%   BMI 41.47 kg/m   Physical Exam Vitals reviewed.  Constitutional:      General: She is not in acute distress.    Appearance: She is not ill-appearing.  Eyes:     Extraocular Movements: Extraocular movements intact.     Pupils: Pupils are equal, round, and reactive to light.  Cardiovascular:     Pulses: Normal pulses.  Pulmonary:     Effort: Pulmonary effort is normal. No respiratory distress.  Abdominal:     Comments: LUQ drain flushes with some force, does not aspirate. Site unremarkable with sutures/statlock in place. Scant serosanguineous OP in JP. Dressing C/D/I.    Skin:    General: Skin is warm and dry.  Neurological:  Mental Status: She is alert and oriented to person, place, and time.  Psychiatric:        Mood and Affect: Mood normal.        Behavior: Behavior normal.        Thought Content: Thought content normal.        Judgment: Judgment normal.     Imaging: CT ABDOMEN W CONTRAST  Result Date: 06/23/2022 CLINICAL DATA:  Follow up left upper quadrant fluid collection status post drainage procedure 06/11/2022. EXAM: CT ABDOMEN WITH CONTRAST TECHNIQUE: Multidetector CT imaging of the abdomen was performed using the standard protocol following bolus administration of  intravenous contrast. RADIATION DOSE REDUCTION: This exam was performed according to the departmental dose-optimization program which includes automated exposure control, adjustment of the mA and/or kV according to patient size and/or use of iterative reconstruction technique. CONTRAST:  171m OMNIPAQUE IOHEXOL 300 MG/ML  SOLN COMPARISON:  Abdominopelvic CT 06/16/2022 and 06/10/2022. FINDINGS: Lower chest: The lung bases and dome of the liver are incompletely visualized on this limited examination. Left pleural effusion and bibasilar atelectasis appear mildly improved. Hepatobiliary: The dome of the liver is excluded from this limited examination. The visualized liver appears unremarkable. No evidence of gallstones, gallbladder wall thickening or biliary dilatation. Pancreas: Unremarkable. No pancreatic ductal dilatation or surrounding inflammatory changes. Spleen: Normal in size without focal abnormality. Adrenals/Urinary Tract: Both adrenal glands appear normal. Stable small left renal cysts; no imaging follow-up recommended. No evidence of renal mass, urinary tract calculus or hydronephrosis. Bladder not imaged. Stomach/Bowel: No enteric contrast administered. Prominent ingested material in the stomach. No evidence of bowel wall thickening, significant distention or surrounding inflammation. The appendix appears normal. Diverting colostomy noted in the left lower quadrant. Vascular/Lymphatic: There are no enlarged abdominal lymph nodes. No significant vascular findings. Other: Percutaneous pigtail drain laterally in the peritoneal cavity is unchanged in position. There is a small amount of residual fluid inferior to the strain, improved from the prior study. This measures 4.0 x 1.5 cm maximally on image 42/2. There is decreased fluid within the lesser sac. No new or enlarging fluid collections are identified. No free intraperitoneal air. There are stable postsurgical changes within the anterior abdominal wall  with surrounding subcutaneous edema. Musculoskeletal: No acute or significant osseous findings. Multilevel spondylosis. IMPRESSION: 1. Interval near complete resolution of fluid within the left paracolic gutter following percutaneous drain placement. 2. Additional scattered intraperitoneal fluid collections have improved, largest in the lesser sac. No new or enlarging fluid collections identified. 3. Improved aeration of the lung bases, incompletely visualized. Electronically Signed   By: WRichardean SaleM.D.   On: 06/23/2022 10:45    Labs:  CBC: Recent Labs    06/17/22 0433 06/20/22 1649 06/23/22 2040 06/24/22 0426  WBC 9.9 8.1 6.3 5.3  HGB 8.9* 9.6* 9.3* 9.4*  HCT 28.7* 30.5* 30.0* 30.8*  PLT 517* 477* 400 403*     COAGS: No results for input(s): "INR", "APTT" in the last 8760 hours.  BMP: Recent Labs    06/16/22 0425 06/17/22 0433 06/20/22 1649 06/24/22 0426  NA 139 142 140 135  K 3.6 3.5 3.8 3.6  CL 105 109 109 102  CO2 '28 27 25 23  '$ GLUCOSE 129* 120* 131* 95  BUN '9 8 12 16  '$ CALCIUM 8.0* 8.4* 8.8* 8.4*  CREATININE 0.67 0.78 0.55 0.65  0.63  GFRNONAA >60 >60 >60 >60  >60     LIVER FUNCTION TESTS: Recent Labs    06/11/22 0432 06/12/22 0436 06/16/22  0425 06/20/22 1649  BILITOT 1.0 1.1 0.5 0.4  AST '21 15 17 22  '$ ALT '26 23 20 24  '$ ALKPHOS 72 76 87 127*  PROT 5.5* 5.7* 6.0* 7.2  ALBUMIN 2.2* 2.1* 2.2* 2.4*     Assessment and Plan:  History of diverticulitis with multiple abscesses, s/p sigmoid colectomy with end colostomy and takedown of splenic flexure. Pt underwent LUQ fluid collection drain 06/11/22 with Dr. Serafina Royals, IR.   Flushing with resistance x 3 days, OP has been serosanguinous, f/u CT yesterday showed near complete resolution of the LUQ fluid collection.  Discussed with Dr. Maryelizabeth Kaufmann, will perform drain injection.   Drain injection performed in the afternoon and reviewed by Dr. Gerhard Perches, negative for fistula.   Drain was removed  without difficulty,  patient tolerated well.  Dressing was placed.   Routine wound care, LUQ dressing change as needed.  No further f/u with IR needed, please call IR for questions and concerns.     Electronically Signed: Tera Mater, PA-C 06/24/2022, 3:01 PM   I spent a total of 15 Minutes at the the patient's bedside AND on the patient's hospital floor or unit, greater than 50% of which was counseling/coordinating care for LUQ fluid collection drain.

## 2022-06-24 NOTE — Progress Notes (Signed)
OT Cancellation Note  Patient Details Name: Morgan Cline MRN: [id number] DOB: 04-25-1971   Cancelled Treatment:    Reason Eval/Treat Not Completed: Patient at procedure or test/ unavailable  Morrissa Shein L Jacquese Cassarino 06/24/2022, 3:11 PM

## 2022-06-24 NOTE — Progress Notes (Signed)
19 Days Post-Op   Subjective/Chief Complaint: Has some incisional pain but otherwise ok Had some blood streaking in ostomy bag per report Remains hemodynamically stable   Objective: Vital signs in last 24 hours: Temp:  [98.2 F (36.8 C)-99.1 F (37.3 C)] 98.2 F (36.8 C) (07/19 0551) Pulse Rate:  [99-105] 99 (07/19 0551) Resp:  [16-20] 20 (07/19 0551) BP: (101-131)/(69-83) 105/69 (07/19 0551) SpO2:  [93 %-98 %] 98 % (07/19 0551) Last BM Date : 06/21/22  Intake/Output from previous day: 07/18 0701 - 07/19 0700 In: 490 [P.O.:480; I.V.:5] Out: 808 [Urine:600; Drains:8; Stool:200] Intake/Output this shift: No intake/output data recorded.  Exam: Awake and alert Abdomen soft, VAC in place, ostomy covered in normal stool Drain serosang  Lab Results:  Recent Labs    06/23/22 2040  WBC 6.3  HGB 9.3*  HCT 30.0*  PLT 400   BMET Recent Labs    06/24/22 0426  CREATININE 0.63   PT/INR No results for input(s): "LABPROT", "INR" in the last 72 hours. ABG No results for input(s): "PHART", "HCO3" in the last 72 hours.  Invalid input(s): "PCO2", "PO2"  Studies/Results: CT ABDOMEN W CONTRAST  Result Date: 06/23/2022 CLINICAL DATA:  Follow up left upper quadrant fluid collection status post drainage procedure 06/11/2022. EXAM: CT ABDOMEN WITH CONTRAST TECHNIQUE: Multidetector CT imaging of the abdomen was performed using the standard protocol following bolus administration of intravenous contrast. RADIATION DOSE REDUCTION: This exam was performed according to the departmental dose-optimization program which includes automated exposure control, adjustment of the mA and/or kV according to patient size and/or use of iterative reconstruction technique. CONTRAST:  151m OMNIPAQUE IOHEXOL 300 MG/ML  SOLN COMPARISON:  Abdominopelvic CT 06/16/2022 and 06/10/2022. FINDINGS: Lower chest: The lung bases and dome of the liver are incompletely visualized on this limited examination. Left  pleural effusion and bibasilar atelectasis appear mildly improved. Hepatobiliary: The dome of the liver is excluded from this limited examination. The visualized liver appears unremarkable. No evidence of gallstones, gallbladder wall thickening or biliary dilatation. Pancreas: Unremarkable. No pancreatic ductal dilatation or surrounding inflammatory changes. Spleen: Normal in size without focal abnormality. Adrenals/Urinary Tract: Both adrenal glands appear normal. Stable small left renal cysts; no imaging follow-up recommended. No evidence of renal mass, urinary tract calculus or hydronephrosis. Bladder not imaged. Stomach/Bowel: No enteric contrast administered. Prominent ingested material in the stomach. No evidence of bowel wall thickening, significant distention or surrounding inflammation. The appendix appears normal. Diverting colostomy noted in the left lower quadrant. Vascular/Lymphatic: There are no enlarged abdominal lymph nodes. No significant vascular findings. Other: Percutaneous pigtail drain laterally in the peritoneal cavity is unchanged in position. There is a small amount of residual fluid inferior to the strain, improved from the prior study. This measures 4.0 x 1.5 cm maximally on image 42/2. There is decreased fluid within the lesser sac. No new or enlarging fluid collections are identified. No free intraperitoneal air. There are stable postsurgical changes within the anterior abdominal wall with surrounding subcutaneous edema. Musculoskeletal: No acute or significant osseous findings. Multilevel spondylosis. IMPRESSION: 1. Interval near complete resolution of fluid within the left paracolic gutter following percutaneous drain placement. 2. Additional scattered intraperitoneal fluid collections have improved, largest in the lesser sac. No new or enlarging fluid collections identified. 3. Improved aeration of the lung bases, incompletely visualized. Electronically Signed   By: WRichardean Sale M.D.   On: 06/23/2022 10:45    Anti-infectives: Anti-infectives (From admission, onward)    Start     Dose/Rate Route  Frequency Ordered Stop   06/03/22 1400  piperacillin-tazobactam (ZOSYN) IVPB 3.375 g        3.375 g 12.5 mL/hr over 240 Minutes Intravenous Every 8 hours 06/03/22 0900 06/16/22 1356   06/02/22 0000  cefTRIAXone (ROCEPHIN) 2 g in sodium chloride 0.9 % 100 mL IVPB  Status:  Discontinued        2 g 200 mL/hr over 30 Minutes Intravenous Every 24 hours 06/01/22 1302 06/03/22 0856   06/01/22 1400  metroNIDAZOLE (FLAGYL) IVPB 500 mg  Status:  Discontinued        500 mg 100 mL/hr over 60 Minutes Intravenous Every 12 hours 06/01/22 1302 06/03/22 0856   06/01/22 1200  cefTRIAXone (ROCEPHIN) 2 g in sodium chloride 0.9 % 100 mL IVPB  Status:  Discontinued        2 g 200 mL/hr over 30 Minutes Intravenous Every 24 hours 06/01/22 0432 06/01/22 0801   06/01/22 0900  Ampicillin-Sulbactam (UNASYN) 3 g in sodium chloride 0.9 % 100 mL IVPB  Status:  Discontinued        3 g 200 mL/hr over 30 Minutes Intravenous Every 6 hours 06/01/22 0801 06/01/22 1302   06/01/22 0030  cefTRIAXone (ROCEPHIN) 1 g in sodium chloride 0.9 % 100 mL IVPB        1 g 200 mL/hr over 30 Minutes Intravenous  Once 06/01/22 0027 06/01/22 0155   06/01/22 0030  metroNIDAZOLE (FLAGYL) IVPB 500 mg  Status:  Discontinued        500 mg 100 mL/hr over 60 Minutes Intravenous Every 12 hours 06/01/22 0027 06/01/22 0801       Assessment/Plan:   Perforated diverticulitis with multiple abscesses 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 Barry Dienes   CT scan yesterday improved with almost total resolution of left sided fluid collection Hgb stable, do not suspect GI bleed.  Blood in bag likely came from granulation tissue at edge of ostomy. Continue wound care  Suspect IR will be able to remove the drain   Morgan Cline 06/24/2022

## 2022-06-24 NOTE — Consult Note (Addendum)
Reserve Nurse wound follow up Vac dressing changed.  Pt was medicated for pain prior to the procedure and tolerated with minimal amt discomfort.  Removed 3 pieces black foam.  Wound is beefy red with small amt bloody drainage upon sponge removal.  Used adhesive remover spay for drape and moistened with NS to assist with removal. Applied barrier ring pieces to wound edges located in significant left and right skin creases surrounding wound edges. Applied one piece foam back into wound. Cont suction on at 160m. WOC will plan to change dressing again on Fri if patient is still in the hospital at that time. Pt is awaiting discharge disposition and placement.  Colostomy pouch was changed Monday.  It is intact with a good seal and mod amt brown liquid stool.  Pt states she has been assisting with emptying. Declines offer for pouch change and teaching session today. WBallvilleteam will plan to change again on Fri.  5 sets of barrier rings (Kellie Simmering# [phone] and convex pouches (Kellie Simmering# [phone] at the bedside for use or upon discharge.  Pl[facility] on [facility] previously.  DJulien GirtMSN, RN, [place]

## 2022-06-24 NOTE — Progress Notes (Signed)
PROGRESS NOTE    Morgan Cline  CWC:[id number] DOB: Apr 28, 1971 DOA: 05/31/2022 PCP: [facility]   Brief Narrative: 51 year old female with history of prediabetes, obesity, comes to the hospital with abdominal pain.  She was recently seen in the ED couple days prior to admission with sigmoid diverticulitis, she was given Augmentin and sent home, however returned due to worsening pain.  A CT scan showed worsening perforated diverticulitis with abscess formation.  General surgery was consulted and eventually underwent Hartman's procedure on 6/30.  Assessment & Plan:   Principal Problem:   Perforation of sigmoid colon due to diverticulitis Active Problems:   Obesity, Class III, BMI 40-49.9 (morbid obesity) (HCC)   Prediabetes   Hyponatremia   Hypokalemia   Bandemia   Normocytic anemia    Sigmoid colon perforation due to diverticulitis, multiple abscesses, postop ileus-appreciate general surgery follow-up.  She is status post sigmoid colectomy and end colostomy, takedown of splenic flexure by Dr. Barry Dienes on 6/30.  Postop course complicated by ileus, NG tube, now resolved, NG tube was removed in the interval and she is eating well.  She had persistent WBC elevation and underwent a CT scan of the abdomen and pelvis 7/5 which showed a left-sided fluid collection.  She is status post IR guided drainage on 7/6.  WBC continues to improve, cultures without growth.  Completed Zosyn 06/16/2022 Still waiting on safe disposition  Cir consulted   Active problems  Hyponatremia-resolved Hypokalemia -resolved Thrombocytosis -likely reactive monitor Normocytic Anemia -no bleeding, monitor   Pre-Diabetes-CBGs with good control on Ozempic prior to admission CBG (last 3)  Recent Labs    06/23/22 1730 06/23/22 2352 06/24/22 0715  GLUCAP 118* 132* 96    Tachycardia-ekg normal sinus rhythm Chest xray bibasilar atelectasis Echo normal ejection fraction Low dose metoprolol      Estimated body mass index is 41.47 kg/m as calculated from the following:   Height as of this encounter: '5\' 4"'$  (1.626 m).   Weight as of this encounter: 109.6 kg.  DVT prophylaxis: Lovenox  code Status: Full code Family Communication: None Disposition Plan:  Status is: Inpatient Remains inpatient appropriate because: Await safe DC plan Medically stable to be discharged Consultants:  General surgery  Subjective: Patient seen and examined.  She has no complaints.   objective: Vitals:   06/23/22 0426 06/23/22 1309 06/23/22 2050 06/24/22 0551  BP: 102/66 101/71 131/83 105/69  Pulse: 98 (!) 105 (!) 103 99  Resp: '18 16 20 20  '$ Temp: 98.4 F (36.9 C) 98.4 F (36.9 C) 99.1 F (37.3 C) 98.2 F (36.8 C)  TempSrc: Oral Oral Oral Oral  SpO2: 99% 98% 93% 98%  Weight:      Height:        Intake/Output Summary (Last 24 hours) at 06/24/2022 1056 Last data filed at 06/24/2022 0343 Gross per 24 hour  Intake 370 ml  Output 558 ml  Net -188 ml    Filed Weights   06/01/22 0410 06/05/22 1154 06/22/22 0541  Weight: 115.3 kg 106.6 kg 109.6 kg    Examination:on RA  General exam: Appears calm and comfortable  Respiratory system: Clear to auscultation. Respiratory effort normal. Cardiovascular system: S1 & S2 heard, RRR. No JVD, murmurs, rubs, gallops or clicks. No pedal edema. Gastrointestinal system: Abdomen is nondistended, soft and nontender. No organomegaly or masses felt. Normal bowel sounds heard.  Wound VAC attached to the abdomen. Central nervous system: Alert and oriented. No focal neurological deficits. Extremities: Symmetric 5 x 5 power.  Skin: No rashes, lesions or ulcers.  Psychiatry: Judgement and insight appear normal. Mood & affect appropriate.    Data Reviewed: I have personally reviewed following labs and imaging studies  CBC: Recent Labs  Lab 06/20/22 1649 06/23/22 2040 06/24/22 0426  WBC 8.1 6.3 5.3  NEUTROABS  --   --  2.7  HGB 9.6* 9.3* 9.4*  HCT  30.5* 30.0* 30.8*  MCV 89.2 88.8 91.7  PLT 477* 400 403*    Basic Metabolic Panel: Recent Labs  Lab 06/20/22 1649 06/24/22 0426  NA 140 135  K 3.8 3.6  CL 109 102  CO2 25 23  GLUCOSE 131* 95  BUN 12 16  CREATININE 0.55 0.65  0.63  CALCIUM 8.8* 8.4*    GFR: Estimated Creatinine Clearance: 104.1 mL/min (by C-G formula based on SCr of 0.63 mg/dL). Liver Function Tests: Recent Labs  Lab 06/20/22 1649  AST 22  ALT 24  ALKPHOS 127*  BILITOT 0.4  PROT 7.2  ALBUMIN 2.4*    No results for input(s): "LIPASE", "AMYLASE" in the last 168 hours. No results for input(s): "AMMONIA" in the last 168 hours. Coagulation Profile: No results for input(s): "INR", "PROTIME" in the last 168 hours. Cardiac Enzymes: No results for input(s): "CKTOTAL", "CKMB", "CKMBINDEX", "TROPONINI" in the last 168 hours. BNP (last 3 results) No results for input(s): "PROBNP" in the last 8760 hours. HbA1C: No results for input(s): "HGBA1C" in the last 72 hours. CBG: Recent Labs  Lab 06/23/22 0004 06/23/22 0801 06/23/22 1730 06/23/22 2352 06/24/22 0715  GLUCAP 118* 99 118* 132* 96    Lipid Profile: No results for input(s): "CHOL", "HDL", "LDLCALC", "TRIG", "CHOLHDL", "LDLDIRECT" in the last 72 hours. Thyroid Function Tests: No results for input(s): "TSH", "T4TOTAL", "FREET4", "T3FREE", "THYROIDAB" in the last 72 hours. Anemia Panel: No results for input(s): "VITAMINB12", "FOLATE", "FERRITIN", "TIBC", "IRON", "RETICCTPCT" in the last 72 hours. Sepsis Labs: No results for input(s): "PROCALCITON", "LATICACIDVEN" in the last 168 hours.  No results found for this or any previous visit (from the past 240 hour(s)).        Radiology Studies: CT ABDOMEN W CONTRAST  Result Date: 06/23/2022 CLINICAL DATA:  Follow up left upper quadrant fluid collection status post drainage procedure 06/11/2022. EXAM: CT ABDOMEN WITH CONTRAST TECHNIQUE: Multidetector CT imaging of the abdomen was performed using  the standard protocol following bolus administration of intravenous contrast. RADIATION DOSE REDUCTION: This exam was performed according to the departmental dose-optimization program which includes automated exposure control, adjustment of the mA and/or kV according to patient size and/or use of iterative reconstruction technique. CONTRAST:  159m OMNIPAQUE IOHEXOL 300 MG/ML  SOLN COMPARISON:  Abdominopelvic CT 06/16/2022 and 06/10/2022. FINDINGS: Lower chest: The lung bases and dome of the liver are incompletely visualized on this limited examination. Left pleural effusion and bibasilar atelectasis appear mildly improved. Hepatobiliary: The dome of the liver is excluded from this limited examination. The visualized liver appears unremarkable. No evidence of gallstones, gallbladder wall thickening or biliary dilatation. Pancreas: Unremarkable. No pancreatic ductal dilatation or surrounding inflammatory changes. Spleen: Normal in size without focal abnormality. Adrenals/Urinary Tract: Both adrenal glands appear normal. Stable small left renal cysts; no imaging follow-up recommended. No evidence of renal mass, urinary tract calculus or hydronephrosis. Bladder not imaged. Stomach/Bowel: No enteric contrast administered. Prominent ingested material in the stomach. No evidence of bowel wall thickening, significant distention or surrounding inflammation. The appendix appears normal. Diverting colostomy noted in the left lower quadrant. Vascular/Lymphatic: There are no enlarged abdominal lymph  nodes. No significant vascular findings. Other: Percutaneous pigtail drain laterally in the peritoneal cavity is unchanged in position. There is a small amount of residual fluid inferior to the strain, improved from the prior study. This measures 4.0 x 1.5 cm maximally on image 42/2. There is decreased fluid within the lesser sac. No new or enlarging fluid collections are identified. No free intraperitoneal air. There are stable  postsurgical changes within the anterior abdominal wall with surrounding subcutaneous edema. Musculoskeletal: No acute or significant osseous findings. Multilevel spondylosis. IMPRESSION: 1. Interval near complete resolution of fluid within the left paracolic gutter following percutaneous drain placement. 2. Additional scattered intraperitoneal fluid collections have improved, largest in the lesser sac. No new or enlarging fluid collections identified. 3. Improved aeration of the lung bases, incompletely visualized. Electronically Signed   By: Richardean Sale M.D.   On: 06/23/2022 10:45        Scheduled Meds:  acetaminophen  1,000 mg Oral Q6H   enoxaparin (LOVENOX) injection  40 mg Subcutaneous Q24H   feeding supplement  237 mL Oral BID BM   hydrocortisone cream   Topical QID   methocarbamol  500 mg Oral Q6H   metoprolol succinate  12.5 mg Oral Daily   sodium chloride flush  5 mL Intracatheter Q8H   Continuous Infusions:  promethazine (PHENERGAN) injection (IM or IVPB)       LOS: 23 days   Darliss Cheney, MD 06/24/2022, 10:56 AM

## 2022-06-25 DIAGNOSIS — K5720 Diverticulitis of large intestine with perforation and abscess without bleeding: Secondary | ICD-10-CM | POA: Diagnosis not present

## 2022-06-25 LAB — GLUCOSE, CAPILLARY
Glucose-Capillary: 104 mg/dL — ABNORMAL HIGH (ref 70–99)
Glucose-Capillary: 122 mg/dL — ABNORMAL HIGH (ref 70–99)
Glucose-Capillary: 138 mg/dL — ABNORMAL HIGH (ref 70–99)

## 2022-06-25 NOTE — Progress Notes (Signed)
PROGRESS NOTE    Morgan Cline  MLY:[id number] DOB: 1971/11/10 DOA: 05/31/2022 [facility]CP: [facility]   Brief Narrative: 51 year old female with history of prediabetes, obesity, comes to the hospital with abdominal pain.  She was recently seen in the ED couple days prior to admission with sigmoid diverticulitis, she was given Augmentin and sent home, however returned due to worsening pain.  A CT scan showed worsening perforated diverticulitis with abscess formation.  General surgery was consulted and eventually underwent Hartman's procedure on 6/30.  Assessment & Plan:   Principal Problem:   Perforation of sigmoid colon due to diverticulitis Active Problems:   Obesity, Class III, BMI 40-49.9 (morbid obesity) (HCC)   Prediabetes   Hyponatremia   Hypokalemia   Bandemia   Normocytic anemia    Sigmoid colon perforation due to diverticulitis, multiple abscesses, postop ileus-appreciate general surgery follow-up.  She is status post sigmoid colectomy and end colostomy, takedown of splenic flexure by Dr. Barry Dienes on 6/30.  Postop course complicated by ileus, NG tube, now resolved, NG tube was removed in the interval and she is eating well.  She had persistent WBC elevation and underwent a CT scan of the abdomen and pelvis 7/5 which showed a left-sided fluid collection.  She is status post IR guided drainage on 7/6.  WBC continues to improve, cultures without growth.  Completed Zosyn 06/16/2022.  Drain removed by IR on 06/24/2022.  Wound care on board for abdominal wound.  She has been cleared by general surgery for discharge. Still waiting on safe disposition to SNF.  [place] working.   Active problems  Hyponatremia-resolved Hypokalemia -resolved Thrombocytosis -likely reactive monitor Normocytic Anemia -no bleeding, monitor   Pre-Diabetes-CBGs with good control on Ozempic prior to admission CBG (last 3)  Recent Labs    06/24/22 1617 06/25/22 0006 06/25/22 0728  GLUCAP 108* 122* 104*     Tachycardia-ekg normal sinus rhythm Chest xray bibasilar atelectasis Echo normal ejection fraction Low dose metoprolol     Estimated body mass index is 41.47 kg/m as calculated from the following:   Height as of this encounter: '5\' 4"'$  (1.626 m).   Weight as of this encounter: 109.6 kg.  DVT prophylaxis: Lovenox  code Status: Full code Family Communication: None Disposition Plan:  Status is: Inpatient Remains inpatient appropriate because: Await safe DC plan to SNF, Medically stable to be discharged Consultants:  General surgery  Subjective:  Seen and examined, she has no complaints.  She is just frustrated due to the fact that insurance companies are taking a long time and asking too much to approve for her to go to SNF.  objective: Vitals:   06/24/22 0551 06/24/22 1353 06/24/22 2029 06/25/22 0512  BP: 105/69 109/72 106/67 95/68  Pulse: 99 97 100 93  Resp: '20 16 20 20  '$ Temp: 98.2 F (36.8 C) 98.8 F (37.1 C) 98.6 F (37 C) 98.3 F (36.8 C)  TempSrc: Oral Oral Oral Oral  SpO2: 98% 98% 97% 97%  Weight:      Height:        Intake/Output Summary (Last 24 hours) at 06/25/2022 1252 Last data filed at 06/25/2022 0900 Gross per 24 hour  Intake 1325 ml  Output --  Net 1325 ml    Filed Weights   06/01/22 0410 06/05/22 1154 06/22/22 0541  Weight: 115.3 kg 106.6 kg 109.6 kg    Examination:on RA  General exam: Appears calm and comfortable  Respiratory system: Clear to auscultation. Respiratory effort normal. Cardiovascular system: S1 &  S2 heard, RRR. No JVD, murmurs, rubs, gallops or clicks. No pedal edema. Gastrointestinal system: Abdomen is nondistended, soft and nontender. No organomegaly or masses felt. Normal bowel sounds heard.  Wound VAC attached to the abdomen. Central nervous system: Alert and oriented. No focal neurological deficits. Extremities: Symmetric 5 x 5 power. Skin: No rashes, lesions or ulcers.  Psychiatry: Judgement and insight appear normal.  Mood & affect appropriate.    Data Reviewed: I have personally reviewed following labs and imaging studies  CBC: Recent Labs  Lab 06/20/22 1649 06/23/22 2040 06/24/22 0426  WBC 8.1 6.3 5.3  NEUTROABS  --   --  2.7  HGB 9.6* 9.3* 9.4*  HCT 30.5* 30.0* 30.8*  MCV 89.2 88.8 91.7  PLT 477* 400 403*    Basic Metabolic Panel: Recent Labs  Lab 06/20/22 1649 06/24/22 0426  NA 140 135  K 3.8 3.6  CL 109 102  CO2 25 23  GLUCOSE 131* 95  BUN 12 16  CREATININE 0.55 0.65  0.63  CALCIUM 8.8* 8.4*    GFR: Estimated Creatinine Clearance: 104.1 mL/min (by C-G formula based on SCr of 0.63 mg/dL). Liver Function Tests: Recent Labs  Lab 06/20/22 1649  AST 22  ALT 24  ALKPHOS 127*  BILITOT 0.4  PROT 7.2  ALBUMIN 2.4*    No results for input(s): "LIPASE", "AMYLASE" in the last 168 hours. No results for input(s): "AMMONIA" in the last 168 hours. Coagulation Profile: No results for input(s): "INR", "PROTIME" in the last 168 hours. Cardiac Enzymes: No results for input(s): "CKTOTAL", "CKMB", "CKMBINDEX", "TROPONINI" in the last 168 hours. BNP (last 3 results) No results for input(s): "PROBNP" in the last 8760 hours. HbA1C: No results for input(s): "HGBA1C" in the last 72 hours. CBG: Recent Labs  Lab 06/23/22 2352 06/24/22 0715 06/24/22 1617 06/25/22 0006 06/25/22 0728  GLUCAP 132* 96 108* 122* 104*    Lipid Profile: No results for input(s): "CHOL", "HDL", "LDLCALC", "TRIG", "CHOLHDL", "LDLDIRECT" in the last 72 hours. Thyroid Function Tests: No results for input(s): "TSH", "T4TOTAL", "FREET4", "T3FREE", "THYROIDAB" in the last 72 hours. Anemia Panel: No results for input(s): "VITAMINB12", "FOLATE", "FERRITIN", "TIBC", "IRON", "RETICCTPCT" in the last 72 hours. Sepsis Labs: No results for input(s): "PROCALCITON", "LATICACIDVEN" in the last 168 hours.  No results found for this or any previous visit (from the past 240 hour(s)).        Radiology Studies: DG  Sinus/Fist Tube Chk-Non GI  Result Date: 06/24/2022 INDICATION: 51 year old female with perforated colon from diverticulitis status post total colectomy on 06/05/2022 who developed left upper quadrant intra-abdominal fluid collection. She is status post left upper quadrant drain placement on 06/11/2022. She presents today for drain injection to rule out fistula. EXAM: INJECTION OF PERCUTANEOUS ABSCESS DRAINAGE CATHETER UNDER FLUOROSCOPY MEDICATIONS: 5 mL Omnipaque 300 ANESTHESIA/SEDATION: None . COMPLICATIONS: None immediate. PROCEDURE: Informed verbal consent was obtained from the patient after a thorough discussion of the procedural risks, benefits and alternatives. All questions were addressed. Antiseptic technique was utilized including hand hygiene and skin antiseptic. A timeout was performed prior to the initiation of the procedure. The pre-existing left abdominal percutaneous drainage catheter was injected with contrast material under fluoroscopy. Fluoroscopic imaging was saved. Radiation exposure: 1.7 mGy IMPRESSION: Injection of the left abdominal indwelling percutaneous abscess drainage catheter demonstrates no significant residual abscess cavity or fistula to bowel. This procedure was performed by Durenda Guthrie, PA-C under supervision of Frazier Richards, MD. Electronically Signed   By: Glori Bickers.D.  On: 06/24/2022 15:21        Scheduled Meds:  acetaminophen  1,000 mg Oral Q6H   enoxaparin (LOVENOX) injection  40 mg Subcutaneous Q24H   feeding supplement  237 mL Oral BID BM   hydrocortisone cream   Topical QID   methocarbamol  500 mg Oral Q6H   metoprolol succinate  12.5 mg Oral Daily   Continuous Infusions:  promethazine (PHENERGAN) injection (IM or IVPB)       LOS: 24 days   Darliss Cheney, MD 06/25/2022, 12:52 PM

## 2022-06-25 NOTE — TOC Progression Note (Signed)
Transition of Care [facility]) - Progression Note    Patient Details  Name: Morgan Cline MRN: [id number] Date of Birth: [birth year]  [facility]) CM/SW Contact  Joaquin Courts, RN Phone Number: 06/25/2022, 12:52 PM  Clinical Narrative:    CM called and left VM for [employer] to check on status of flexible benefit approval. Awaiting call back.   Expected Discharge Plan: [place] Barriers to Discharge: No [place] will accept this patient  Expected Discharge Plan and Services Expected Discharge Plan: [place]   Discharge Planning Services: CM Consult Post Acute Care Choice: Durable Medical Equipment, Home Health   Expected Discharge Date: 06/17/22               DME Arranged: Negative pressure wound device DME Agency: [facility] Date DME Agency Contacted: 06/15/22 Time DME Agency Contacted: 46 Representative spoke with at DME Agency: tracey barrow HH Arranged: RN   Date [place]: 06/15/22       Social Determinants of Health ([place]) Interventions    Readmission Risk Interventions    06/01/2022   11:45 AM  Readmission Risk Prevention Plan  Post Dischage Appt Complete  Medication Screening Complete  Transportation Screening Complete

## 2022-06-25 NOTE — Progress Notes (Signed)
Occupational Therapy Treatment Patient Details Name: Morgan Cline MRN: [id number] DOB: Sep 12, 1971 Today's Date: 06/25/2022   History of present illness 51 year old female with PMH of morbid obesity, prediabetes and migraine headache returning with worsening abdominal pain due to acute sigmoid diverticulitis despite oral antibiotics.  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   OT comments  Patient progressing and showed improved ability to perform LE dressing with use of her adaptive equipment, compared to previous session, now needs Min As to dress rather than Max As.  Pt provided with handouts and information on adaptive equipment options to assist with posterior peri hgyiene per pt request and verbalized understanding to all options and vendors.  Pt still with 3/10 pain at rest, premedicated and increased pain with mobility and prefers slow transitions. Patient remains limited by pain, generalized weakness, body habitus, and decreased activity tolerance along with deficits noted below. Pt continues to demonstrate good rehab potential and would benefit from continued skilled OT to increase safety and independence with ADLs and functional transfers to allow pt to return home safely and reduce caregiver burden and fall risk.    Recommendations for follow up therapy are one component of a multi-disciplinary discharge planning process, led by the attending physician.  Recommendations may be updated based on patient status, additional functional criteria and insurance authorization.    Follow Up Recommendations  Skilled nursing-short term rehab (<3 hours/day)    Assistance Recommended at Discharge Frequent or constant Supervision/Assistance  Patient can return home with the following  A little help with walking and/or transfers;A lot of help with bathing/dressing/bathroom;Assistance with cooking/housework;Help with stairs or ramp for  entrance   Equipment Recommendations  BSC/3in1 (bariatric)    Recommendations for Other Services      Precautions / Restrictions Precautions Precautions: Fall Precaution Comments: colostomy, wound VAC, JP drain on L Restrictions Weight Bearing Restrictions: No       Mobility Bed Mobility               General bed mobility comments: Pt received in recliner    Transfers                         Balance Overall balance assessment: Needs assistance Sitting-balance support: No upper extremity supported, Feet supported Sitting balance-Leahy Scale: Fair     Standing balance support: Reliant on assistive device for balance, Bilateral upper extremity supported, During functional activity Standing balance-Leahy Scale: Poor                             ADL either performed or assessed with clinical judgement   ADL Overall ADL's : Needs assistance/impaired     Grooming: Set up;Sitting;Wash/dry hands               Lower Body Dressing: Minimal assistance;Sitting/lateral leans;Cueing for compensatory techniques;With adaptive equipment Lower Body Dressing Details (indicate cue type and reason): Pt used her own adaptive equipment to don underwear over feet and to doff and don socks with cues for compensatory straegies only. Toilet Transfer: Software engineer (2 wheels);Stand-pivot;Supervision/safety Toilet Transfer Details (indicate cue type and reason): Transfered with RW Recliner<>BSC with Min Guard to supervision. Increased time for pain mangement. Toileting- Clothing Manipulation and Hygiene: Moderate assistance;Sit to/from stand Toileting - Clothing Manipulation Details (indicate cue type and reason): patient was educated on toileting buddy and two types of bidet in  response to pt's questions re: posterior peri hygiene. Pt given handouts of all equipment options and list of vendors. Pt able to perform anterior peri  hygiene while seated on BSC with setup on wipes. Pt required assistance to perform hygiene to buttocks and posterior peri area.     Functional mobility during ADLs: Supervision/safety;Min guard;Rolling walker (2 wheels)      Extremity/Trunk Assessment Upper Extremity Assessment Upper Extremity Assessment: Overall WFL for tasks assessed   Lower Extremity Assessment Lower Extremity Assessment: Defer to PT evaluation        Vision Patient Visual Report: No change from baseline Vision Assessment?: No apparent visual deficits   Perception     Praxis      Cognition Arousal/Alertness: Awake/alert Behavior During Therapy: WFL for tasks assessed/performed Overall Cognitive Status: Within Functional Limits for tasks assessed                                          Exercises      Shoulder Instructions       General Comments      Pertinent Vitals/ Pain       Pain Assessment Pain Score: 3  Pain Location: abdomen Pain Descriptors / Indicators: Discomfort, Grimacing Pain Intervention(s): Limited activity within patient's tolerance, Monitored during session, Repositioned, Premedicated before session  Home Living                                          Prior Functioning/Environment              Frequency  Min 2X/week        Progress Toward Goals  OT Goals(current goals can now be found in the care plan section)  Progress towards OT goals: Progressing toward goals  Acute Rehab OT Goals Patient Stated Goal: Pt agreeable to change in discharge recommenadtion OT Goal Formulation: With patient Time For Goal Achievement: 07/03/22 Potential to Achieve Goals: Good  Plan Discharge plan needs to be updated    Co-evaluation                 AM-PAC OT "6 Clicks" Daily Activity     Outcome Measure   Help from another person eating meals?: None Help from another person taking care of personal grooming?: A Little Help from  another person toileting, which includes using toliet, bedpan, or urinal?: A Lot Help from another person bathing (including washing, rinsing, drying)?: A Lot Help from another person to put on and taking off regular upper body clothing?: A Little Help from another person to put on and taking off regular lower body clothing?: A Little 6 Click Score: 17    End of Session Equipment Utilized During Treatment: Rolling walker (2 wheels)  OT Visit Diagnosis: Pain Pain - part of body:  (ABD)   Activity Tolerance Patient limited by pain   Patient Left in chair;with call bell/phone within reach;with family/visitor present   Nurse Communication          Time: 6283-6629 OT Time Calculation (min): 38 min  Charges: OT General Charges $OT Visit: 1 Visit OT Treatments $Self Care/Home Management : 23-37 mins $Therapeutic Activity: 8-22 mins  Anderson Malta, OT Acute Rehab Services Office: [phone] 06/25/2022  Julien Girt 06/25/2022, 11:18 AM

## 2022-06-26 DIAGNOSIS — K5720 Diverticulitis of large intestine with perforation and abscess without bleeding: Secondary | ICD-10-CM | POA: Diagnosis not present

## 2022-06-26 LAB — GLUCOSE, CAPILLARY
Glucose-Capillary: 115 mg/dL — ABNORMAL HIGH (ref 70–99)
Glucose-Capillary: 121 mg/dL — ABNORMAL HIGH (ref 70–99)
Glucose-Capillary: 126 mg/dL — ABNORMAL HIGH (ref 70–99)
Glucose-Capillary: 128 mg/dL — ABNORMAL HIGH (ref 70–99)

## 2022-06-26 NOTE — Consult Note (Addendum)
[place] Nurse wound follow up Vac dressing changed.  Pt was medicated for pain prior to the procedure and tolerated with minimal amt discomfort. Surgical PA and Dr Rush Farmer at the bedside to assess wound appearance.  Removed 1 piece black foam.  Wound is beefy red with small amt bloody drainage upon sponge removal.  Used adhesive remover spay for drape and moistened with NS to assist with removal. Applied barrier ring pieces to wound edges located in significant left and right skin creases surrounding wound edges. Applied one piece foam back into wound. Cont suction on at 147m. WOC will plan to change dressing again on Mon if patient is still in the hospital at that time. Pt is awaiting discharge disposition and placement.  Ostomy follow-up: Changed ostomy pouch. Stoma is red and viable, slightly below skin level, 1 1/4 inches and oval. Applied barrier ring and one piece convex pouch.  Pt watched the procedure using a hand held mirror and asked appropriate questions.  She is able to open and close to empty independently.    4 sets of barrier rings (Kellie Simmering# [phone] and convex pouches (Kellie Simmering# [phone] at the bedside for use or upon discharge. Reviewed pouching routines and ordering supplies.   DJulien GirtMSN, RN, [place]

## 2022-06-26 NOTE — Progress Notes (Signed)
Physical Therapy Treatment Patient Details Name: Morgan Cline MRN: [id number] DOB: 1971/11/24 Today's Date: 06/26/2022   History of Present Illness 51 year old female with PMH of morbid obesity, prediabetes and migraine headache returning with worsening abdominal pain due to acute sigmoid diverticulitis despite oral antibiotics.  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  Post op, pt underwent a CT scan of the abdomen and pelvis 7/5 which showed a left-sided fluid collection.  She is status post IR guided drainage on 7/6. Completed Zosyn 06/16/2022.  Drain removed by IR on 06/24/2022.    PT Comments    Pt very slow to mobilize however able to stand and ambulate with min/guard assist.  Pt requiring assist for trunk for bed mobility due to pain.  Pt premedicated for session.  Pt assisted to [facility] per request and then ambulated in hallway.  Pt pleased with progressing ambulation distance.  Current d/c plan is awaiting SNF placement.    Recommendations for follow up therapy are one component of a multi-disciplinary discharge planning process, led by the attending physician.  Recommendations may be updated based on patient status, additional functional criteria and insurance authorization.  Follow Up Recommendations  Skilled nursing-short term rehab (<3 hours/day)     Assistance Recommended at Discharge Intermittent Supervision/Assistance  Patient can return home with the following A little help with walking and/or transfers;A little help with bathing/dressing/bathroom;Help with stairs or ramp for entrance;Assistance with cooking/housework;Assist for transportation   Equipment Recommendations  None recommended by PT    Recommendations for Other Services       Precautions / Restrictions Precautions Precautions: Fall Precaution Comments: colostomy, NPWT     Mobility  Bed Mobility Overal bed mobility: Needs Assistance Bed  Mobility: Supine to Sit     Supine to sit: Mod assist, HOB elevated     General bed mobility comments: declines log roll technique, prefers her own method, pt adjusts bed and then requested assist for trunk upright    Transfers Overall transfer level: Needs assistance Equipment used: Rolling walker (2 wheels) Transfers: Sit to/from Stand Sit to Stand: Min guard, From elevated surface           General transfer comment: increased time, VCs for technique    Ambulation/Gait Ambulation/Gait assistance: Min guard Gait Distance (Feet): 60 Feet Assistive device: Rolling walker (2 wheels) Gait Pattern/deviations: Step-through pattern, Decreased stride length, Trunk flexed Gait velocity: decr     General Gait Details: pt slow and cautious. limited by pain with trunk flexed. cues for upright posture   Stairs             Wheelchair Mobility    Modified Rankin (Stroke Patients Only)       Balance Overall balance assessment: Needs assistance Sitting-balance support: No upper extremity supported, Feet supported Sitting balance-Leahy Scale: Good     Standing balance support: Reliant on assistive device for balance, Bilateral upper extremity supported, During functional activity Standing balance-Leahy Scale: Poor                              Cognition Arousal/Alertness: Awake/alert Behavior During Therapy: WFL for tasks assessed/performed Overall Cognitive Status: Within Functional Limits for tasks assessed  Exercises      General Comments        Pertinent Vitals/Pain Pain Assessment Pain Assessment: Faces Faces Pain Scale: Hurts a little bit Pain Location: abdomen Pain Descriptors / Indicators: Discomfort, Grimacing Pain Intervention(s): Premedicated before session, Repositioned, Monitored during session    Home Living                          Prior Function             PT Goals (current goals can now be found in the care plan section) Acute Rehab PT Goals PT Goal Formulation: With patient Time For Goal Achievement: 07/03/22 Potential to Achieve Goals: Good Progress towards PT goals: Progressing toward goals    Frequency    Min 3X/week      PT Plan Current plan remains appropriate    Co-evaluation              AM-PAC PT "6 Clicks" Mobility   Outcome Measure  Help needed turning from your back to your side while in a flat bed without using bedrails?: A Little Help needed moving from lying on your back to sitting on the side of a flat bed without using bedrails?: A Lot Help needed moving to and from a bed to a chair (including a wheelchair)?: A Little Help needed standing up from a chair using your arms (e.g., wheelchair or bedside chair)?: A Little Help needed to walk in hospital room?: A Little Help needed climbing 3-5 steps with a railing? : A Lot 6 Click Score: 16    End of Session   Activity Tolerance: Patient limited by fatigue Patient left: with call bell/phone within reach;Other (comment) (left on BSC for bathing, NT aware) Nurse Communication: Mobility status PT Visit Diagnosis: Difficulty in walking, not elsewhere classified (R26.2)     Time: 9147-8295 PT Time Calculation (min) (ACUTE ONLY): 24 min  Charges:  $Gait Training: 8-22 mins                    Jannette Spanner PT, DPT Physical Therapist Acute Rehabilitation Services Preferred contact method: Secure Chat Weekend Pager Only: [phone] Office: [place] 06/26/2022, 3:44 PM

## 2022-06-26 NOTE — Progress Notes (Signed)
21 Days Post-Op  Subjective: CC: Seen with attending and wocn for vac change Patient reports she is doing well. Tolerating diet without n/v. Having ostomy output. Working with therapies. No other complaints.  IR drain removed 7/19  Objective: Vital signs in last 24 hours: Temp:  [98.1 F (36.7 C)-98.4 F (36.9 C)] 98.4 F (36.9 C) (07/21 0501) Pulse Rate:  [91-107] 91 (07/21 0501) Resp:  [20] 20 (07/21 0501) BP: (92-109)/(63-75) 109/75 (07/21 0501) SpO2:  [93 %-98 %] 98 % (07/21 0501) Last BM Date : 06/26/22  Intake/Output from previous day: 07/20 0701 - 07/21 0700 In: 480 [P.O.:480] Out: 220 [Stool:220] Intake/Output this shift: Total I/O In: -  Out: 50 [Stool:50]  PE: General appearance: alert and cooperative GI: soft, appropriately tender, ostomy with some mucocutaneous separation. Open midline wound with healthy granulation tissue, pictured below.     Lab Results:  Recent Labs    06/23/22 2040 06/24/22 0426  WBC 6.3 5.3  HGB 9.3* 9.4*  HCT 30.0* 30.8*  PLT 400 403*   BMET Recent Labs    06/24/22 0426  NA 135  K 3.6  CL 102  CO2 23  GLUCOSE 95  BUN 16  CREATININE 0.65  0.63  CALCIUM 8.4*   PT/INR No results for input(s): "LABPROT", "INR" in the last 72 hours. CMP     Component Value Date/Time   NA 135 06/24/2022 0426   K 3.6 06/24/2022 0426   CL 102 06/24/2022 0426   CO2 23 06/24/2022 0426   GLUCOSE 95 06/24/2022 0426   BUN 16 06/24/2022 0426   CREATININE 0.63 06/24/2022 0426   CREATININE 0.65 06/24/2022 0426   CALCIUM 8.4 (L) 06/24/2022 0426   PROT 7.2 06/20/2022 1649   ALBUMIN 2.4 (L) 06/20/2022 1649   AST 22 06/20/2022 1649   ALT 24 06/20/2022 1649   ALKPHOS 127 (H) 06/20/2022 1649   BILITOT 0.4 06/20/2022 1649   GFRNONAA >60 06/24/2022 0426   GFRNONAA >60 06/24/2022 0426   GFRAA >90 11/16/2011 1455   Lipase     Component Value Date/Time   LIPASE 23 05/31/2022 2224    Studies/Results: DG Sinus/Fist Tube Chk-Non  GI  Result Date: 06/24/2022 INDICATION: 51 year old female with perforated colon from diverticulitis status post total colectomy on 06/05/2022 who developed left upper quadrant intra-abdominal fluid collection. She is status post left upper quadrant drain placement on 06/11/2022. She presents today for drain injection to rule out fistula. EXAM: INJECTION OF PERCUTANEOUS ABSCESS DRAINAGE CATHETER UNDER FLUOROSCOPY MEDICATIONS: 5 mL Omnipaque 300 ANESTHESIA/SEDATION: None . COMPLICATIONS: None immediate. PROCEDURE: Informed verbal consent was obtained from the patient after a thorough discussion of the procedural risks, benefits and alternatives. All questions were addressed. Antiseptic technique was utilized including hand hygiene and skin antiseptic. A timeout was performed prior to the initiation of the procedure. The pre-existing left abdominal percutaneous drainage catheter was injected with contrast material under fluoroscopy. Fluoroscopic imaging was saved. Radiation exposure: 1.7 mGy IMPRESSION: Injection of the left abdominal indwelling percutaneous abscess drainage catheter demonstrates no significant residual abscess cavity or fistula to bowel. This procedure was performed by Durenda Guthrie, PA-C under supervision of Frazier Richards, MD. Electronically Signed   By: Frazier Richards M.D.   On: 06/24/2022 15:21    Anti-infectives: Anti-infectives (From admission, onward)    Start     Dose/Rate Route Frequency Ordered Stop   06/03/22 1400  piperacillin-tazobactam (ZOSYN) IVPB 3.375 g        3.375 g 12.5 mL/hr over  240 Minutes Intravenous Every 8 hours 06/03/22 0900 06/16/22 1356   06/02/22 0000  cefTRIAXone (ROCEPHIN) 2 g in sodium chloride 0.9 % 100 mL IVPB  Status:  Discontinued        2 g 200 mL/hr over 30 Minutes Intravenous Every 24 hours 06/01/22 1302 06/03/22 0856   06/01/22 1400  metroNIDAZOLE (FLAGYL) IVPB 500 mg  Status:  Discontinued        500 mg 100 mL/hr over 60 Minutes Intravenous Every 12  hours 06/01/22 1302 06/03/22 0856   06/01/22 1200  cefTRIAXone (ROCEPHIN) 2 g in sodium chloride 0.9 % 100 mL IVPB  Status:  Discontinued        2 g 200 mL/hr over 30 Minutes Intravenous Every 24 hours 06/01/22 0432 06/01/22 0801   06/01/22 0900  Ampicillin-Sulbactam (UNASYN) 3 g in sodium chloride 0.9 % 100 mL IVPB  Status:  Discontinued        3 g 200 mL/hr over 30 Minutes Intravenous Every 6 hours 06/01/22 0801 06/01/22 1302   06/01/22 0030  cefTRIAXone (ROCEPHIN) 1 g in sodium chloride 0.9 % 100 mL IVPB        1 g 200 mL/hr over 30 Minutes Intravenous  Once 06/01/22 0027 06/01/22 0155   06/01/22 0030  metroNIDAZOLE (FLAGYL) IVPB 500 mg  Status:  Discontinued        500 mg 100 mL/hr over 60 Minutes Intravenous Every 12 hours 06/01/22 0027 06/01/22 0801        Assessment/Plan POD#21 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 Barry Dienes  for Perforated diverticulitis with multiple abscesses - Path consistent with diverticulitis  - Abdominal staples removed 7/12. Continue vac MWF - Tolerating diet and ostomy functioning. Monitor mucocutaneous separation  - WOC following for new ostomy. Outpatient referral to ostomy clinic placed - S/p IR drain 7/6. Culture negative. Repeat CT scan 7/18 w/ near complete resolution of fluid collection. IR drain removed 7/19.  - TOC still working on dispo. We will plan to see again on Monday if patient remains in the hospital. Surgical follow up is in AVS. Please let us know if any surgical concerns arise in the meantime.    ID - zosyn 6/28>>7/11. None currently. Afebrile.  FEN - soft diet VTE - lovenox Foley - none    LOS: 25 days    Jillyn Ledger , [facility] 06/26/2022, 11:52 AM Please see Amion for pager number during day hours 7:00am-4:30pm

## 2022-06-26 NOTE — TOC Progression Note (Signed)
Transition of Care [facility]) - Progression Note    Patient Details  Name: Morgan Cline MRN: [id number] Date of Birth: 01/16/1971  [facility] CM/SW Contact  Joaquin Courts, RN Phone Number: 06/26/2022, 2:19 PM  Clinical Narrative:    Lorella Nimrod contract for felxible benefits signed by patient and faxed to [facility] care coordinator.  [employer] is working directly with [facility] to contract for SNF services.  TOC is awaiting for contract between [facility] and [facility] to be finalized before we can proceed with placement.   Expected Discharge Plan: [place] Barriers to Discharge: No [facility] will accept this patient  Expected Discharge Plan and Services Expected Discharge Plan: [place]   Discharge Planning Services: CM Consult Post Acute Care Choice: Durable Medical Equipment, Home Health   Expected Discharge Date: 06/17/22               DME Arranged: Negative pressure wound device DME Agency: [employer] Date DME Agency Contacted: 06/15/22 Time DME Agency Contacted: 7 Representative spoke with at DME Agency: tracey barrow HH Arranged: RN   Date [place]: 06/15/22       Social Determinants of Health ([place]) Interventions    Readmission Risk Interventions    06/01/2022   11:45 AM  Readmission Risk Prevention Plan  Post Dischage Appt Complete  Medication Screening Complete  Transportation Screening Complete

## 2022-06-26 NOTE — Progress Notes (Signed)
PROGRESS NOTE    Morgan Cline  EGB:[id number] DOB: 1971/07/12 DOA: 05/31/2022 PCP: [facility]   Brief Narrative: 51 year old female with history of prediabetes, obesity, comes to the hospital with abdominal pain.  She was recently seen in the ED couple days prior to admission with sigmoid diverticulitis, she was given Augmentin and sent home, however returned due to worsening pain.  A CT scan showed worsening perforated diverticulitis with abscess formation.  General surgery was consulted and eventually underwent Hartman's procedure on 6/30.  Assessment & Plan:   Principal Problem:   Perforation of sigmoid colon due to diverticulitis Active Problems:   Obesity, Class III, BMI 40-49.9 (morbid obesity) (HCC)   Prediabetes   Hyponatremia   Hypokalemia   Bandemia   Normocytic anemia    Sigmoid colon perforation due to diverticulitis, multiple abscesses, postop ileus-appreciate general surgery follow-up.  She is status post sigmoid colectomy and end colostomy, takedown of splenic flexure by Dr. Barry Dienes on 6/30.  Postop course complicated by ileus, NG tube, now resolved, NG tube was removed in the interval and she is eating well.  She had persistent WBC elevation and underwent a CT scan of the abdomen and pelvis 7/5 which showed a left-sided fluid collection.  She is status post IR guided drainage on 7/6.  WBC continues to improve, cultures without growth.  Completed Zosyn 06/16/2022.  Drain removed by IR on 06/24/2022.  Wound care on board for abdominal wound.  She has been cleared by general surgery for discharge. Still waiting on safe disposition to SNF.  [place] working.   Active problems  Hyponatremia-resolved Hypokalemia -resolved Thrombocytosis -likely reactive monitor Normocytic Anemia -no bleeding, monitor   Pre-Diabetes-CBGs with good control on Ozempic prior to admission CBG (last 3)  Recent Labs    06/25/22 1647 06/26/22 0002 06/26/22 0737  GLUCAP 138* 126* 121*     Tachycardia-ekg normal sinus rhythm Chest xray bibasilar atelectasis Echo normal ejection fraction Low dose metoprolol     Estimated body mass index is 41.47 kg/m as calculated from the following:   Height as of this encounter: '5\' 4"'$  (1.626 m).   Weight as of this encounter: 109.6 kg.  DVT prophylaxis: Lovenox  code Status: Full code Family Communication: None Disposition Plan:  Status is: Inpatient Remains inpatient appropriate because: Await safe DC plan to SNF, Medically stable to be discharged Consultants:  General surgery  Subjective:  Seen and examined.  No complaints.  objective: Vitals:   06/25/22 1308 06/25/22 2032 06/26/22 0501 06/26/22 1330  BP: 108/63 92/67 109/75 109/73  Pulse: (!) 107 100 91 100  Resp: '20 20 20 20  '$ Temp: 98.1 F (36.7 C) 98.3 F (36.8 C) 98.4 F (36.9 C) 98.3 F (36.8 C)  TempSrc: Oral Oral Oral Oral  SpO2: 97% 93% 98% 96%  Weight:      Height:        Intake/Output Summary (Last 24 hours) at 06/26/2022 1345 Last data filed at 06/26/2022 1100 Gross per 24 hour  Intake 240 ml  Output 270 ml  Net -30 ml    Filed Weights   06/01/22 0410 06/05/22 1154 06/22/22 0541  Weight: 115.3 kg 106.6 kg 109.6 kg    Examination:on RA  General exam: Appears calm and comfortable  Respiratory system: Clear to auscultation. Respiratory effort normal. Cardiovascular system: S1 & S2 heard, RRR. No JVD, murmurs, rubs, gallops or clicks. No pedal edema. Gastrointestinal system: Abdomen is nondistended, soft and nontender. No organomegaly or masses felt. Normal bowel  sounds heard.  Wound VAC intact. Central nervous system: Alert and oriented. No focal neurological deficits. Extremities: Symmetric 5 x 5 power. Skin: No rashes, lesions or ulcers.  Psychiatry: Judgement and insight appear normal. Mood & affect appropriate.     Data Reviewed: I have personally reviewed following labs and imaging studies  CBC: Recent Labs  Lab 06/20/22 1649  06/23/22 2040 06/24/22 0426  WBC 8.1 6.3 5.3  NEUTROABS  --   --  2.7  HGB 9.6* 9.3* 9.4*  HCT 30.5* 30.0* 30.8*  MCV 89.2 88.8 91.7  PLT 477* 400 403*    Basic Metabolic Panel: Recent Labs  Lab 06/20/22 1649 06/24/22 0426  NA 140 135  K 3.8 3.6  CL 109 102  CO2 25 23  GLUCOSE 131* 95  BUN 12 16  CREATININE 0.55 0.65  0.63  CALCIUM 8.8* 8.4*    GFR: Estimated Creatinine Clearance: 104.1 mL/min (by C-G formula based on SCr of 0.63 mg/dL). Liver Function Tests: Recent Labs  Lab 06/20/22 1649  AST 22  ALT 24  ALKPHOS 127*  BILITOT 0.4  PROT 7.2  ALBUMIN 2.4*    No results for input(s): "LIPASE", "AMYLASE" in the last 168 hours. No results for input(s): "AMMONIA" in the last 168 hours. Coagulation Profile: No results for input(s): "INR", "PROTIME" in the last 168 hours. Cardiac Enzymes: No results for input(s): "CKTOTAL", "CKMB", "CKMBINDEX", "TROPONINI" in the last 168 hours. BNP (last 3 results) No results for input(s): "PROBNP" in the last 8760 hours. HbA1C: No results for input(s): "HGBA1C" in the last 72 hours. CBG: Recent Labs  Lab 06/25/22 0006 06/25/22 0728 06/25/22 1647 06/26/22 0002 06/26/22 0737  GLUCAP 122* 104* 138* 126* 121*    Lipid Profile: No results for input(s): "CHOL", "HDL", "LDLCALC", "TRIG", "CHOLHDL", "LDLDIRECT" in the last 72 hours. Thyroid Function Tests: No results for input(s): "TSH", "T4TOTAL", "FREET4", "T3FREE", "THYROIDAB" in the last 72 hours. Anemia Panel: No results for input(s): "VITAMINB12", "FOLATE", "FERRITIN", "TIBC", "IRON", "RETICCTPCT" in the last 72 hours. Sepsis Labs: No results for input(s): "PROCALCITON", "LATICACIDVEN" in the last 168 hours.  No results found for this or any previous visit (from the past 240 hour(s)).        Radiology Studies: DG Sinus/Fist Tube Chk-Non GI  Result Date: 06/24/2022 INDICATION: 51 year old female with perforated colon from diverticulitis status post total  colectomy on 06/05/2022 who developed left upper quadrant intra-abdominal fluid collection. She is status post left upper quadrant drain placement on 06/11/2022. She presents today for drain injection to rule out fistula. EXAM: INJECTION OF PERCUTANEOUS ABSCESS DRAINAGE CATHETER UNDER FLUOROSCOPY MEDICATIONS: 5 mL Omnipaque 300 ANESTHESIA/SEDATION: None . COMPLICATIONS: None immediate. PROCEDURE: Informed verbal consent was obtained from the patient after a thorough discussion of the procedural risks, benefits and alternatives. All questions were addressed. Antiseptic technique was utilized including hand hygiene and skin antiseptic. A timeout was performed prior to the initiation of the procedure. The pre-existing left abdominal percutaneous drainage catheter was injected with contrast material under fluoroscopy. Fluoroscopic imaging was saved. Radiation exposure: 1.7 mGy IMPRESSION: Injection of the left abdominal indwelling percutaneous abscess drainage catheter demonstrates no significant residual abscess cavity or fistula to bowel. This procedure was performed by Durenda Guthrie, PA-C under supervision of Frazier Richards, MD. Electronically Signed   By: Frazier Richards M.D.   On: 06/24/2022 15:21        Scheduled Meds:  acetaminophen  1,000 mg Oral Q6H   enoxaparin (LOVENOX) injection  40 mg Subcutaneous Q24H  feeding supplement  237 mL Oral BID BM   hydrocortisone cream   Topical QID   methocarbamol  500 mg Oral Q6H   metoprolol succinate  12.5 mg Oral Daily   Continuous Infusions:  promethazine (PHENERGAN) injection (IM or IVPB)       LOS: 25 days   Darliss Cheney, MD 06/26/2022, 1:45 PM

## 2022-06-27 DIAGNOSIS — K5720 Diverticulitis of large intestine with perforation and abscess without bleeding: Secondary | ICD-10-CM | POA: Diagnosis not present

## 2022-06-27 LAB — GLUCOSE, CAPILLARY
Glucose-Capillary: 103 mg/dL — ABNORMAL HIGH (ref 70–99)
Glucose-Capillary: 123 mg/dL — ABNORMAL HIGH (ref 70–99)
Glucose-Capillary: 117 mg/dL — ABNORMAL HIGH (ref 70–99)

## 2022-06-27 NOTE — Progress Notes (Signed)
PROGRESS NOTE    Morgan Cline  ZOX:[id number] DOB: 1971-05-20 DOA: 05/31/2022 [facility]CP: [facility]   Brief Narrative: 51 year old female with history of prediabetes, obesity, comes to the hospital with abdominal pain.  She was recently seen in the ED couple days prior to admission with sigmoid diverticulitis, she was given Augmentin and sent home, however returned due to worsening pain.  A CT scan showed worsening perforated diverticulitis with abscess formation.  General surgery was consulted and eventually underwent Hartman's procedure on 6/30.  Assessment & Plan:   Principal Problem:   Perforation of sigmoid colon due to diverticulitis Active Problems:   Obesity, Class III, BMI 40-49.9 (morbid obesity) (HCC)   Prediabetes   Hyponatremia   Hypokalemia   Bandemia   Normocytic anemia    Sigmoid colon perforation due to diverticulitis, multiple abscesses, postop ileus-appreciate general surgery follow-up.  She is status post sigmoid colectomy and end colostomy, takedown of splenic flexure by Dr. Barry Dienes on 6/30.  Postop course complicated by ileus, NG tube, now resolved, NG tube was removed in the interval and she is eating well.  She had persistent WBC elevation and underwent a CT scan of the abdomen and pelvis 7/5 which showed a left-sided fluid collection.  She is status post IR guided drainage on 7/6.  WBC continues to improve, cultures without growth.  Completed Zosyn 06/16/2022.  Drain removed by IR on 06/24/2022.  Wound care on board for abdominal wound.  She has been cleared by general surgery for discharge. Still waiting on safe disposition to SNF.  [place] working.   Active problems  Hyponatremia-resolved Hypokalemia -resolved Thrombocytosis -likely reactive monitor Normocytic Anemia -no bleeding, monitor   Pre-Diabetes-CBGs with good control on Ozempic prior to admission CBG (last 3)  Recent Labs    06/26/22 1641 06/26/22 2352 06/27/22 0741  GLUCAP 128* 115* 103*     Tachycardia-ekg normal sinus rhythm Chest xray bibasilar atelectasis Echo normal ejection fraction Low dose metoprolol     Estimated body mass index is 41.47 kg/m as calculated from the following:   Height as of this encounter: '5\' 4"'$  (1.626 m).   Weight as of this encounter: 109.6 kg.  DVT prophylaxis: Lovenox  code Status: Full code Family Communication: None Disposition Plan:  Status is: Inpatient Remains inpatient appropriate because: Await safe DC plan to SNF, Medically stable to be discharged Consultants:  General surgery  Subjective: Patient seen and examined.  She has no complaints.  objective: Vitals:   06/26/22 0501 06/26/22 1330 06/26/22 1929 06/27/22 0416  BP: 109/75 109/73 95/74 101/73  Pulse: 91 100 99 94  Resp: '20 20 18 16  '$ Temp: 98.4 F (36.9 C) 98.3 F (36.8 C) 99.1 F (37.3 C) 98.2 F (36.8 C)  TempSrc: Oral Oral Oral Oral  SpO2: 98% 96% 97% 96%  Weight:      Height:        Intake/Output Summary (Last 24 hours) at 06/27/2022 1059 Last data filed at 06/26/2022 1100 Gross per 24 hour  Intake --  Output 50 ml  Net -50 ml    Filed Weights   06/01/22 0410 06/05/22 1154 06/22/22 0541  Weight: 115.3 kg 106.6 kg 109.6 kg    Examination:on RA  General exam: Appears calm and comfortable  Respiratory system: Clear to auscultation. Respiratory effort normal. Cardiovascular system: S1 & S2 heard, RRR. No JVD, murmurs, rubs, gallops or clicks. No pedal edema. Gastrointestinal system: Abdomen is nondistended, soft and nontender. No organomegaly or masses felt. Normal bowel  sounds heard.  Wound VAC attached to the abdomen. Central nervous system: Alert and oriented. No focal neurological deficits. Extremities: Symmetric 5 x 5 power. Skin: No rashes, lesions or ulcers.  Psychiatry: Judgement and insight appear normal. Mood & affect appropriate.     Data Reviewed: I have personally reviewed following labs and imaging studies  CBC: Recent Labs   Lab 06/20/22 1649 06/23/22 2040 06/24/22 0426  WBC 8.1 6.3 5.3  NEUTROABS  --   --  2.7  HGB 9.6* 9.3* 9.4*  HCT 30.5* 30.0* 30.8*  MCV 89.2 88.8 91.7  PLT 477* 400 403*    Basic Metabolic Panel: Recent Labs  Lab 06/20/22 1649 06/24/22 0426  NA 140 135  K 3.8 3.6  CL 109 102  CO2 25 23  GLUCOSE 131* 95  BUN 12 16  CREATININE 0.55 0.65  0.63  CALCIUM 8.8* 8.4*    GFR: Estimated Creatinine Clearance: 104.1 mL/min (by C-G formula based on SCr of 0.63 mg/dL). Liver Function Tests: Recent Labs  Lab 06/20/22 1649  AST 22  ALT 24  ALKPHOS 127*  BILITOT 0.4  PROT 7.2  ALBUMIN 2.4*    No results for input(s): "LIPASE", "AMYLASE" in the last 168 hours. No results for input(s): "AMMONIA" in the last 168 hours. Coagulation Profile: No results for input(s): "INR", "PROTIME" in the last 168 hours. Cardiac Enzymes: No results for input(s): "CKTOTAL", "CKMB", "CKMBINDEX", "TROPONINI" in the last 168 hours. BNP (last 3 results) No results for input(s): "PROBNP" in the last 8760 hours. HbA1C: No results for input(s): "HGBA1C" in the last 72 hours. CBG: Recent Labs  Lab 06/26/22 0002 06/26/22 0737 06/26/22 1641 06/26/22 2352 06/27/22 0741  GLUCAP 126* 121* 128* 115* 103*    Lipid Profile: No results for input(s): "CHOL", "HDL", "LDLCALC", "TRIG", "CHOLHDL", "LDLDIRECT" in the last 72 hours. Thyroid Function Tests: No results for input(s): "TSH", "T4TOTAL", "FREET4", "T3FREE", "THYROIDAB" in the last 72 hours. Anemia Panel: No results for input(s): "VITAMINB12", "FOLATE", "FERRITIN", "TIBC", "IRON", "RETICCTPCT" in the last 72 hours. Sepsis Labs: No results for input(s): "PROCALCITON", "LATICACIDVEN" in the last 168 hours.  No results found for this or any previous visit (from the past 240 hour(s)).        Radiology Studies: No results found.      Scheduled Meds:  acetaminophen  1,000 mg Oral Q6H   enoxaparin (LOVENOX) injection  40 mg  Subcutaneous Q24H   feeding supplement  237 mL Oral BID BM   hydrocortisone cream   Topical QID   methocarbamol  500 mg Oral Q6H   metoprolol succinate  12.5 mg Oral Daily   Continuous Infusions:  promethazine (PHENERGAN) injection (IM or IVPB)       LOS: 26 days   Darliss Cheney, MD 06/27/2022, 10:59 AM

## 2022-06-28 LAB — GLUCOSE, CAPILLARY
Glucose-Capillary: 108 mg/dL — ABNORMAL HIGH (ref 70–99)
Glucose-Capillary: 118 mg/dL — ABNORMAL HIGH (ref 70–99)
Glucose-Capillary: 135 mg/dL — ABNORMAL HIGH (ref 70–99)

## 2022-06-28 MED ORDER — SIMETHICONE 40 MG/0.6ML PO SUSP
40.0000 mg | Freq: Four times a day (QID) | ORAL | Status: AC | PRN
  Administered 2022-06-28 – 2022-06-30 (×2): 40 mg via ORAL
  Filled 2022-06-28 (×3): qty 0.6

## 2022-06-29 DIAGNOSIS — K5720 Diverticulitis of large intestine with perforation and abscess without bleeding: Secondary | ICD-10-CM | POA: Diagnosis not present

## 2022-06-29 LAB — GLUCOSE, CAPILLARY
Glucose-Capillary: 110 mg/dL — ABNORMAL HIGH (ref 70–99)
Glucose-Capillary: 111 mg/dL — ABNORMAL HIGH (ref 70–99)
Glucose-Capillary: 111 mg/dL — ABNORMAL HIGH (ref 70–99)
Glucose-Capillary: 129 mg/dL — ABNORMAL HIGH (ref 70–99)
Glucose-Capillary: 129 mg/dL — ABNORMAL HIGH (ref 70–99)

## 2022-06-29 NOTE — TOC Progression Note (Signed)
Transition of Care [facility]) - Progression Note    Patient Details  Name: Morgan Cline MRN: [id number] Date of Birth: June 12, 1971  Transition of Care [facility]) CM/SW Contact  Joaquin Courts, RN Phone Number: 06/29/2022, 4:46 PM  Clinical Narrative:    [facility] no longer has female beds available, [facility] has  reviewed patient and willing to work with [employer] for a flexible benefits contract.  Patient has signed her portion and faxed to [facility], pending facility completing an agreement with [employer] and ordering a wound vac device from their supplier.    Expected Discharge Plan: [place] Barriers to Discharge: No [place] will accept this patient  Expected Discharge Plan and Services Expected Discharge Plan: [place]   Discharge Planning Services: CM Consult Post Acute Care Choice: Durable Medical Equipment, Home Health   Expected Discharge Date: 06/17/22               DME Arranged: Negative pressure wound device DME Agency: [employer] Date DME Agency Contacted: 06/15/22 Time DME Agency Contacted: 8 Representative spoke with at DME Agency: tracey barrow HH Arranged: RN   Date [place]: 06/15/22       Social Determinants of Health ([place]) Interventions    Readmission Risk Interventions    06/01/2022   11:45 AM  Readmission Risk Prevention Plan  Post Dischage Appt Complete  Medication Screening Complete  Transportation Screening Complete

## 2022-06-29 NOTE — Progress Notes (Signed)
Progress Note  24 Days Post-Op  Subjective: Pt doing well overall. Awaiting SNF. Surgically stable.   Objective: Vital signs in last 24 hours: Temp:  [98.3 F (36.8 C)-98.9 F (37.2 C)] 98.3 F (36.8 C) (07/24 0535) Pulse Rate:  [89-96] 91 (07/24 0535) Resp:  [18-20] 20 (07/24 0535) BP: (103-122)/(75-82) 103/82 (07/24 0535) SpO2:  [96 %-97 %] 96 % (07/24 0535) Last BM Date : 06/29/22  Intake/Output from previous day: 07/23 0701 - 07/24 0700 In: -  Out: 50 [Stool:50] Intake/Output this shift: Total I/O In: -  Out: 50 [Stool:50]  PE: General appearance: alert and cooperative GI: soft, appropriately tender, ostomy with some mucocutaneous separation. Open midline wound with healthy granulation tissue   Lab Results:  No results for input(s): "WBC", "HGB", "HCT", "PLT" in the last 72 hours. BMET No results for input(s): "NA", "K", "CL", "CO2", "GLUCOSE", "BUN", "CREATININE", "CALCIUM" in the last 72 hours. PT/INR No results for input(s): "LABPROT", "INR" in the last 72 hours. CMP     Component Value Date/Time   NA 135 06/24/2022 0426   K 3.6 06/24/2022 0426   CL 102 06/24/2022 0426   CO2 23 06/24/2022 0426   GLUCOSE 95 06/24/2022 0426   BUN 16 06/24/2022 0426   CREATININE 0.63 06/24/2022 0426   CREATININE 0.65 06/24/2022 0426   CALCIUM 8.4 (L) 06/24/2022 0426   PROT 7.2 06/20/2022 1649   ALBUMIN 2.4 (L) 06/20/2022 1649   AST 22 06/20/2022 1649   ALT 24 06/20/2022 1649   ALKPHOS 127 (H) 06/20/2022 1649   BILITOT 0.4 06/20/2022 1649   GFRNONAA >60 06/24/2022 0426   GFRNONAA >60 06/24/2022 0426   GFRAA >90 11/16/2011 1455   Lipase     Component Value Date/Time   LIPASE 23 05/31/2022 2224       Studies/Results: No results found.  Anti-infectives: Anti-infectives (From admission, onward)    Start     Dose/Rate Route Frequency Ordered Stop   06/03/22 1400  piperacillin-tazobactam (ZOSYN) IVPB 3.375 g        3.375 g 12.5 mL/hr over 240 Minutes  Intravenous Every 8 hours 06/03/22 0900 06/16/22 1356   06/02/22 0000  cefTRIAXone (ROCEPHIN) 2 g in sodium chloride 0.9 % 100 mL IVPB  Status:  Discontinued        2 g 200 mL/hr over 30 Minutes Intravenous Every 24 hours 06/01/22 1302 06/03/22 0856   06/01/22 1400  metroNIDAZOLE (FLAGYL) IVPB 500 mg  Status:  Discontinued        500 mg 100 mL/hr over 60 Minutes Intravenous Every 12 hours 06/01/22 1302 06/03/22 0856   06/01/22 1200  cefTRIAXone (ROCEPHIN) 2 g in sodium chloride 0.9 % 100 mL IVPB  Status:  Discontinued        2 g 200 mL/hr over 30 Minutes Intravenous Every 24 hours 06/01/22 0432 06/01/22 0801   06/01/22 0900  Ampicillin-Sulbactam (UNASYN) 3 g in sodium chloride 0.9 % 100 mL IVPB  Status:  Discontinued        3 g 200 mL/hr over 30 Minutes Intravenous Every 6 hours 06/01/22 0801 06/01/22 1302   06/01/22 0030  cefTRIAXone (ROCEPHIN) 1 g in sodium chloride 0.9 % 100 mL IVPB        1 g 200 mL/hr over 30 Minutes Intravenous  Once 06/01/22 0027 06/01/22 0155   06/01/22 0030  metroNIDAZOLE (FLAGYL) IVPB 500 mg  Status:  Discontinued        500 mg 100 mL/hr over 60 Minutes Intravenous  Every 12 hours 06/01/22 0027 06/01/22 0801        Assessment/Plan POD#24 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 Barry Dienes  for Perforated diverticulitis with multiple abscesses - Path consistent with diverticulitis  - Abdominal staples removed 7/12. Continue vac MWF - Tolerating diet and ostomy functioning. Monitor mucocutaneous separation  - WOC following for new ostomy. Outpatient referral to ostomy clinic placed - S/p IR drain 7/6. Culture negative. Repeat CT scan 7/18 w/ near complete resolution of fluid collection. IR drain removed 7/19.  - TOC still working on dispo. Surgical follow up is in AVS. General surgery will sign off at this time as patient is surgically stable, please let us know if any concerns arise   ID - zosyn  6/28>>7/11. None currently. Afebrile.  FEN - soft diet VTE - lovenox Foley - none    LOS: 28 days    Norm Parcel, [facility] 06/29/2022, 10:21 AM Please see Amion for pager number during day hours 7:00am-4:30pm

## 2022-06-29 NOTE — Consult Note (Signed)
[facility] [place] Nurse wound follow up Vac dressing changed.  Pt was medicated for pain prior to the procedure and tolerated with minimal amt discomfort. Surgical PA at the bedside to assess wound appearance.  Removed 1 piece black foam.  Wound is beefy red with small amt bloody drainage upon sponge removal.  Used adhesive remover spay for drape and moistened with NS to assist with removal. Applied barrier ring pieces to wound edges located in significant left and right skin creases surrounding wound edges. Applied one piece foam back into wound. Cont suction on at 172m. WOC will plan to change dressing again on Wed if patient is still in the hospital at that time. Pt is awaiting discharge disposition and placement.   Ostomy follow-up: Changed ostomy pouch. Stoma is red and viable, slightly below skin level, 1 1/4 inches and oval. Applied barrier ring and one piece convex pouch.  Pt watched the procedure using a hand held mirror and asked appropriate questions.  She is able to open and close to empty independently.    5 sets of barrier rings (Kellie Simmering# [phone] and 5 convex pouches (Kellie Simmering# [phone] at the bedside for use or upon discharge. Reviewed pouching routines and ordering supplies.   DJulien GirtMSN, RN, [place]

## 2022-06-29 NOTE — TOC Progression Note (Signed)
Transition of Care [facility]) - Progression Note    Patient Details  Name: Morgan Cline MRN: [id number] Date of Birth: [birth year]  Transition of Care [facility]) CM/SW Contact  Leeroy Cha, RN Phone Number: 06/29/2022, 11:00 AM  Clinical Narrative:    Will check with [facility] about contract with [facility] to see if patient can go there.   Expected Discharge Plan: [place] Barriers to Discharge: No [place] will accept this patient  Expected Discharge Plan and Services Expected Discharge Plan: [place]   Discharge Planning Services: CM Consult Post Acute Care Choice: Durable Medical Equipment, Home Health   Expected Discharge Date: 06/17/22               DME Arranged: Negative pressure wound device DME Agency: [employer] Date DME Agency Contacted: 06/15/22 Time DME Agency Contacted: 58 Representative spoke with at DME Agency: tracey barrow HH Arranged: RN   Date [place]: 06/15/22       Social Determinants of Health ([place]) Interventions    Readmission Risk Interventions    06/01/2022   11:45 AM  Readmission Risk Prevention Plan  Post Dischage Appt Complete  Medication Screening Complete  Transportation Screening Complete

## 2022-06-29 NOTE — Progress Notes (Signed)
Physical Therapy Treatment Patient Details Name: Morgan Cline MRN: [id number] DOB: November 10, 1971 Today's Date: 06/29/2022   History of Present Illness 51 year old female with PMH of morbid obesity, prediabetes and migraine headache returning with worsening abdominal pain due to acute sigmoid diverticulitis despite oral antibiotics.  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  Post op, pt underwent a CT scan of the abdomen and pelvis 7/5 which showed a left-sided fluid collection.  She is status post IR guided drainage on 7/6. Completed Zosyn 06/16/2022.  Drain removed by IR on 06/24/2022.    PT Comments    General Comments: AxO x 3 motivated to get better.  Pt was premedicated for pain.  Assisted OOB required increased time.  General bed mobility comments: partial "log roll" tech with heavy use of rails and assist with upper body for supine to sit and assist with LB sit to supine due to recent ABD surgery.General Gait Details: slightly increased amb distance but heavy lean on walker and increased time.  Distance limited by fatigue/effort. Assisted back to bed. Pt will need ST Rehab at SNF to address mobility and functional decline prior to safely returning home.   Recommendations for follow up therapy are one component of a multi-disciplinary discharge planning process, led by the attending physician.  Recommendations may be updated based on patient status, additional functional criteria and insurance authorization.  Follow Up Recommendations  Skilled nursing-short term rehab (<3 hours/day)     Assistance Recommended at Discharge Intermittent Supervision/Assistance  Patient can return home with the following A little help with walking and/or transfers;A little help with bathing/dressing/bathroom;Help with stairs or ramp for entrance;Assistance with cooking/housework;Assist for transportation   Equipment Recommendations  None  recommended by PT    Recommendations for Other Services       Precautions / Restrictions Precautions Precautions: Fall Precaution Comments: colostomy, ABD VAC Restrictions Weight Bearing Restrictions: No     Mobility  Bed Mobility Overal bed mobility: Needs Assistance Bed Mobility: Supine to Sit, Rolling, Sit to Supine Rolling: Min assist Sidelying to sit: Mod assist Supine to sit: Mod assist, HOB elevated Sit to supine: Mod assist   General bed mobility comments: partial "log roll" tech with heavy use of rails and assist with upper body for supine to sit and assist with LB sit to supine due to recent ABD surgery.    Transfers Overall transfer level: Needs assistance Equipment used: Rolling walker (2 wheels) Transfers: Sit to/from Stand Sit to Stand: Min guard, From elevated surface           General transfer comment: increased time, VCs for technique    Ambulation/Gait Ambulation/Gait assistance: Supervision, Min guard Gait Distance (Feet): 73 Feet Assistive device: Rolling walker (2 wheels) Gait Pattern/deviations: Step-through pattern, Decreased stride length, Trunk flexed Gait velocity: decreased     General Gait Details: slightly increased amb distance but heavy lean on walker and increased time.  Distance limited by fatigue/effort.   Stairs             Wheelchair Mobility    Modified Rankin (Stroke Patients Only)       Balance                                            Cognition Arousal/Alertness: Awake/alert Behavior During Therapy: WFL for tasks assessed/performed Overall Cognitive Status: Within  Functional Limits for tasks assessed                                 General Comments: AxO x 3 motivated to get better        Exercises      General Comments        Pertinent Vitals/Pain Pain Assessment Pain Assessment: Faces Faces Pain Scale: Hurts little more Pain Location: abdomen with  activity Pain Descriptors / Indicators: Discomfort, Grimacing Pain Intervention(s): Monitored during session    Home Living                          Prior Function            PT Goals (current goals can now be found in the care plan section) Progress towards PT goals: Progressing toward goals    Frequency    Min 3X/week      PT Plan Current plan remains appropriate    Co-evaluation              AM-PAC PT "6 Clicks" Mobility   Outcome Measure  Help needed turning from your back to your side while in a flat bed without using bedrails?: A Little Help needed moving from lying on your back to sitting on the side of a flat bed without using bedrails?: A Little Help needed moving to and from a bed to a chair (including a wheelchair)?: A Little Help needed standing up from a chair using your arms (e.g., wheelchair or bedside chair)?: A Lot Help needed to walk in hospital room?: A Lot Help needed climbing 3-5 steps with a railing? : A Lot 6 Click Score: 15    End of Session Equipment Utilized During Treatment: Gait belt Activity Tolerance: Patient limited by fatigue Patient left: with call bell/phone within reach;in bed;with bed alarm set Nurse Communication: Mobility status PT Visit Diagnosis: Difficulty in walking, not elsewhere classified (R26.2)     Time: 3382-5053 PT Time Calculation (min) (ACUTE ONLY): 26 min  Charges:  $Gait Training: 8-22 mins $Therapeutic Activity: 8-22 mins                     {Margel Joens  PTA Acute  [employer] M-F          [phone] Weekend pager [phone]

## 2022-06-29 NOTE — Progress Notes (Signed)
PROGRESS NOTE    Morgan Cline  VZC:[id number] DOB: 06-Sep-1971 DOA: 05/31/2022 PCP: [facility]   Brief Narrative: 51 year old female with history of prediabetes, obesity, comes to the hospital with abdominal pain.  She was recently seen in the ED couple days prior to admission with sigmoid diverticulitis, she was given Augmentin and sent home, however returned due to worsening pain.  A CT scan showed worsening perforated diverticulitis with abscess formation.  General surgery was consulted and eventually underwent Hartman's procedure on 6/30.  Assessment & Plan:   Principal Problem:   Perforation of sigmoid colon due to diverticulitis Active Problems:   Obesity, Class III, BMI 40-49.9 (morbid obesity) (HCC)   Prediabetes   Hyponatremia   Hypokalemia   Bandemia   Normocytic anemia    Sigmoid colon perforation due to diverticulitis, multiple abscesses, postop ileus-appreciate general surgery follow-up.  She is status post sigmoid colectomy and end colostomy, takedown of splenic flexure by Dr. Barry Dienes on 6/30.  Postop course complicated by ileus, NG tube, now resolved, NG tube was removed in the interval and she is eating well.  She had persistent WBC elevation and underwent a CT scan of the abdomen and pelvis 7/5 which showed a left-sided fluid collection.  She is status post IR guided drainage on 7/6.  WBC continues to improve, cultures without growth.  Completed Zosyn 06/16/2022.  Drain removed by IR on 06/24/2022.  Wound care on board for abdominal wound.  She has been cleared by general surgery for discharge. Still waiting on safe disposition to SNF.  [place] working.   Active problems  Hyponatremia-resolved Hypokalemia -resolved Thrombocytosis -likely reactive monitor Normocytic Anemia -no bleeding, monitor   Pre-Diabetes-CBGs with good control on Ozempic prior to admission CBG (last 3)  Recent Labs    06/29/22 0010 06/29/22 0748 06/29/22 1138  GLUCAP 129* 110* 111*     Tachycardia-ekg normal sinus rhythm Chest xray bibasilar atelectasis Echo normal ejection fraction Low dose metoprolol     Estimated body mass index is 41.47 kg/m as calculated from the following:   Height as of this encounter: '5\' 4"'$  (1.626 m).   Weight as of this encounter: 109.6 kg.  DVT prophylaxis: Lovenox  code Status: Full code Family Communication: None Disposition Plan:  Status is: Inpatient Remains inpatient appropriate because: Await safe DC plan to SNF, Medically stable to be discharged Consultants:  General surgery  Subjective: Seen and examined.  No complaints. objective: Vitals:   06/28/22 0448 06/28/22 1229 06/28/22 2005 06/29/22 0535  BP: 103/62 113/75 122/78 103/82  Pulse: 91 96 89 91  Resp: '20 20 18 20  '$ Temp: 98.3 F (36.8 C) 98.9 F (37.2 C) 98.5 F (36.9 C) 98.3 F (36.8 C)  TempSrc: Oral Oral Oral Oral  SpO2: 98% 96% 97% 96%  Weight:      Height:        Intake/Output Summary (Last 24 hours) at 06/29/2022 1250 Last data filed at 06/29/2022 0900 Gross per 24 hour  Intake 273 ml  Output 100 ml  Net 173 ml    Filed Weights   06/01/22 0410 06/05/22 1154 06/22/22 0541  Weight: 115.3 kg 106.6 kg 109.6 kg    Examination:on RA  General exam: Appears calm and comfortable  Respiratory system: Clear to auscultation. Respiratory effort normal. Cardiovascular system: S1 & S2 heard, RRR. No JVD, murmurs, rubs, gallops or clicks. No pedal edema. Gastrointestinal system: Abdomen is nondistended, soft and nontender. No organomegaly or masses felt. Normal bowel sounds heard.  Ostomy noted.  Wound VAC attached. Central nervous system: Alert and oriented. No focal neurological deficits. Extremities: Symmetric 5 x 5 power. Skin: No rashes, lesions or ulcers.  Psychiatry: Judgement and insight appear normal. Mood & affect appropriate.   Data Reviewed: I have personally reviewed following labs and imaging studies  CBC: Recent Labs  Lab  06/23/22 2040 06/24/22 0426  WBC 6.3 5.3  NEUTROABS  --  2.7  HGB 9.3* 9.4*  HCT 30.0* 30.8*  MCV 88.8 91.7  PLT 400 403*    Basic Metabolic Panel: Recent Labs  Lab 06/24/22 0426  NA 135  K 3.6  CL 102  CO2 23  GLUCOSE 95  BUN 16  CREATININE 0.65  0.63  CALCIUM 8.4*    GFR: Estimated Creatinine Clearance: 104.1 mL/min (by C-G formula based on SCr of 0.63 mg/dL). Liver Function Tests: No results for input(s): "AST", "ALT", "ALKPHOS", "BILITOT", "PROT", "ALBUMIN" in the last 168 hours.  No results for input(s): "LIPASE", "AMYLASE" in the last 168 hours. No results for input(s): "AMMONIA" in the last 168 hours. Coagulation Profile: No results for input(s): "INR", "PROTIME" in the last 168 hours. Cardiac Enzymes: No results for input(s): "CKTOTAL", "CKMB", "CKMBINDEX", "TROPONINI" in the last 168 hours. BNP (last 3 results) No results for input(s): "PROBNP" in the last 8760 hours. HbA1C: No results for input(s): "HGBA1C" in the last 72 hours. CBG: Recent Labs  Lab 06/28/22 0737 06/28/22 1640 06/29/22 0010 06/29/22 0748 06/29/22 1138  GLUCAP 108* 135* 129* 110* 111*    Lipid Profile: No results for input(s): "CHOL", "HDL", "LDLCALC", "TRIG", "CHOLHDL", "LDLDIRECT" in the last 72 hours. Thyroid Function Tests: No results for input(s): "TSH", "T4TOTAL", "FREET4", "T3FREE", "THYROIDAB" in the last 72 hours. Anemia Panel: No results for input(s): "VITAMINB12", "FOLATE", "FERRITIN", "TIBC", "IRON", "RETICCTPCT" in the last 72 hours. Sepsis Labs: No results for input(s): "PROCALCITON", "LATICACIDVEN" in the last 168 hours.  No results found for this or any previous visit (from the past 240 hour(s)).  Radiology Studies: No results found.  Scheduled Meds:  acetaminophen  1,000 mg Oral Q6H   enoxaparin (LOVENOX) injection  40 mg Subcutaneous Q24H   feeding supplement  237 mL Oral BID BM   hydrocortisone cream   Topical QID   methocarbamol  500 mg Oral Q6H    metoprolol succinate  12.5 mg Oral Daily   Continuous Infusions:  promethazine (PHENERGAN) injection (IM or IVPB)      LOS: 28 days   Darliss Cheney, MD 06/29/2022, 12:50 PM

## 2022-06-30 DIAGNOSIS — K5720 Diverticulitis of large intestine with perforation and abscess without bleeding: Secondary | ICD-10-CM | POA: Diagnosis not present

## 2022-06-30 LAB — GLUCOSE, CAPILLARY
Glucose-Capillary: 131 mg/dL — ABNORMAL HIGH (ref 70–99)
Glucose-Capillary: 139 mg/dL — ABNORMAL HIGH (ref 70–99)
Glucose-Capillary: 132 mg/dL — ABNORMAL HIGH (ref 70–99)

## 2022-06-30 NOTE — TOC Progression Note (Signed)
Transition of Care [facility]) - Progression Note    Patient Details  Name: Morgan Cline MRN: [id number] Date of Birth: 1971-08-10  [facility]) CM/SW Contact  Joaquin Courts, RN Phone Number: 06/30/2022, 1:59 PM  Clinical Narrative:    CM received notification from [facility] that patient had outreached to their care coordinator and expressed that she no longer wishes to go to [facility] and requests an alternative facility.  CM discussed that no other facilities have offered in [employer]  CM spoke with patient who agreed to expand search to [place] counties.    CM followed up with patient and presented the newest bed offer from [facility] which patient accepted.  Facilioty rep notified, have requested facility order a wound vac in anticipation of patient's arrival.  Have outreached to [employer] cc Cheral Almas and provided facility contact info in order to complete required contracts.  At this time [facility] is waiting the completion of contracts between facility/ [employer], patient will also need to sign a contract regarding her agreement to receive flexible benefits and facility needs to have wound vac delivered to site.     Expected Discharge Plan: [place] Barriers to Discharge: No [place] will accept this patient  Expected Discharge Plan and Services Expected Discharge Plan: [place]   Discharge Planning Services: CM Consult Post Acute Care Choice: Durable Medical Equipment, Home Health   Expected Discharge Date: 06/17/22               DME Arranged: Negative pressure wound device DME Agency: [employer] Date DME Agency Contacted: 06/15/22 Time DME Agency Contacted: 71 Representative spoke with at DME Agency: tracey barrow HH Arranged: RN   Date [place]: 06/15/22       Social Determinants of Health ([place]) Interventions    Readmission Risk Interventions    06/01/2022   11:45 AM   Readmission Risk Prevention Plan  Post Dischage Appt Complete  Medication Screening Complete  Transportation Screening Complete

## 2022-06-30 NOTE — Progress Notes (Signed)
PROGRESS NOTE    Morgan Cline  YSA:[id number] DOB: 08/19/1971 DOA: 05/31/2022 [facility]: [facility]   Brief Narrative: 51 year old female with history of prediabetes, obesity, comes to the hospital with abdominal pain.  She was recently seen in the ED couple days prior to admission with sigmoid diverticulitis, she was given Augmentin and sent home, however returned due to worsening pain.  A CT scan showed worsening perforated diverticulitis with abscess formation.  General surgery was consulted and eventually underwent Hartman's procedure on 6/30.  Assessment & Plan:   Principal Problem:   Perforation of sigmoid colon due to diverticulitis Active Problems:   Obesity, Class III, BMI 40-49.9 (morbid obesity) (HCC)   Prediabetes   Hyponatremia   Hypokalemia   Bandemia   Normocytic anemia    Sigmoid colon perforation due to diverticulitis, multiple abscesses, postop ileus-appreciate general surgery follow-up.  She is status post sigmoid colectomy and end colostomy, takedown of splenic flexure by Dr. Barry Dienes on 6/30.  Postop course complicated by ileus, NG tube, now resolved, NG tube was removed in the interval and she is eating well.  She had persistent WBC elevation and underwent a CT scan of the abdomen and pelvis 7/5 which showed a left-sided fluid collection.  She is status post IR guided drainage on 7/6.  WBC continues to improve, cultures without growth.  Completed Zosyn 06/16/2022.  Drain removed by IR on 06/24/2022.  Wound care on board for abdominal wound.  She has been cleared by general surgery for discharge. Still waiting on safe disposition to SNF.  [place] working.   Active problems  Hyponatremia-resolved Hypokalemia -resolved Thrombocytosis -likely reactive monitor Normocytic Anemia -no bleeding, monitor   Pre-Diabetes-CBGs with good control on Ozempic prior to admission CBG (last 3)  Recent Labs    06/29/22 1629 06/29/22 2332 06/30/22 0743  GLUCAP 129* 111* 131*     Tachycardia-ekg normal sinus rhythm Chest xray bibasilar atelectasis Echo normal ejection fraction Low dose metoprolol     Estimated body mass index is 41.47 kg/m as calculated from the following:   Height as of this encounter: '5\' 4"'$  (1.626 m).   Weight as of this encounter: 109.6 kg.  DVT prophylaxis: Lovenox  code Status: Full code Family Communication: None Disposition Plan:  Status is: Inpatient Remains inpatient appropriate because: Await safe DC plan to SNF, Medically stable to be discharged Consultants:  General surgery  Subjective: Patient seen and examined.  She has no complaints.  This morning when I talked to her, she was telling me that she has made her mind to go to [facility] although she really wanted to go to [place] but then later on, TOC informed me that she does not want to go to [facility] anymore.  objective: Vitals:   06/29/22 0535 06/29/22 1326 06/29/22 2106 06/30/22 0643  BP: 103/82 1'06/76 96/65 99/68 '$  Pulse: 91 (!) 102 96 88  Resp: '20 18  19  '$ Temp: 98.3 F (36.8 C) 98.4 F (36.9 C) 98.1 F (36.7 C) 98.3 F (36.8 C)  TempSrc: Oral Oral Oral Oral  SpO2: 96% 97% 98% 97%  Weight:      Height:        Intake/Output Summary (Last 24 hours) at 06/30/2022 1051 Last data filed at 06/30/2022 0600 Gross per 24 hour  Intake 580 ml  Output --  Net 580 ml    Filed Weights   06/01/22 0410 06/05/22 1154 06/22/22 0541  Weight: 115.3 kg 106.6 kg 109.6 kg  Examination:on RA  General exam: Appears calm and comfortable  Respiratory system: Clear to auscultation. Respiratory effort normal. Cardiovascular system: S1 & S2 heard, RRR. No JVD, murmurs, rubs, gallops or clicks. No pedal edema. Gastrointestinal system: Abdomen is nondistended, soft and nontender. No organomegaly or masses felt. Normal bowel sounds heard.  Ostomy noted.  Wound VAC attached. Central nervous system: Alert and oriented. No focal neurological  deficits. Extremities: Symmetric 5 x 5 power. Skin: No rashes, lesions or ulcers.  Psychiatry: Judgement and insight appear normal. Mood & affect appropriate.    Data Reviewed: I have personally reviewed following labs and imaging studies  CBC: Recent Labs  Lab 06/23/22 2040 06/24/22 0426  WBC 6.3 5.3  NEUTROABS  --  2.7  HGB 9.3* 9.4*  HCT 30.0* 30.8*  MCV 88.8 91.7  PLT 400 403*    Basic Metabolic Panel: Recent Labs  Lab 06/24/22 0426  NA 135  K 3.6  CL 102  CO2 23  GLUCOSE 95  BUN 16  CREATININE 0.65  0.63  CALCIUM 8.4*    GFR: Estimated Creatinine Clearance: 104.1 mL/min (by C-G formula based on SCr of 0.63 mg/dL). Liver Function Tests: No results for input(s): "AST", "ALT", "ALKPHOS", "BILITOT", "PROT", "ALBUMIN" in the last 168 hours.  No results for input(s): "LIPASE", "AMYLASE" in the last 168 hours. No results for input(s): "AMMONIA" in the last 168 hours. Coagulation Profile: No results for input(s): "INR", "PROTIME" in the last 168 hours. Cardiac Enzymes: No results for input(s): "CKTOTAL", "CKMB", "CKMBINDEX", "TROPONINI" in the last 168 hours. BNP (last 3 results) No results for input(s): "PROBNP" in the last 8760 hours. HbA1C: No results for input(s): "HGBA1C" in the last 72 hours. CBG: Recent Labs  Lab 06/29/22 0748 06/29/22 1138 06/29/22 1629 06/29/22 2332 06/30/22 0743  GLUCAP 110* 111* 129* 111* 131*    Lipid Profile: No results for input(s): "CHOL", "HDL", "LDLCALC", "TRIG", "CHOLHDL", "LDLDIRECT" in the last 72 hours. Thyroid Function Tests: No results for input(s): "TSH", "T4TOTAL", "FREET4", "T3FREE", "THYROIDAB" in the last 72 hours. Anemia Panel: No results for input(s): "VITAMINB12", "FOLATE", "FERRITIN", "TIBC", "IRON", "RETICCTPCT" in the last 72 hours. Sepsis Labs: No results for input(s): "PROCALCITON", "LATICACIDVEN" in the last 168 hours.  No results found for this or any previous visit (from the past 240  hour(s)).  Radiology Studies: No results found.  Scheduled Meds:  acetaminophen  1,000 mg Oral Q6H   enoxaparin (LOVENOX) injection  40 mg Subcutaneous Q24H   feeding supplement  237 mL Oral BID BM   hydrocortisone cream   Topical QID   methocarbamol  500 mg Oral Q6H   metoprolol succinate  12.5 mg Oral Daily   Continuous Infusions:  promethazine (PHENERGAN) injection (IM or IVPB)      LOS: 29 days   Darliss Cheney, MD 06/30/2022, 10:51 AM

## 2022-06-30 NOTE — TOC Progression Note (Signed)
Transition of Care [facility]) - Progression Note    Patient Details  Name: Morgan Cline MRN: [id number] Date of Birth: 1970/12/17  [facility]) CM/SW Contact  Leeroy Cha, RN Phone Number: 06/30/2022, 2:55 PM  Clinical Narrative:    Insurance auth fax sent successfully to [employer] at [employer].   Expected Discharge Plan: [place] Barriers to Discharge: No [place] will accept this patient  Expected Discharge Plan and Services Expected Discharge Plan: [place]   Discharge Planning Services: CM Consult Post Acute Care Choice: Durable Medical Equipment, Home Health   Expected Discharge Date: 06/17/22               DME Arranged: Negative pressure wound device DME Agency: [employer] Date DME Agency Contacted: 06/15/22 Time DME Agency Contacted: 41 Representative spoke with at DME Agency: tracey barrow HH Arranged: RN   Date [place]: 06/15/22       Social Determinants of Health ([place]) Interventions    Readmission Risk Interventions    06/01/2022   11:45 AM  Readmission Risk Prevention Plan  Post Dischage Appt Complete  Medication Screening Complete  Transportation Screening Complete

## 2022-06-30 NOTE — TOC Progression Note (Signed)
Transition of Care [facility]) - Progression Note    Patient Details  Name: Morgan Cline MRN: [id number] Date of Birth: 1971-04-01  Transition of Care [facility]) CM/SW Contact  Joaquin Courts, RN Phone Number: 06/30/2022, 4:35 PM  Clinical Narrative:    CM received confirmation that all contracts are in place.  CM spoke with [place] rep tanya who reports wound vac device will be at the facility tomorrow and patient can transfer once vac has arrived. TOC will continue to follow.   Expected Discharge Plan: [place] Barriers to Discharge: No [place] will accept this patient  Expected Discharge Plan and Services Expected Discharge Plan: [place]   Discharge Planning Services: CM Consult Post Acute Care Choice: Durable Medical Equipment, Home Health   Expected Discharge Date: 06/17/22               DME Arranged: Negative pressure wound device DME Agency: [employer] Date DME Agency Contacted: 06/15/22 Time DME Agency Contacted: 35 Representative spoke with at DME Agency: tracey barrow HH Arranged: RN   Date [place]: 06/15/22       Social Determinants of Health ([place]) Interventions    Readmission Risk Interventions    06/01/2022   11:45 AM  Readmission Risk Prevention Plan  Post Dischage Appt Complete  Medication Screening Complete  Transportation Screening Complete

## 2022-06-30 NOTE — Progress Notes (Signed)
Occupational Therapy Treatment Patient Details Name: Morgan Cline MRN: [id number] DOB: [birth year] Today's Date: 06/30/2022   History of present illness 51 year old female with PMH of morbid obesity, prediabetes and migraine headache returning with worsening abdominal pain due to acute sigmoid diverticulitis despite oral antibiotics.  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  Post op, pt underwent a CT scan of the abdomen and pelvis 7/5 which showed a left-sided fluid collection.  She is status post IR guided drainage on 7/6. Completed Zosyn 06/16/2022.  Drain removed by IR on 06/24/2022.   OT comments  Patient was noted to have improvement in standing balance during attempted reaching bottom for hygiene tasks on this date. Patient was able to complete bathing tasks with min A for UB and mod A for LB bathing tasks. Patient was educated on toileting buddy. Patient reported friend was going to order one for her. Patient's discharge plan remains appropriate at this time. OT will continue to follow acutely.     Recommendations for follow up therapy are one component of a multi-disciplinary discharge planning process, led by the attending physician.  Recommendations may be updated based on patient status, additional functional criteria and insurance authorization.    Follow Up Recommendations  Skilled nursing-short term rehab (<3 hours/day)    Assistance Recommended at Discharge Frequent or constant Supervision/Assistance  Patient can return home with the following  A little help with walking and/or transfers;A lot of help with bathing/dressing/bathroom;Assistance with cooking/housework;Help with stairs or ramp for entrance   Equipment Recommendations  BSC/3in1 (bariatric)    Recommendations for Other Services      Precautions / Restrictions Precautions Precautions: Fall Precaution Comments: colostomy, ABD  VAC Restrictions Weight Bearing Restrictions: No       Mobility Bed Mobility Overal bed mobility: Needs Assistance Bed Mobility: Supine to Sit, Rolling, Sit to Supine Rolling: Min assist Sidelying to sit: Mod assist       General bed mobility comments: patient needed physical assistance to participate in rolling and coming into midline on EOB    Transfers                         Balance Overall balance assessment: Needs assistance Sitting-balance support: No upper extremity supported, Feet supported Sitting balance-Leahy Scale: Good     Standing balance support: During functional activity, Single extremity supported, Reliant on assistive device for balance Standing balance-Leahy Scale: Fair                             ADL either performed or assessed with clinical judgement   ADL Overall ADL's : Needs assistance/impaired     Grooming: Set up;Sitting;Wash/dry hands   Upper Body Bathing: Minimal assistance;Sitting Upper Body Bathing Details (indicate cue type and reason): was able to lift folds to clean herself on this date which is an improvement from previous sessions. Lower Body Bathing: Moderate assistance Lower Body Bathing Details (indicate cue type and reason): patient was able to wash tops of thights, perineal area, and portion of buttox. patietn was educated on toileting buddy again with patient reported friend was going to order it for her. Upper Body Dressing : Set up;Sitting       Toilet Transfer: Min guard;Regular Toilet;BSC/3in1;Rolling walker (2 wheels);Stand-pivot;Supervision/safety                  Extremity/Trunk Assessment  Vision       Perception     Praxis      Cognition Arousal/Alertness: Awake/alert Behavior During Therapy: WFL for tasks assessed/performed Overall Cognitive Status: Within Functional Limits for tasks assessed                                 General Comments:  motivated to get better        Exercises      Shoulder Instructions       General Comments      Pertinent Vitals/ Pain       Pain Assessment Pain Assessment: Faces Faces Pain Scale: Hurts little more Pain Location: abdomen with activity Pain Descriptors / Indicators: Discomfort, Grimacing Pain Intervention(s): Monitored during session, Limited activity within patient's tolerance  Home Living                                          Prior Functioning/Environment              Frequency  Min 2X/week        Progress Toward Goals  OT Goals(current goals can now be found in the care plan section)  Progress towards OT goals: Progressing toward goals     Plan Discharge plan needs to be updated    Co-evaluation                 AM-PAC OT "6 Clicks" Daily Activity     Outcome Measure   Help from another person eating meals?: None Help from another person taking care of personal grooming?: A Little Help from another person toileting, which includes using toliet, bedpan, or urinal?: A Lot Help from another person bathing (including washing, rinsing, drying)?: A Lot Help from another person to put on and taking off regular upper body clothing?: A Little Help from another person to put on and taking off regular lower body clothing?: A Little 6 Click Score: 17    End of Session Equipment Utilized During Treatment: Rolling walker (2 wheels)  OT Visit Diagnosis: Pain   Activity Tolerance Patient limited by pain   Patient Left in chair;with call bell/phone within reach;with family/visitor present   Nurse Communication          Time: 2993-7169 OT Time Calculation (min): 46 min  Charges: OT General Charges $OT Visit: 1 Visit OT Treatments $Self Care/Home Management : 38-52 mins  [name] OTR/L, MS Acute Rehabilitation Department Office# [phone] Pager# [phone]   [name] 06/30/2022, 12:06 PM

## 2022-06-30 NOTE — Progress Notes (Signed)
   06/30/22 1512  Mobility  Activity Ambulated with assistance in hallway  Range of Motion/Exercises Active  Level of Assistance Minimal assist, patient does 75% or more  Assistive Device Front wheel walker  Distance Ambulated (ft) 80 ft  Activity Response Tolerated well  Transport method Ambulatory  $Mobility charge 1 Mobility   Pt was agreeable to mobilize. No assist needed to get out of bed. Pt ambulated 1f and stated she felt stronger. Assisted w/ use of bedside commode and positioning back into bed. Pt left with necessities in reach & family in room.   [facility]

## 2022-07-01 LAB — CREATININE, SERUM
Creatinine, Ser: 0.73 mg/dL (ref 0.44–1.00)
GFR, Estimated: >60 mL/min (ref >60)

## 2022-07-01 LAB — GLUCOSE, CAPILLARY
Glucose-Capillary: 110 mg/dL — ABNORMAL HIGH (ref 70–99)
Glucose-Capillary: 119 mg/dL — ABNORMAL HIGH (ref 70–99)

## 2022-07-01 MED ORDER — OXYCODONE HCL 5 MG PO TABS: 5.0000 mg | ORAL_TABLET | Freq: Four times a day (QID) | ORAL | 0 refills | Status: DC | PRN

## 2022-07-01 MED ORDER — METOPROLOL SUCCINATE ER 25 MG PO TB24: 12.5000 mg | ORAL_TABLET | Freq: Every day | ORAL | Status: DC

## 2022-07-01 MED ORDER — ALPRAZOLAM 0.5 MG PO TABS: 0.5000 mg | ORAL_TABLET | Freq: Two times a day (BID) | ORAL | 0 refills | Status: DC | PRN

## 2022-07-01 NOTE — Discharge Summary (Signed)
Physician Discharge Summary  Morgan Cline HFW:[id number] DOB: 06-Aug-1971 DOA: 05/31/2022  [facility]: [facility]  Admit date: 05/31/2022 Discharge date: 07/01/2022  Admitted From: home Discharge disposition: SNF   Recommendations for Outpatient Follow-Up:   Continue vac MWF Outpatient referral to ostomy clinic placed   Discharge Diagnosis:   Principal Problem:   Perforation of sigmoid colon due to diverticulitis Active Problems:   Obesity, Class III, BMI 40-49.9 (morbid obesity) ([place])   Prediabetes   Hyponatremia   Hypokalemia   Bandemia   Normocytic anemia    Discharge Condition: Improved.  Diet recommendation: Low sodium, heart healthy   Code status: Full.   History of Present Illness:   Morgan Cline is a 51 y.o. female with no significant past medical history has been experiencing abdominal pain for the last 2 to 3 days.  Had come to the ER about 48 hours ago at that time CT scan showed features concerning for sigmoid diverticulitis was discharged home on oral antibiotics.  Despite taking which patient symptoms worsen and presents back to the ER.  Denies any nausea vomiting or diarrhea.  Pain is mostly localized to the left lower quadrant.   Hospital Course by Problem:   Sigmoid colon perforation due to diverticulitis, multiple abscesses, postop ileus-appreciate general surgery follow-up.  She is status post sigmoid colectomy and end colostomy, takedown of splenic flexure by Dr. Barry Dienes on 6/30.  Postop course complicated by ileus, NG tube, now resolved -CT scan of the abdomen and pelvis 7/5 which showed a left-sided fluid collection.  She is status post IR guided drainage on 7/6.  WBC continues to improve, cultures without growth.  Completed Zosyn 06/16/2022.  Drain removed by IR on 06/24/2022.  Wound care on board for abdominal wound.  She has been cleared by general surgery for discharge. D/c to SNF    Hyponatremia-resolved Hypokalemia  -resolved Thrombocytosis -likely reactive Normocytic Anemia -no bleeding, monitor  Obesity Estimated body mass index is 41.47 kg/m as calculated from the following:   Height as of this encounter: '5\' 4"'$  (1.626 m).   Weight as of this encounter: 109.6 kg.    Medical Consultants:   General surgery   Discharge Exam:   Vitals:   06/30/22 2136 07/01/22 0557  BP: 98/63 107/75  Pulse: (!) 106 (!) 104  Resp:    Temp: 98.9 F (37.2 C) 98.5 F (36.9 C)  SpO2: 96% 97%   Vitals:   06/30/22 0643 06/30/22 1341 06/30/22 2136 07/01/22 0557  BP: 99/68 109/72 98/63 107/75  Pulse: 88 95 (!) 106 (!) 104  Resp: 19 18    Temp: 98.3 F (36.8 C) 98.7 F (37.1 C) 98.9 F (37.2 C) 98.5 F (36.9 C)  TempSrc: Oral Oral Oral Oral  SpO2: 97% 97% 96% 97%  Weight:      Height:        General exam: Appears calm and comfortable.  .    The results of significant diagnostics from this hospitalization (including imaging, microbiology, ancillary and laboratory) are listed below for reference.     Procedures and Diagnostic Studies:   DG Abd Portable 1V  Result Date: 06/01/2022 CLINICAL DATA:  Abdominal pain EXAM: PORTABLE ABDOMEN - 1 VIEW COMPARISON:  None Available. FINDINGS: Examination is technically difficult due to the patient's body habitus. As far as seen bowel gas pattern is nonspecific. No abnormal masses or calcifications are seen. IUD is seen in the pelvis. Degenerative changes are noted in the lumbar  spine. IMPRESSION: Nonspecific bowel gas pattern.  Lumbar spondylosis. Electronically Signed   By: Elmer Picker M.D.   On: 06/01/2022 11:36   CT ABDOMEN PELVIS W CONTRAST  Result Date: 06/01/2022 CLINICAL DATA:  Acute abdominal pain, history of diverticulitis EXAM: CT ABDOMEN AND PELVIS WITH CONTRAST TECHNIQUE: Multidetector CT imaging of the abdomen and pelvis was performed using the standard protocol following bolus administration of intravenous contrast. RADIATION DOSE REDUCTION:  This exam was performed according to the departmental dose-optimization program which includes automated exposure control, adjustment of the mA and/or kV according to patient size and/or use of iterative reconstruction technique. CONTRAST:  157m OMNIPAQUE IOHEXOL 300 MG/ML  SOLN COMPARISON:  05/29/2022 FINDINGS: Lower chest: Mild atelectatic changes are noted in the bases. Hepatobiliary: No focal liver abnormality is seen. No gallstones, gallbladder wall thickening, or biliary dilatation. Pancreas: Unremarkable. No pancreatic ductal dilatation or surrounding inflammatory changes. Spleen: Normal in size without focal abnormality. Adrenals/Urinary Tract: Adrenal glands are within normal limits. Kidneys demonstrate a normal enhancement pattern bilaterally. Small nonobstructing left renal stone is noted. The ureters are within normal limits. The bladder is well distended. Stomach/Bowel: Colon again demonstrates changes of sigmoid diverticulitis. The degree of inflammatory change has increased in the interval from the prior exam. Multiple small foci of free air are noted within the abdomen consistent with micro perforations. This is consistent with the progressive inflammatory change. More proximal colon is unremarkable. The appendix is within normal limits. Small bowel and stomach are within normal limits. Vascular/Lymphatic: No significant vascular findings are present. No enlarged abdominal or pelvic lymph nodes. Reproductive: Large uterine fibroid is again identified. IUD is noted in place. Other: Small fat containing umbilical hernia is noted. New free fluid is noted in the pelvis likely related to the perforations. Musculoskeletal: Degenerative changes of lumbar spine are noted. No acute bony abnormality is seen. IMPRESSION: Increase in the degree of sigmoid diverticulitis with evidence of new micro perforations and free air. Mild increase in free fluid is noted within the pelvis. No abscess is seen at this time.  Dominant fibroid with IUD in place. Left renal calculus without obstructive change. Electronically Signed   By: MInez CatalinaM.D.   On: 06/01/2022 00:16     Labs:   Basic Metabolic Panel: Recent Labs  Lab 07/01/22 0416  CREATININE 0.73   GFR Estimated Creatinine Clearance: 104.1 mL/min (by C-G formula based on SCr of 0.73 mg/dL). Liver Function Tests: No results for input(s): "AST", "ALT", "ALKPHOS", "BILITOT", "PROT", "ALBUMIN" in the last 168 hours. No results for input(s): "LIPASE", "AMYLASE" in the last 168 hours. No results for input(s): "AMMONIA" in the last 168 hours. Coagulation profile No results for input(s): "INR", "PROTIME" in the last 168 hours.  CBC: No results for input(s): "WBC", "NEUTROABS", "HGB", "HCT", "MCV", "PLT" in the last 168 hours. Cardiac Enzymes: No results for input(s): "CKTOTAL", "CKMB", "CKMBINDEX", "TROPONINI" in the last 168 hours. BNP: Invalid input(s): "POCBNP" CBG: Recent Labs  Lab 06/30/22 0743 06/30/22 1614 06/30/22 2329 07/01/22 0547 07/01/22 0809  GLUCAP 131* 139* 132* 110* 119*   D-Dimer No results for input(s): "DDIMER" in the last 72 hours. Hgb A1c No results for input(s): "HGBA1C" in the last 72 hours. Lipid Profile No results for input(s): "CHOL", "HDL", "LDLCALC", "TRIG", "CHOLHDL", "LDLDIRECT" in the last 72 hours. Thyroid function studies No results for input(s): "TSH", "T4TOTAL", "T3FREE", "THYROIDAB" in the last 72 hours.  Invalid input(s): "FREET3" Anemia work up No results for input(s): "VITAMINB12", "FOLATE", "FERRITIN", "TIBC", "IRON", "  RETICCTPCT" in the last 72 hours. Microbiology No results found for this or any previous visit (from the past 240 hour(s)).   Discharge Instructions:   Discharge Instructions     Amb Referral to Ostomy Clinic   Complete by: As directed    Reason for referral modifiers:  Pre and post-operative counseling for ostomy management Information about lifestyle, intimacy, diet and  clothing options     Diet - low sodium heart healthy   Complete by: As directed    Discharge wound care:   Complete by: As directed    Wound vac 125 mmhg continuous suction   Discharge wound care:   Complete by: As directed    Continue wound vac   Increase activity slowly   Complete by: As directed    Increase activity slowly   Complete by: As directed       Allergies as of 07/01/2022       Reactions   Codeine Hives, Itching   Compazine [prochlorperazine] Hives, Itching   Pineapple Itching, Swelling   Lip swelling and itching        Medication List     STOP taking these medications    amoxicillin-clavulanate 875-125 MG tablet Commonly known as: AUGMENTIN   BC HEADACHE POWDER PO   HYDROcodone-acetaminophen 5-325 MG tablet Commonly known as: Norco       TAKE these medications    acetaminophen 500 MG tablet Commonly known as: TYLENOL Take 2 tablets (1,000 mg total) by mouth every 6 (six) hours.   ALPRAZolam 0.5 MG tablet Commonly known as: XANAX Take 1 tablet (0.5 mg total) by mouth 2 (two) times daily as needed for anxiety.   diphenhydramine-acetaminophen 25-500 MG Tabs tablet Commonly known as: TYLENOL PM Take 1 tablet by mouth at bedtime as needed (sleep).   Fiber Select Gummies Chew Chew 3 tablets by mouth at bedtime.   hydrocortisone cream 0.5 % Apply topically 4 (four) times daily.   ibuprofen 600 MG tablet Commonly known as: ADVIL Take 1 tablet (600 mg total) by mouth every 6 (six) hours as needed. What changed: reasons to take this   levonorgestrel 20 MCG/24HR IUD Commonly known as: MIRENA 1 each by Intrauterine route once. Implanted September 2020   methocarbamol 500 MG tablet Commonly known as: ROBAXIN Take 1 tablet (500 mg total) by mouth every 6 (six) hours.   metoprolol succinate 25 MG 24 hr tablet Commonly known as: TOPROL-XL Take 0.5 tablets (12.5 mg total) by mouth daily. Start taking on: July 02, 2022   ondansetron 4 MG  tablet Commonly known as: ZOFRAN Take 1 tablet (4 mg total) by mouth every 4 (four) hours as needed for nausea or vomiting.   oxyCODONE 5 MG immediate release tablet Commonly known as: Oxy IR/ROXICODONE Take 1-2 tablets (5-10 mg total) by mouth every 6 (six) hours as needed for moderate pain, severe pain or breakthrough pain.   Ozempic (1 MG/DOSE) 4 MG/3ML Sopn Generic drug: Semaglutide (1 MG/DOSE) Inject 1 mg into the skin every 'Sunday.               Durable Medical Equipment  (From admission, onward)           Start     Ordered   06/15/22 1113  For home use only DME Vac  Once        07'$ /10/23 1113              Discharge Care Instructions  (From admission, onward)  Start     Ordered   07/01/22 0000  Discharge wound care:       Comments: Continue wound vac   07/01/22 1204   06/17/22 0000  Discharge wound care:       Comments: Wound vac 125 mmhg continuous suction   06/17/22 6606            Follow-up Information     Stark Klein, MD. Go on 07/21/2022.   Specialty: General Surgery Why: Your appointment is at 9:30 AM. Arrive 49mn early to check in, fill out paperwork. Bring photo ID and iDoctor, general practiceinformation: [id number]lyFollow up.   Contact information: [phone]                  Time coordinating discharge: 45 min  Signed:  JGeradine GirtDO  [facility] 07/01/2022, 12:04 PM

## 2022-07-01 NOTE — Consult Note (Addendum)
[place] Nurse wound follow up Vac dressing changed.  Pt was medicated for pain prior to the procedure and tolerated with minimal amt discomfort. Removed 1 piece black foam.  Wound is beefy red with small amt bloody drainage upon sponge removal. 19X5X2 cm. Used adhesive remover spay for drape and moistened with NS to assist with removal. Applied barrier ring pieces to wound edges located in significant left and right skin creases surrounding wound edges. Applied one piece foam back into wound. Cont suction on at 12m. WOC will plan to change dressing again on Fri if patient is still in the hospital at that time. Pt is awaiting discharge disposition and placement.   Ostomy follow-up: Changed ostomy pouch. Stoma is red and viable, below skin level, 1 1/4 inches and oval. Applied barrier ring and one piece convex pouch.  Pt watched the procedure using a hand held mirror and asked appropriate questions.  She is able to open and close to empty independently.    5 sets of barrier rings (Kellie Simmering# [phone] and 5 convex pouches (Kellie Simmering# [phone] at the bedside for use or upon discharge. Reviewed pouching routines and ordering supplies.   DJulien GirtMSN, RN, CIndustry CAlden CAnt[place]

## 2022-07-01 NOTE — TOC Transition Note (Signed)
Transition of Care [facility]) - CM/SW Discharge Note   Patient Details  Name: Morgan Cline MRN: [id number] Date of Birth: 11-Jun-1971  Transition of Care (TOC) CM/SW Contact:  Joaquin Courts, RN Phone Number: 07/01/2022, 3:06 PM   Clinical Narrative:    Patient to discharge to room 73 at [facility], bedside RN please call report [phone].  [facility] transport arranged.  DC summary faxed to [place] [place] [facility].      Barriers to Discharge: No Home Care Agency will accept this patient   Patient Goals and CMS Choice Patient states their goals for this hospitalization and ongoing recovery are:: to go home CMS Medicare.gov Compare Post Acute Care list provided to:: Patient Choice offered to / list presented to : Patient  Discharge Placement                       Discharge Plan and Services   Discharge Planning Services: CM Consult Post Acute Care Choice: Durable Medical Equipment, Home Health          DME Arranged: Negative pressure wound device DME Agency: [facility] Date DME Agency Contacted: 06/15/22 Time DME Agency Contacted: 82 Representative spoke with at DME Agency: [place]: RN   Date [place]: 06/15/22      Social Determinants of Health ([place]) Interventions     Readmission Risk Interventions    06/01/2022   11:45 AM  Readmission Risk Prevention Plan  Post Dischage Appt Complete  Medication Screening Complete  Transportation Screening Complete

## 2022-07-01 NOTE — TOC Progression Note (Addendum)
Transition of Care [facility]) - Progression Note    Patient Details  Name: LATRELLE FUSTON MRN: [id number] Date of Birth: 1971-02-06  [facility] of [facility]) CM/SW Contact  Leeroy Cha, RN Phone Number: 07/01/2022, 1:00 PM  Clinical Narrative:    Audelia Hives with [facility]-will have tanya to call back and verify that facility is ready to accept patient. Tct-tanya at [facility] room is being cleans wcb when they are ready for the patient to come. TRANSPORT PACKET WITH NUMBER TO CALL [facility] AND REPORT LEFT AT THE UNIT CLERKS DESK. Expected Discharge Plan: [place] Barriers to Discharge: No [place] will accept this patient  Expected Discharge Plan and Services Expected Discharge Plan: [place]   Discharge Planning Services: CM Consult Post Acute Care Choice: Durable Medical Equipment, Home Health   Expected Discharge Date: 07/01/22               DME Arranged: Negative pressure wound device DME Agency: [facility] Date DME Agency Contacted: 06/15/22 Time DME Agency Contacted: 109 Representative spoke with at DME Agency: tracey [facility]arrow HH Arranged: RN   Date [place]: 06/15/22       Social Determinants of Health ([place]) Interventions    Readmission Risk Interventions    06/01/2022   11:45 AM  Readmission Risk Prevention Plan  Post Dischage Appt Complete  Medication Screening Complete  Transportation Screening Complete

## 2022-08-18 ENCOUNTER — Other Ambulatory Visit: Payer: Self-pay

## 2022-08-18 ENCOUNTER — Encounter: Payer: Self-pay | Admitting: Gastroenterology

## 2022-08-24 ENCOUNTER — Ambulatory Visit (HOSPITAL_COMMUNITY): Payer: Federal, State, Local not specified - PPO | Admitting: Nurse Practitioner

## 2022-09-15 ENCOUNTER — Ambulatory Visit (INDEPENDENT_AMBULATORY_CARE_PROVIDER_SITE_OTHER): Payer: Federal, State, Local not specified - PPO | Admitting: Gastroenterology

## 2022-09-15 ENCOUNTER — Encounter: Payer: Self-pay | Admitting: Gastroenterology

## 2022-09-15 VITALS — BP 110/86 | HR 90 | Ht 64.0 in | Wt 245.0 lb

## 2022-09-15 DIAGNOSIS — K5732 Diverticulitis of large intestine without perforation or abscess without bleeding: Secondary | ICD-10-CM | POA: Diagnosis not present

## 2022-09-15 MED ORDER — NA SULFATE-K SULFATE-MG SULF 17.5-3.13-1.6 GM/177ML PO SOLN: 1.0000 | Freq: Once | ORAL | 0 refills | Status: AC

## 2022-09-15 NOTE — Progress Notes (Unsigned)
HPI : Morgan Cline is a very pleasant 51 year old female with a history of arthritis, obesity and complicated sigmoid diverticulitis who is referred to [place] by Dr. Stark Klein for colonoscopy prior to ostomy takedown.  The patient had presented to the [facility] long emergency department June 23 of this year with abdominal pain.  She was diagnosed with sigmoid diverticulitis and discharged home with oral antibiotics (Augmentin), but her symptoms progressed and she came back to the emergency department 2 days later where repeat CT scan showed worsening of the diverticulitis with microperforation and leukocytosis.  She was admitted with IV antibiotics, but did not show clinical improvement and a third CT scan on June 29 showed findings consistent with frank perforation/perforated diverticulitis.  She underwent a Hartman's procedure June 30.  She experienced postop abscess and underwent percutaneous drainage on July 6.  These drains were removed July 19. She was in the hospital from June 25 to July 26, when she was discharged to a rehab facility.  She was discharged home from the rehab facility on August 28 and has been getting home health since then.  The patient had issues with surgical wound healing and had a wound VAC for a time.  She still has a midline abdominal wound that is followed by home health with bandage changes every other day.  Overall, the patient feels like she is doing well.  She lost 20 pounds during her hospitalization, but has gained 10 pounds back since.  She reports a good appetite, no problems with nausea or vomiting.  She has mild abdominal discomfort which is related to the healing abdominal wound and ostomy, but otherwise denies any symptoms similar to her diverticulitis pain.  No blood in the stool. She has never had a colonoscopy.  She has no known family history of colon cancer, but multiple family members have had diverticulitis.    Past Medical History:  Diagnosis Date    Arthritis    Obesity      Past Surgical History:  Procedure Laterality Date   CESAREAN SECTION     COLECTOMY N/A 06/05/2022   Procedure: TOTAL COLECTOMY;  Surgeon: Stark Klein, MD;  Location: [facility];  Service: General;  Laterality: N/A;   KNEE SURGERY     SHOULDER SURGERY     Family History  Problem Relation Age of Onset   Diabetes Maternal Grandmother    Diabetes Maternal Grandfather    Diabetes Paternal Grandmother    Diabetes Paternal Grandfather    Stomach cancer Neg Hx    Colon cancer Neg Hx    Esophageal cancer Neg Hx    Social History   Tobacco Use   Smoking status: Never   Smokeless tobacco: Never  Vaping Use   Vaping Use: Never used  Substance Use Topics   Alcohol use: Not Currently    Comment: Social   Drug use: No   Current Outpatient Medications  Medication Sig Dispense Refill   acetaminophen (TYLENOL) 500 MG tablet Take 2 tablets (1,000 mg total) by mouth every 6 (six) hours. 30 tablet 0   ALPRAZolam (XANAX) 0.5 MG tablet Take 1 tablet (0.5 mg total) by mouth 2 (two) times daily as needed for anxiety. 4 tablet 0   diphenhydramine-acetaminophen (TYLENOL PM) 25-500 MG TABS tablet Take 1 tablet by mouth at bedtime as needed (sleep).     Fiber Select Gummies CHEW Chew 3 tablets by mouth at bedtime.     hydrocortisone cream 0.5 % Apply topically 4 (four) times daily.  30 g 0   ibuprofen (ADVIL) 600 MG tablet Take 1 tablet (600 mg total) by mouth every 6 (six) hours as needed. (Patient taking differently: Take 600 mg by mouth every 6 (six) hours as needed (pain).) 30 tablet 0   levonorgestrel (MIRENA) 20 MCG/24HR IUD 1 each by Intrauterine route once. Implanted September 2020     methocarbamol (ROBAXIN) 500 MG tablet Take 1 tablet (500 mg total) by mouth every 6 (six) hours. 30 tablet 0   oxyCODONE (OXY IR/ROXICODONE) 5 MG immediate release tablet Take 1-2 tablets (5-10 mg total) by mouth every 6 (six) hours as needed for moderate pain, severe pain or breakthrough  pain. 4 tablet 0   metoprolol succinate (TOPROL-XL) 25 MG 24 hr tablet Take 0.5 tablets (12.5 mg total) by mouth daily. (Patient not taking: Reported on 09/15/2022)     ondansetron (ZOFRAN) 4 MG tablet Take 1 tablet (4 mg total) by mouth every 4 (four) hours as needed for nausea or vomiting. (Patient not taking: Reported on 09/15/2022) 5 tablet 0   Semaglutide, 1 MG/DOSE, (OZEMPIC, 1 MG/DOSE,) 4 MG/3ML SOPN Inject 1 mg into the skin every Sunday. (Patient not taking: Reported on 09/15/2022)     No current facility-administered medications for this visit.   Allergies  Allergen Reactions   Codeine Hives and Itching   Compazine [Prochlorperazine] Hives and Itching   Pineapple Itching and Swelling    Lip swelling and itching     Review of Systems: All systems reviewed and negative except where noted in HPI.    No results found.  Physical Exam: BP 110/86   Pulse 90   Ht '5\' 4"'$  (1.626 m)   Wt 245 lb (111.1 kg)   BMI 42.05 kg/m  Constitutional: Pleasant, obese, African American female in no acute distress. HEENT: Normocephalic and atraumatic. Conjunctivae are normal. No scleral icterus. Neck supple.  Cardiovascular: Normal rate, regular rhythm.  Pulmonary/chest: Effort normal and breath sounds normal. No wheezing, rales or rhonchi. Abdominal: Soft, nondistended, nontender. Ostomy in place in left hemiabdomen.  Midline abdominal wound dressed with adhesive bandage, not removed.  Bowel sounds active throughout. There are no masses palpable. No hepatomegaly. Extremities: no edema Neurological: Alert and oriented to person place and time. Skin: Skin is warm and dry. No rashes noted. Psychiatric: Normal mood and affect. Behavior is normal.  CBC    Component Value Date/Time   WBC 5.3 06/24/2022 0426   RBC 3.36 (L) 06/24/2022 0426   HGB 9.4 (L) 06/24/2022 0426   HCT 30.8 (L) 06/24/2022 0426   PLT 403 (H) 06/24/2022 0426   MCV 91.7 06/24/2022 0426   MCH 28.0 06/24/2022 0426   MCHC  30.5 06/24/2022 0426   RDW 15.0 06/24/2022 0426   LYMPHSABS 1.5 06/24/2022 0426   MONOABS 0.7 06/24/2022 0426   EOSABS 0.4 06/24/2022 0426   BASOSABS 0.0 06/24/2022 0426    CMP     Component Value Date/Time   NA 135 06/24/2022 0426   K 3.6 06/24/2022 0426   CL 102 06/24/2022 0426   CO2 23 06/24/2022 0426   GLUCOSE 95 06/24/2022 0426   BUN 16 06/24/2022 0426   CREATININE 0.73 07/01/2022 0416   CALCIUM 8.4 (L) 06/24/2022 0426   PROT 7.2 06/20/2022 1649   ALBUMIN 2.4 (L) 06/20/2022 1649   AST 22 06/20/2022 1649   ALT 24 06/20/2022 1649   ALKPHOS 127 (H) 06/20/2022 1649   BILITOT 0.4 06/20/2022 1649   GFRNONAA >60 07/01/2022 0416   GFRAA >90  11/16/2011 1455     ASSESSMENT AND PLAN: 51 year old female with complicated sigmoid diverticulitis, status post Hartman's procedure, being considered for ostomy takedown.  She needs a colonoscopy prior to the surgery.  No chronic GI symptoms and no family history of GI malignancy.  She is obese, but has no known cardiopulmonary comorbidities.  We will schedule her for a colonoscopy via ostomy as well as pouchoscopy in preparation for eventual ostomy takedown.  History of complicated diverticulitis status post Hartman's - Colonoscopy via ostomy with pouchoscopy prior to ostomy takedown  The details, risks (including bleeding, perforation, infection, missed lesions, medication reactions and possible hospitalization or surgery if complications occur), benefits, and alternatives to colonoscopy with possible biopsy and possible polypectomy were discussed with the patient and she consents to proceed.   Cervando Durnin E. Candis Schatz, MD [place], [facility]

## 2022-09-15 NOTE — Patient Instructions (Signed)
You have been scheduled for a colonoscopy. Please follow written instructions given to you at your visit today.  Please pick up your prep supplies at the pharmacy within the next 1-3 days. If you use inhalers (even only as needed), please bring them with you on the day of your procedure.  _______________________________________________________  If you are age 49 or older, your body mass index should be between 23-30. Your Body mass index is 42.05 kg/m. If this is out of the aforementioned range listed, please consider follow up with your Primary Care Provider.  If you are age 59 or [place], your body mass index should be between 19-25. Your Body mass index is 42.05 kg/m. If this is out of the aformentioned range listed, please consider follow up with your Primary Care Provider.   ________________________________________________________  The [facility] providers would like to encourage you to use [facility] to communicate with providers for non-urgent requests or questions.  Due to long hold times on the telephone, sending your provider a message by [name] may be a faster and more efficient way to get a response.  Please allow 48 business hours for a response.  Please remember that this is for non-urgent requests.  _______________________________________________________

## 2022-09-29 ENCOUNTER — Encounter: Payer: Federal, State, Local not specified - PPO | Admitting: Gastroenterology

## 2022-10-06 ENCOUNTER — Encounter: Payer: Self-pay | Admitting: Gastroenterology

## 2022-10-06 ENCOUNTER — Ambulatory Visit (AMBULATORY_SURGERY_CENTER): Payer: Federal, State, Local not specified - PPO | Admitting: Gastroenterology

## 2022-10-06 VITALS — BP 119/81 | HR 87 | Temp 97.1°F | Resp 16 | Ht 64.0 in | Wt 245.0 lb

## 2022-10-06 DIAGNOSIS — K5732 Diverticulitis of large intestine without perforation or abscess without bleeding: Secondary | ICD-10-CM

## 2022-10-06 MED ORDER — SODIUM CHLORIDE 0.9 % IV SOLN: 500.0000 mL | INTRAVENOUS | Status: DC

## 2022-10-06 NOTE — Patient Instructions (Signed)
Please read handouts provided. Continue present medications. Repeat lower GI endoscopy in 10 years for screening. Recommend high fiber diet or daily fiber supplementation. Await pathology results.  YOU HAD AN ENDOSCOPIC PROCEDURE TODAY AT [facility]:   Refer to the procedure report that was given to you for any specific questions about what was found during the examination.  If the procedure report does not answer your questions, please call your gastroenterologist to clarify.  If you requested that your care partner not be given the details of your procedure findings, then the procedure report has been included in a sealed envelope for you to review at your convenience later.  YOU SHOULD EXPECT: Some feelings of bloating in the abdomen. Passage of more gas than usual.  Walking can help get rid of the air that was put into your GI tract during the procedure and reduce the bloating. If you had a lower endoscopy (such as a colonoscopy or flexible sigmoidoscopy) you may notice spotting of blood in your stool or on the toilet paper. If you underwent a bowel prep for your procedure, you may not have a normal bowel movement for a few days.  Please Note:  You might notice some irritation and congestion in your nose or some drainage.  This is from the oxygen used during your procedure.  There is no need for concern and it should clear up in a day or so.  SYMPTOMS TO REPORT IMMEDIATELY:  Following lower endoscopy (colonoscopy or flexible sigmoidoscopy):  Excessive amounts of blood in the stool  Significant tenderness or worsening of abdominal pains  Swelling of the abdomen that is new, acute  Fever of 100F or higher.  For urgent or emergent issues, a gastroenterologist can be reached at any hour by calling [phone]. Do not use MyChart messaging for urgent concerns.    DIET:  We do recommend a small meal at first, but then you may proceed to your regular diet.  Drink plenty of  fluids but you should avoid alcoholic beverages for 24 hours.  ACTIVITY:  You should plan to take it easy for the rest of today and you should NOT DRIVE or use heavy machinery until tomorrow (because of the sedation medicines used during the test).    FOLLOW UP: Our staff will call the number listed on your records the next business day following your procedure.  We will call around 7:15- 8:00 am to check on you and address any questions or concerns that you may have regarding the information given to you following your procedure. If we do not reach you, we will leave a message.     If any biopsies were taken you will be contacted by phone or by letter within the next 1-3 weeks.  Please call us at [phone] if you have not heard about the biopsies in 3 weeks.    SIGNATURES/CONFIDENTIALITY: You and/or your care partner have signed paperwork which will be entered into your electronic medical record.  These signatures attest to the fact that that the information above on your After Visit Summary has been reviewed and is understood.  Full responsibility of the confidentiality of this discharge information lies with you and/or your care-partner.

## 2022-10-06 NOTE — Progress Notes (Signed)
Pt's states no medical or surgical changes since previsit or office visit. 

## 2022-10-06 NOTE — Progress Notes (Signed)
History and Physical Interval Note:  10/06/2022 9:34 AM  Morgan Cline  has presented today for endoscopic procedure(s), with the diagnosis of  Encounter Diagnosis  Name Primary?   Diverticulitis of colon Yes  .  The various methods of evaluation and treatment have been discussed with the patient and/or family. After consideration of risks, benefits and other options for treatment, the patient has consented to  the endoscopic procedure(s).   The patient's history has been reviewed, patient examined, no change in status, stable for endoscopic procedure(s).  I have reviewed the patient's chart and labs.  Questions were answered to the patient's satisfaction.     [name] E. Candis Schatz, MD [facility]

## 2022-10-06 NOTE — Progress Notes (Signed)
Report to PACU, RN, vss, BBS= Clear.  

## 2022-10-06 NOTE — Op Note (Addendum)
[facility] Patient Name: Morgan Cline Procedure Date: 10/06/2022 9:28 AM MRN: [id number] Endoscopist: Nicki Reaper E. Candis Schatz , MD, [id number] Age: 51 Referring MD:  Date of Birth: Apr 15, 1971 Gender: Female Account #: [id number] Procedure:                Colonoscopy via Stoma with Endoscopy of Hartmann                            Pouch Indications:              History of diverticulitis s/p diverting colostomy                            with Jeanette Caprice pouch Medicines:                Monitored Anesthesia Care Procedure:                Pre-Anesthesia Assessment:                           - Prior to the procedure, a History and Physical                            was performed, and patient medications and                            allergies were reviewed. The patient's tolerance of                            previous anesthesia was also reviewed. The risks                            and benefits of the procedure and the sedation                            options and risks were discussed with the patient.                            All questions were answered, and informed consent                            was obtained. Prior Anticoagulants: The patient has                            taken no anticoagulant or antiplatelet agents. ASA                            Grade Assessment: III - A patient with severe                            systemic disease. After reviewing the risks and                            benefits, the patient was deemed in satisfactory  condition to undergo the procedure.                           After obtaining informed consent, the endoscope was                            passed under direct vision. Throughout the                            procedure, the patient's blood pressure, pulse, and                            oxygen saturations were monitored continuously.The                            procedure was performed without  difficulty. The                            patient tolerated the procedure well. The quality                            of the bowel preparation was good. The 0405                            PCF-H190TL Slim SB Colonoscope was introduced                            through the anus and advanced to the [employer]. The 0405 PCF-H190TL Slim SB Colonoscope was                            introduced through the descending colostomy and                            advanced to the terminal ileum, with identification                            of the appendiceal orifice and IC valve. Scope In: 9:54:55 AM Scope Out: 10:01:51 AM Scope Withdrawal Time: 0 hours 6 minutes 7 seconds  Total Procedure Duration: 0 hours 6 minutes 56 seconds  Findings:                 The perianal and digital rectal examinations were                            normal. Pertinent negatives include normal                            sphincter tone and no palpable rectal lesions.                            Digital palpation of the ostomy revealed a  stenosis, unable to insert index finger fully                           A diffuse area of mildly nodular mucosa was found                            in the rectum and in the sigmoid colon (on                            retroflexion). Biopsies were taken with a cold                            forceps for histology. Estimated blood loss was                            minimal.                           A few small-mouthed diverticula were found in the                            sigmoid colon.                           Multiple medium-mouthed and small-mouthed                            diverticula were found in the descending colon,                            transverse colon and ascending colon.                           The exam was otherwise normal throughout the                            examined colon.                            The terminal ileum appeared normal. Complications:            No immediate complications. Estimated Blood Loss:     Estimated blood loss was minimal. Impression:               - Nodular mucosa in the rectum and in the sigmoid                            colon. Biopsied. Suspect this is related to                            diversion effect.                           - Diverticulosis in the sigmoid colon.                           -  Diverticulosis in the descending colon, in the                            transverse colon and in the ascending colon.                           - The examined portion of the ileum was normal. Recommendation:           - Discharge patient to home (with escort).                           - Patient has a contact number available for                            emergencies. The signs and symptoms of potential                            delayed complications were discussed with the                            patient. Return to normal activities tomorrow.                            Written discharge instructions were provided to the                            patient.                           - Resume previous diet.                           - Repeat colonoscopy in 10 years for screening                            purposes.                           - Follow up with surgeon for ostomy take down.                           - Recommend high fiber diet or daily fiber                            supplementation to reduce risk of diverticulitis in                            the future. Priscilla Kirstein E. Candis Schatz, MD 10/06/2022 10:22:13 AM This report has been signed electronically.

## 2022-10-06 NOTE — Progress Notes (Signed)
Called to room to assist during endoscopic procedure.  Patient ID and intended procedure confirmed with present staff. Received instructions for my participation in the procedure from the performing physician.  

## 2022-10-07 ENCOUNTER — Telehealth: Payer: Self-pay

## 2022-10-07 NOTE — Telephone Encounter (Signed)
  Follow up Call-     10/06/2022    9:13 AM  Call back number  Post procedure Call Back phone  # [phone]  Permission to leave phone message Yes    Follow up call made.  NALM

## 2022-10-11 NOTE — Progress Notes (Signed)
Ms. Landrus,  The biopsies from your colon were normal, showing only benign lymphoid aggregates.  No evidence of inflammation or precancerous changes were seen.   You do not need to do another colonoscopy for 10 years. Please follow up with your surgeon as planned to reverse your ostomy when it is time.

## 2022-10-21 ENCOUNTER — Encounter (HOSPITAL_COMMUNITY): Payer: Self-pay | Admitting: Nurse Practitioner

## 2022-11-11 IMAGING — CT CT ABD-PELV W/ CM
2 of 5 series · 16 of 46 positions shown, 18 images · IV contrast (APPLIED)
Comparison: None Available.

CLINICAL DATA: LLQ abdominal pain

EXAM:
CT ABDOMEN AND PELVIS WITH CONTRAST
TECHNIQUE: Multidetector CT imaging of the abdomen and pelvis was performed
using the standard protocol following bolus administration of
intravenous contrast.

[Series 2: abd pel w · axial · 0.95mm/px · z∈[+660,+1065]mm · 13 of 91 slices shown, 15 images]
[im 5/91  soft-tissue]
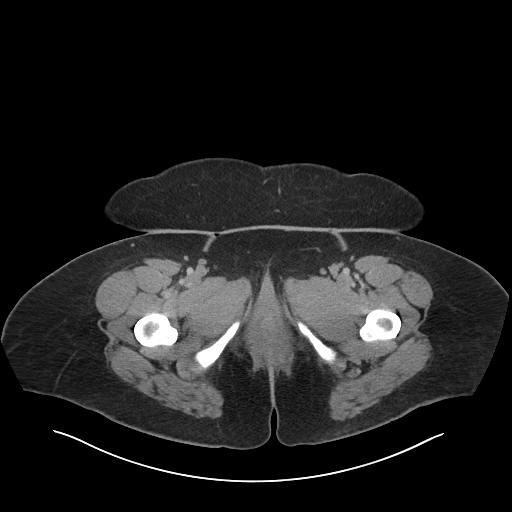
[im 5/91  bone]
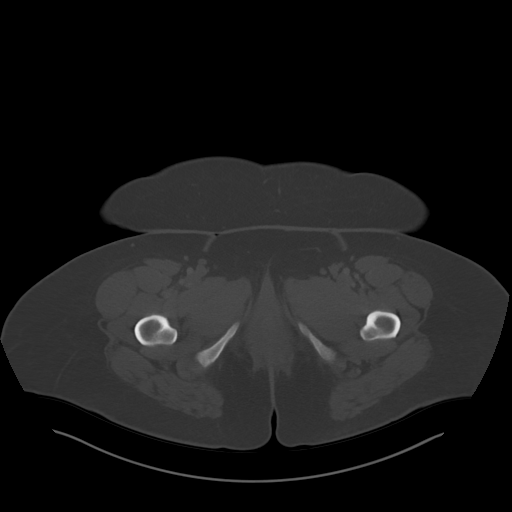
[im 14/91  soft-tissue]
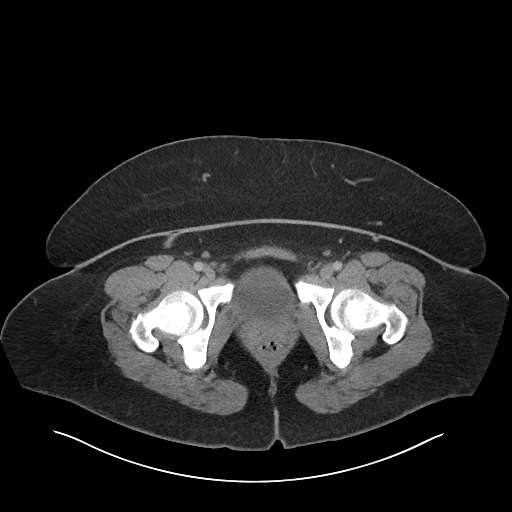
[im 19/91  soft-tissue]
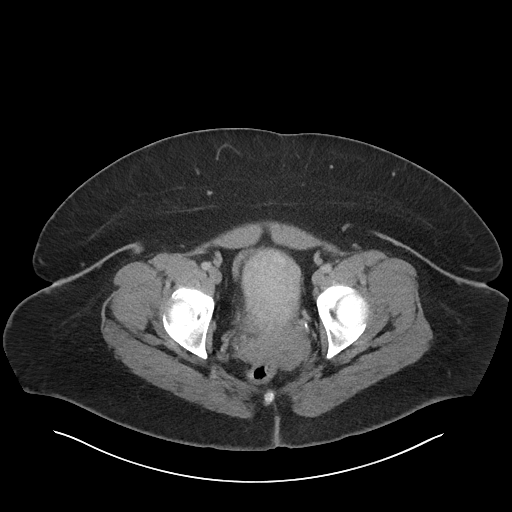
[im 28/91  soft-tissue]
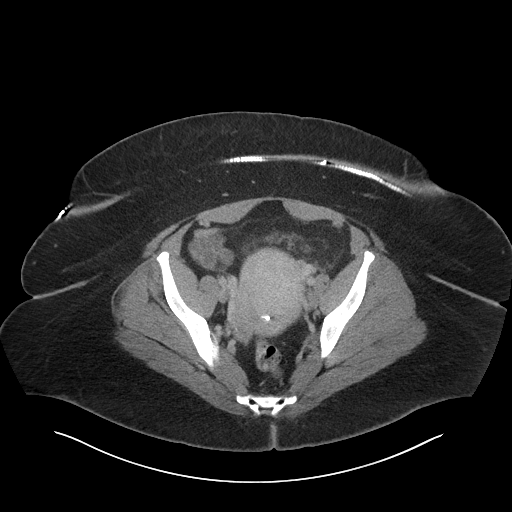
[im 32/91  soft-tissue]
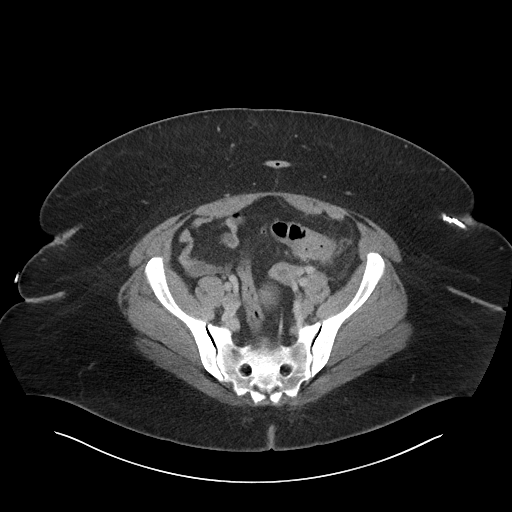
[im 41/91  soft-tissue]
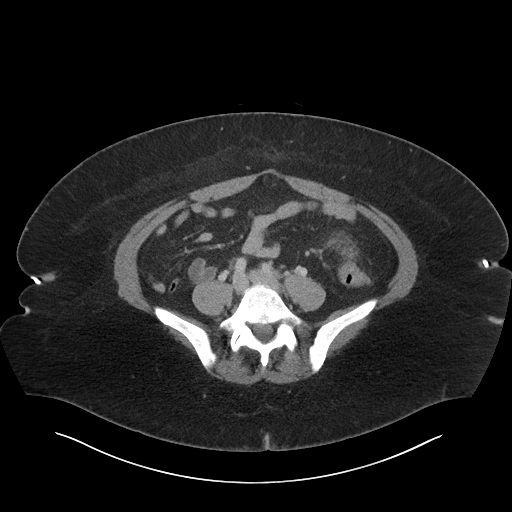
[im 46/91  soft-tissue]
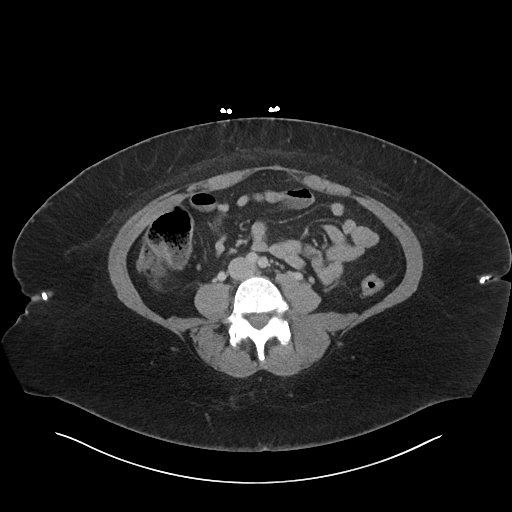
[im 50/91  soft-tissue]
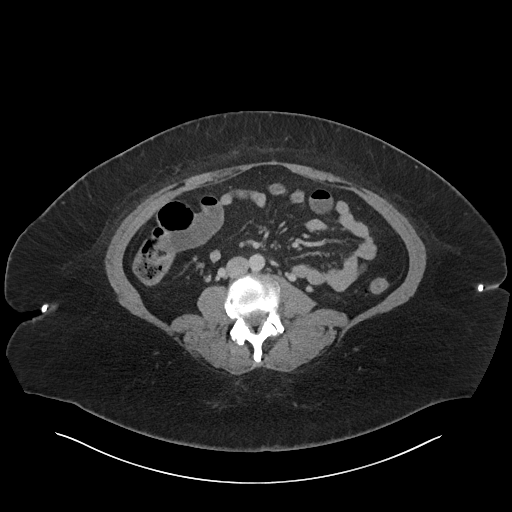
[im 59/91  soft-tissue]
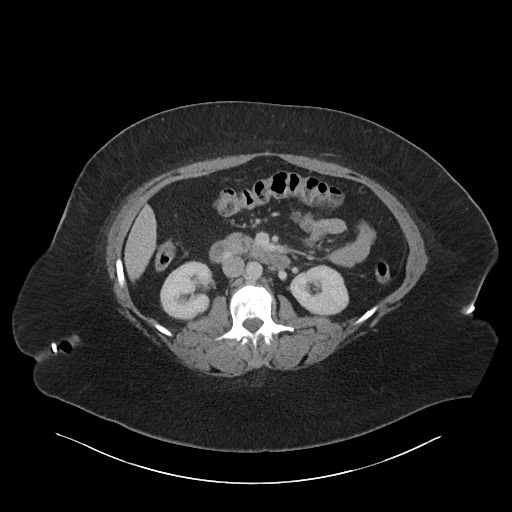
[im 59/91  bone]
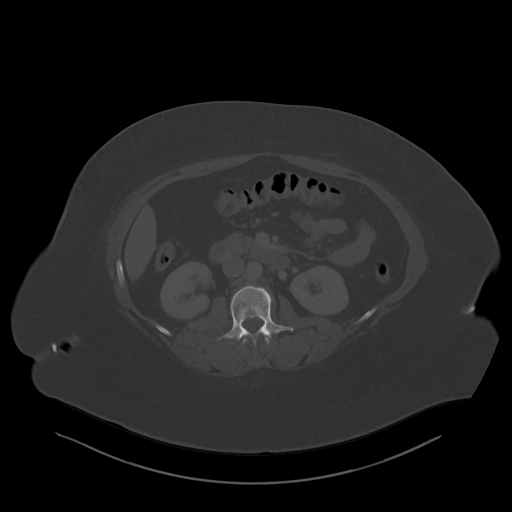
[im 64/91  soft-tissue]
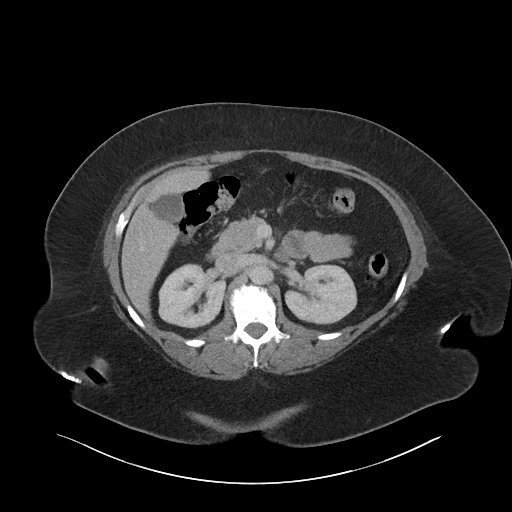
[im 73/91  soft-tissue]
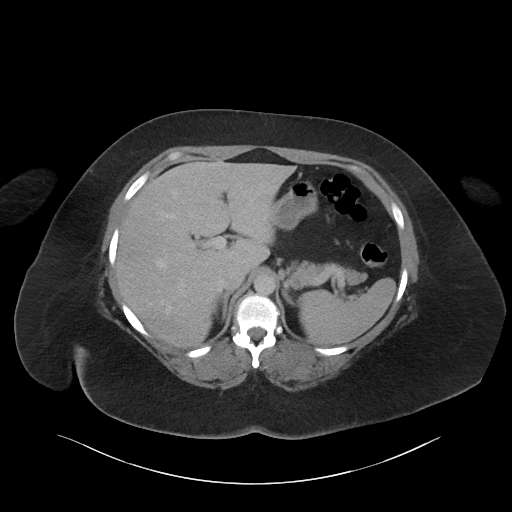
[im 77/91  soft-tissue]
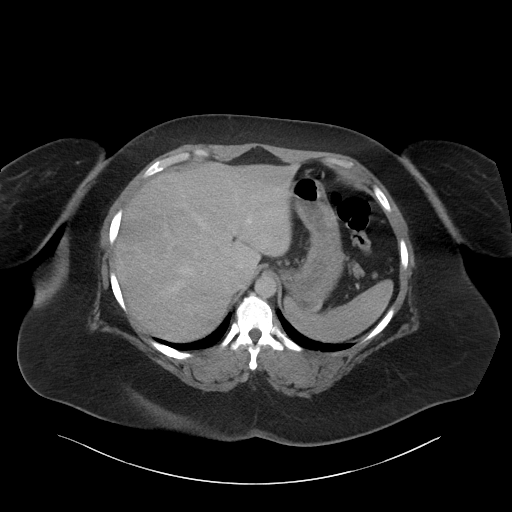
[im 86/91  soft-tissue]
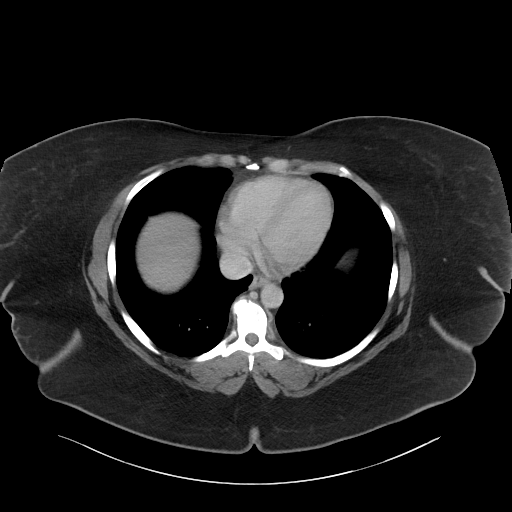

[Series 5: coronal · coronal · 0.97mm/px · 3 of 108 slices shown]
[im 36/108  soft-tissue]
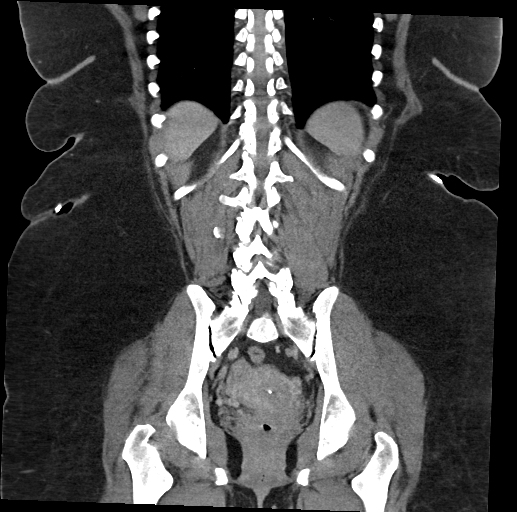
[im 48/108  soft-tissue]
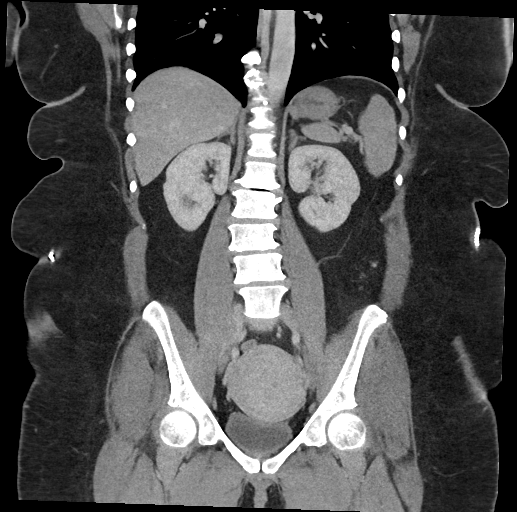
[im 60/108  soft-tissue]
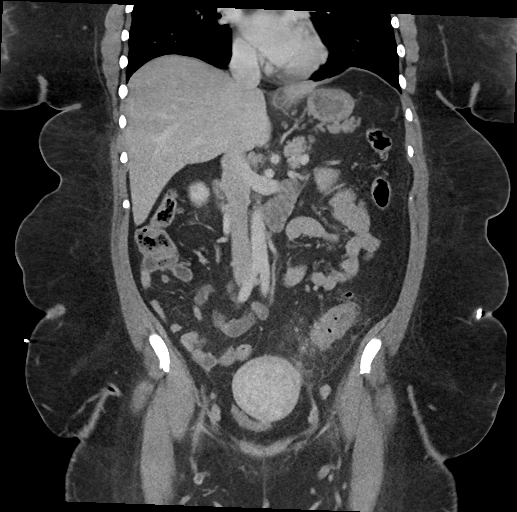

[16 of 46 positions shown; findings below may reference images not displayed]

RADIATION DOSE REDUCTION: This exam was performed according to the
departmental dose-optimization program which includes automated
exposure control, adjustment of the mA and/or kV according to
patient size and/or use of iterative reconstruction technique.

CONTRAST:  100mL OMNIPAQUE IOHEXOL 300 MG/ML  SOLN
FINDINGS: Lower chest: No acute abnormality.

Hepatobiliary: No focal liver abnormality is seen. No gallstones,
gallbladder wall thickening, or biliary dilatation.

Pancreas: Unremarkable. No pancreatic ductal dilatation or
surrounding inflammatory changes.

Spleen: Normal in size without focal abnormality.

Adrenals/Urinary Tract: Adrenal glands are unremarkable. Kidneys are
normal, without renal calculi, focal lesion, or hydronephrosis
except for couple of 2-3 mm hypodense areas at the upper pole of the
left kidney likely cysts. Bladder is unremarkable.

Stomach/Bowel: Stomach and small intestines have a normal
appearance. Moderate diverticulosis of the descending sigmoid colon.
There is approximately 8 cm segment of severe sigmoid colonic wall
thickening and pericolonic inflammatory stranding seen (images 64/5,
52 through 59 of series 2) consistent with diverticulitis. No fluid
or abscess. Appendix is normal.

Vascular/Lymphatic: No significant vascular findings are present. No
enlarged abdominal or pelvic lymph nodes.

Reproductive: There is a large intramural fibroid at the left
fundus/body of the uterus measuring approximally 8.4 x 5.5 by 5.5 cm
causing right and posterior displacement of the intrauterine device.

Other: Small umbilical hernia containing fat.

Musculoskeletal: No acute or significant osseous findings.
IMPRESSION: Approximately 8 cm segment of mid sigmoid colonic diverticulitis
with severe sigmoid colonic wall thickening and pericolonic
stranding as described above. No pericolonic fluid or abscess seen.

Large uterine fibroid at the left fundus/body of the uterus causing
right and posterior displacement of the IUD.

## 2022-11-19 ENCOUNTER — Encounter: Payer: Self-pay | Admitting: Obstetrics and Gynecology

## 2022-11-19 ENCOUNTER — Ambulatory Visit (INDEPENDENT_AMBULATORY_CARE_PROVIDER_SITE_OTHER): Payer: Federal, State, Local not specified - PPO | Admitting: Obstetrics and Gynecology

## 2022-11-19 VITALS — BP 121/82 | HR 81 | Ht 64.0 in | Wt 253.0 lb

## 2022-11-19 DIAGNOSIS — Z01419 Encounter for gynecological examination (general) (routine) without abnormal findings: Secondary | ICD-10-CM

## 2022-11-19 DIAGNOSIS — Z1231 Encounter for screening mammogram for malignant neoplasm of breast: Secondary | ICD-10-CM

## 2022-11-19 NOTE — Addendum Note (Signed)
Addended by: Gale Journey on: 11/19/2022 01:12 PM   Modules accepted: Orders

## 2022-11-19 NOTE — Progress Notes (Signed)
ANNUAL EXAM Patient name: Morgan Cline MRN [id number]  Date of birth: 16-Sep-1971 Chief Complaint:   Annual Exam  History of Present Illness:   Morgan Cline is a 51 y.o. G2P0201 with IUD in place (2018) being seen today for a routine annual exam.   Underwent Hartmann's procedure for perforated diverticulitis in June, then had post op abscess. Is just getting back to work. Having lower abdominal discomfort/pressure since starting work.  Reports hot flashes & night sweats as well as urinary leakage that are overall not too bothersome and she is not interested in intervention at this time. Attributes some of her urinary leakage to her surgery this summer and would like to see if it improves with time.   Upstream - 11/19/22 1302       Pregnancy Intention Screening   Does the patient want to become pregnant in the next year? No    Does the patient's partner want to become pregnant in the next year? N/A    Would the patient like to discuss contraceptive options today? No      Contraception Wrap Up   Current Method IUD or IUS    End Method IUD or IUS    Contraception Counseling Provided No    How was the end contraceptive method provided? N/A            The pregnancy intention screening data noted above was reviewed. Potential methods of contraception were discussed. The patient elected to proceed with IUD or IUS.   Last pap 02/26/20. Results were: NILM w/ HRHPV negative. H/O abnormal pap: yes at 18-19yo, none recently Last mammogram: 02/2021. Results were: normal. Family h/o breast cancer: no Last colonoscopy: 10/06/22. Results were: normal. Family h/o colorectal cancer: no      No data to display               No data to display         Review of Systems:   Pertinent items are noted in HPI Denies any headaches, blurred vision, fatigue, shortness of breath, chest pain, abnormal vaginal discharge/itching/odor/irritation, bowel movements, or intercourse unless  otherwise stated above. Pertinent History Reviewed:  Reviewed past medical,surgical, social and family history.  Reviewed problem list, medications and allergies. Physical Assessment:   Vitals:   11/19/22 1049  BP: 121/82  Pulse: 81  Weight: 253 lb (114.8 kg)  Height: '5\' 4"'$  (1.626 m)  Body mass index is 43.43 kg/m.        Physical Examination:   General appearance - well appearing, and in no distress  Mental status - alert, oriented to person, place, and time  Psych:  She has a normal mood and affect  Chest - effort normal  Heart - normal rate and regular rhythm  Breasts - breasts appear normal, no suspicious masses, no skin or nipple changes or  axillary nodes  Abdomen - soft, nontender. Left sided colostomy with stool & gas in bag. Large abdominal incision covered with ABD/dressing.   Pelvic - VULVA: normal appearing vulva with no masses, tenderness or lesions  CERVIX: normal texture, no CMT. IUD strings palpable  UTERUS: uterus nonpalpable due to habitus. No bladder tenderness.  ADNEXA: No adnexal masses or tenderness noted.  Chaperone present for exam Assessment & Plan:  1) Well-Woman Exam Satisfied with IUD, no changes desired at this time Next pap 2026 Mammogram: schedule screening mammo as soon as possible Colonoscopy: up to date (10/06/2022), next due 2033 per GI  Orders Placed  This Encounter  Procedures   MM Digital Screening   Follow-up: Return in about 1 year (around 11/20/2023) for annual exam.  Inez Catalina, MD 11/19/2022 1:09 PM

## 2022-11-25 ENCOUNTER — Ambulatory Visit (INDEPENDENT_AMBULATORY_CARE_PROVIDER_SITE_OTHER): Payer: Federal, State, Local not specified - PPO

## 2022-11-25 DIAGNOSIS — Z1231 Encounter for screening mammogram for malignant neoplasm of breast: Secondary | ICD-10-CM | POA: Diagnosis not present

## 2022-11-25 DIAGNOSIS — Z01419 Encounter for gynecological examination (general) (routine) without abnormal findings: Secondary | ICD-10-CM

## 2023-02-09 ENCOUNTER — Ambulatory Visit (HOSPITAL_COMMUNITY)
Admission: RE | Admit: 2023-02-09 | Discharge: 2023-02-09 | Disposition: A | Discharge status: Home / Self Care | Payer: No Typology Code available for payment source | Source: Ambulatory Visit | Attending: Nurse Practitioner | Admitting: Nurse Practitioner

## 2023-02-09 DIAGNOSIS — Z433 Encounter for attention to colostomy: Secondary | ICD-10-CM | POA: Diagnosis present

## 2023-02-09 DIAGNOSIS — K5720 Diverticulitis of large intestine with perforation and abscess without bleeding: Secondary | ICD-10-CM | POA: Insufficient documentation

## 2023-02-09 DIAGNOSIS — K9400 Colostomy complication, unspecified: Secondary | ICD-10-CM | POA: Diagnosis not present

## 2023-02-09 DIAGNOSIS — L24B3 Irritant contact dermatitis related to fecal or urinary stoma or fistula: Secondary | ICD-10-CM | POA: Insufficient documentation

## 2023-02-09 NOTE — Discharge Instructions (Signed)
Try coloplast pouch if redness does not improve Flatten barrier ring Add ostomy belt

## 2023-02-09 NOTE — Progress Notes (Signed)
[facility]   Reason for visit:  LUQ colostomy HPI:  Diverticulitis Past Medical History:  Diagnosis Date   Arthritis    Obesity    Family History  Problem Relation Age of Onset   Diabetes Maternal Grandmother    Diabetes Maternal Grandfather    Diabetes Paternal Grandmother    Diabetes Paternal Grandfather    Stomach cancer Neg Hx    Colon cancer Neg Hx    Esophageal cancer Neg Hx    Allergies  Allergen Reactions   Codeine Hives and Itching   Compazine [Prochlorperazine] Hives and Itching   Pineapple Itching, Swelling, Dermatitis and Hives    Lip swelling and itching  Of Lips with associated Lip Swelling  Lip swelling and itching   Current Outpatient Medications  Medication Sig Dispense Refill Last Dose   acetaminophen (TYLENOL) 500 MG tablet Take 2 tablets (1,000 mg total) by mouth every 6 (six) hours. 30 tablet 0    ALPRAZolam (XANAX) 0.5 MG tablet Take 1 tablet (0.5 mg total) by mouth 2 (two) times daily as needed for anxiety. 4 tablet 0    ascorbic acid (VITAMIN C) 500 MG tablet Take 1,000 mg by mouth daily.      diphenhydramine-acetaminophen (TYLENOL PM) 25-500 MG TABS tablet Take 1 tablet by mouth at bedtime as needed (sleep).      hydrocortisone cream 0.5 % Apply topically 4 (four) times daily. 30 g 0    levonorgestrel (MIRENA) 20 MCG/24HR IUD 1 each by Intrauterine route once. Implanted September 2020      methocarbamol (ROBAXIN) 500 MG tablet Take 1 tablet (500 mg total) by mouth every 6 (six) hours. 30 tablet 0    metoprolol succinate (TOPROL-XL) 25 MG 24 hr tablet Take 0.5 tablets (12.5 mg total) by mouth daily. (Patient not taking: Reported on 09/15/2022)      nystatin (MYCOSTATIN/NYSTOP) powder Apply topically 2 (two) times daily.      Semaglutide, 1 MG/DOSE, (OZEMPIC, 1 MG/DOSE,) 4 MG/3ML SOPN Inject 1 mg into the skin every Sunday. (Patient not taking: Reported on 09/15/2022)      VITAMIN D PO Take 1 tablet by mouth daily.      Zinc  Citrate-Phytase (ZYTAZE) 25-500 MG CAPS Take 1 capsule by mouth daily.      No current facility-administered medications for this encounter.   ROS  Review of Systems  Gastrointestinal:        LUQ colostomy  Skin:  Positive for rash and wound.       Peristomal breakdown Midline incision  Psychiatric/Behavioral: Negative.    All other systems reviewed and are negative.  Vital signs:  BP 124/81   Pulse 89   Temp 98.3 F (36.8 C) (Oral)   Resp 18   Wt 116.1 kg   SpO2 97%   BMI 43.94 kg/m  Exam:  Physical Exam Constitutional:      Appearance: She is obese.  Abdominal:     Palpations: Abdomen is soft.     Comments: Colostomy present MD wants patient to lose weight prior to reversal to attempt laparoscopic approach  Neurological:     Mental Status: She is alert.     Stoma type/location:  LUQ colostomy, recessed below level of skin Stomal assessment/size:  5/6" recessed Peristomal assessment:  denuded skin present circumferentially, stool leaks and sits on skin with current pouch. Erythema to perimeter of pouching area.   Treatment options for stomal/peristomal skin: 1 pc convex, 1 1/2" pouch with barrie ring and  barrier strips  Adding ostomy belt today Output: soft brwon stool Ostomy pouching: 1pc convex, sizing down Education provided:  discuss rationale for smaller pouch, flatten ring, stoma powder and skin prep to peristomal skin.  Barrier strips to perimeter.     Impression/dx  Contact dermatitis Colostomy complication Discussion  Add belt Given samples of soft convex coloplast pouch to try if erythema persists Plan  See back in 2 weeks    Visit time: 50 minutes.   Domenic Moras FNP-BC

## 2023-10-12 ENCOUNTER — Other Ambulatory Visit: Payer: Self-pay | Admitting: Obstetrics and Gynecology

## 2023-10-12 DIAGNOSIS — Z1231 Encounter for screening mammogram for malignant neoplasm of breast: Secondary | ICD-10-CM

## 2023-12-06 ENCOUNTER — Other Ambulatory Visit (HOSPITAL_COMMUNITY)
Admission: RE | Admit: 2023-12-06 | Discharge: 2023-12-06 | Disposition: A | Discharge status: Home / Self Care | Payer: Commercial Managed Care - PPO | Source: Ambulatory Visit | Attending: Obstetrics and Gynecology | Admitting: Obstetrics and Gynecology

## 2023-12-06 ENCOUNTER — Encounter: Payer: Self-pay | Admitting: Obstetrics and Gynecology

## 2023-12-06 ENCOUNTER — Ambulatory Visit: Payer: No Typology Code available for payment source

## 2023-12-06 ENCOUNTER — Ambulatory Visit (INDEPENDENT_AMBULATORY_CARE_PROVIDER_SITE_OTHER): Payer: Commercial Managed Care - PPO | Admitting: Obstetrics and Gynecology

## 2023-12-06 VITALS — BP 121/82 | HR 88 | Ht 64.0 in | Wt 280.0 lb

## 2023-12-06 DIAGNOSIS — Z01419 Encounter for gynecological examination (general) (routine) without abnormal findings: Secondary | ICD-10-CM | POA: Diagnosis present

## 2023-12-06 DIAGNOSIS — Z1231 Encounter for screening mammogram for malignant neoplasm of breast: Secondary | ICD-10-CM | POA: Diagnosis not present

## 2023-12-06 DIAGNOSIS — Z113 Encounter for screening for infections with a predominantly sexual mode of transmission: Secondary | ICD-10-CM | POA: Diagnosis present

## 2023-12-06 NOTE — Progress Notes (Signed)
   ANNUAL EXAM Patient name: Morgan Cline MRN [id number]  Date of birth: 1971-10-29 Chief Complaint:   Annual Exam  History of Present Illness:   Morgan Cline is a 52 y.o. G58P0201 female being seen today for a routine annual exam.   Current concerns: None. No period on IUD. Occasional hot flashes.   No LMP recorded. (Menstrual status: IUD).   Last MXR: 12/06/2023 Last Pap/Pap History:  02/26/20: pap/hpv wnl  Review of Systems:   Pertinent items are noted in HPI Denies any headaches, blurred vision, fatigue, shortness of breath, chest pain, abdominal pain, abnormal vaginal discharge/itching/odor/irritation, problems with periods, bowel movements, urination, or intercourse unless otherwise stated above.  Pertinent History Reviewed:  Reviewed past medical,surgical, social and family history.  Reviewed problem list, medications and allergies. Physical Assessment:   Vitals:   12/06/23 1536  BP: 121/82  Pulse: 88  Weight: 280 lb (127 kg)  Height: 5\' 4"  (1.626 m)  Body mass index is 48.06 kg/m.   Physical Examination:  General appearance - well appearing, and in no distress Mental status - alert, oriented to person, place, and time Psych:  She has a normal mood and affect Skin - warm and dry, normal color, no suspicious lesions noted Chest - effort normal Heart - normal rate  Breasts - breasts appear normal, no suspicious masses, no skin or nipple changes or axillary nodes Abdomen - soft, nontender, nondistended, no masses or organomegaly Pelvic -  VULVA: normal appearing vulva with no masses, tenderness or lesions  VAGINA: normal appearing vagina with normal color and discharge, no lesions  CERVIX: normal appearing cervix without discharge or lesions, no CMT UTERUS: uterus is felt to be normal size, shape, consistency and nontender  ADNEXA: No adnexal masses or tenderness noted. Extremities:  No swelling or varicosities noted  Chaperone present for exam  No results  found for this or any previous visit (from the past 24 hours).  Assessment & Plan:  Rafa was seen today for annual exam.  Diagnoses and all orders for this visit:  Encounter for annual routine gynecological examination - Cervical cancer screening: Discussed guidelines. Pap with HPV done - Breast Health: Encouraged self breast awareness/SBE. Discussed limits of clinical breast exam for detecting breast cancer. Discussed importance of annual MXR. MXR is up to date: 12/06/2023 - Climacteric/Sexual health: Reviewed typical and atypical symptoms of menopause/peri-menopause. Has IUD (placed 2020).  - Colonoscopy: up to date  - due 2033 - F/U 12 months and prn -     Cytology - PAP( [place])  Routine screening for STI (sexually transmitted infection) -     Cytology - PAP( [place])    No orders of the defined types were placed in this encounter.   Meds: No orders of the defined types were placed in this encounter.   Follow-up: Return in about 1 year (around 12/05/2024) for annual.  Milas Hock, MD 12/06/2023 3:59 PM

## 2023-12-09 LAB — CYTOLOGY - PAP
Chlamydia: NEGATIVE
Comment: NEGATIVE
Comment: NEGATIVE
Comment: NEGATIVE
Comment: NORMAL
Diagnosis: NEGATIVE
High risk HPV: NEGATIVE
Neisseria Gonorrhea: NEGATIVE
Trichomonas: NEGATIVE

## 2024-02-04 ENCOUNTER — Encounter (HOSPITAL_BASED_OUTPATIENT_CLINIC_OR_DEPARTMENT_OTHER): Payer: Self-pay | Admitting: Emergency Medicine

## 2024-02-04 ENCOUNTER — Emergency Department (HOSPITAL_BASED_OUTPATIENT_CLINIC_OR_DEPARTMENT_OTHER)
Admission: EM | Admit: 2024-02-04 | Discharge: 2024-02-04 | Discharge status: Against Medical Advice | Payer: Medicaid Other | Attending: Emergency Medicine | Admitting: Emergency Medicine

## 2024-02-04 ENCOUNTER — Other Ambulatory Visit: Payer: Self-pay

## 2024-02-04 DIAGNOSIS — Z5321 Procedure and treatment not carried out due to patient leaving prior to being seen by health care provider: Secondary | ICD-10-CM | POA: Diagnosis not present

## 2024-02-04 DIAGNOSIS — M5442 Lumbago with sciatica, left side: Secondary | ICD-10-CM | POA: Insufficient documentation

## 2024-02-04 NOTE — ED Triage Notes (Signed)
 C/o lower back pain that rads into left hip. Recently got a "pain shot" in hip but states made the pain worse. Denies new injuries. Prescribed prednisone, methocarbamol, and hydrocodone.

## 2024-02-05 ENCOUNTER — Other Ambulatory Visit: Payer: Self-pay

## 2024-02-05 ENCOUNTER — Emergency Department (HOSPITAL_BASED_OUTPATIENT_CLINIC_OR_DEPARTMENT_OTHER)
Admission: EM | Admit: 2024-02-05 | Discharge: 2024-02-05 | Disposition: A | Discharge status: Home / Self Care | Attending: Emergency Medicine | Admitting: Emergency Medicine

## 2024-02-05 ENCOUNTER — Encounter (HOSPITAL_BASED_OUTPATIENT_CLINIC_OR_DEPARTMENT_OTHER): Payer: Self-pay

## 2024-02-05 DIAGNOSIS — M545 Low back pain, unspecified: Secondary | ICD-10-CM | POA: Diagnosis present

## 2024-02-05 DIAGNOSIS — M5442 Lumbago with sciatica, left side: Secondary | ICD-10-CM | POA: Insufficient documentation

## 2024-02-05 MED ORDER — LIDOCAINE 5 % EX PTCH
1.0000 | MEDICATED_PATCH | CUTANEOUS | Status: DC
  Administered 2024-02-05: 1 via TRANSDERMAL
  Filled 2024-02-05: qty 1

## 2024-02-05 MED ORDER — LIDOCAINE 5 % EX PTCH
1.0000 | MEDICATED_PATCH | CUTANEOUS | 0 refills | Status: DC
Start: 2024-02-05 — End: 2024-10-17

## 2024-02-05 MED ORDER — LIDOCAINE 5 % EX PTCH: 1.0000 | MEDICATED_PATCH | CUTANEOUS | 0 refills | Status: DC

## 2024-02-05 MED ORDER — KETOROLAC TROMETHAMINE 15 MG/ML IJ SOLN
15.0000 mg | Freq: Once | INTRAMUSCULAR | Status: AC
  Administered 2024-02-05: 15 mg via INTRAMUSCULAR
  Filled 2024-02-05: qty 1

## 2024-02-05 NOTE — ED Triage Notes (Signed)
 Pt arrived from home via POV c/o lower back pain 10/10 with shooting pain down left leg. Pt states that she went to the dr Monday and yesterday and the ER also yesterday. Pt states that she thinks that the shot they gave her for the back pain hit a nerve because since that shot her left leg has hurt.

## 2024-02-05 NOTE — Discharge Instructions (Addendum)
 Take 1000 mg of Tylenol every 6 hours. Take 600 mg of ibuprofen every 8 hours. Alternate ice and heat for pain control at home. Use lidocaine patch directly over area of pain these are good for 24 hours. You can continue to try using Robaxin.  If you have breakthrough pain take the Norco prescribed. Follow-up with your primary care doctor or specialist in regards to kidney back pain and further workup.

## 2024-02-05 NOTE — ED Provider Notes (Signed)
  EMERGENCY DEPARTMENT AT [facility] Provider Note   CSN: [id number] Arrival date & time: 02/05/24  4098     History  Chief Complaint  Patient presents with   Back Pain    Morgan Cline is a 53 y.o. female with past medical history of arthritis, obesity and left lower back pain associated with left-sided sciatica presenting to emergency room with approximately 2 weeks of low back pain. Pain is worse with movement and with walking long distances. Patient reports that for the past week she has been going to primary care in the emergency room every day for pain control.  Patient reports that she did not have any particular injury or fall but thinks she overworked herself which started this flareup.  She had x-ray with primary care without any new findings.  She has been able to ambulate but with significant pain.  Denies any leg weakness.  Denies any saddle anesthesia.  Denies any change in bowel or bladder.  She has no history of cancer no history of IV drug use.   Back Pain      Home Medications Prior to Admission medications   Medication Sig Start Date End Date Taking? Authorizing Provider  acetaminophen (TYLENOL) 500 MG tablet Take 2 tablets (1,000 mg total) by mouth every 6 (six) hours. 06/17/22   Alwyn Ren, MD  ALPRAZolam Prudy Feeler) 0.5 MG tablet Take 1 tablet (0.5 mg total) by mouth 2 (two) times daily as needed for anxiety. 07/01/22   Joseph Art, DO  ascorbic acid (VITAMIN C) 500 MG tablet Take 1,000 mg by mouth daily. 08/05/22   [provider]  diphenhydramine-acetaminophen (TYLENOL PM) 25-500 MG TABS tablet Take 1 tablet by mouth at bedtime as needed (sleep).    [provider]  hydrocortisone cream 0.5 % Apply topically 4 (four) times daily. 06/17/22   Alwyn Ren, MD  levonorgestrel (MIRENA) 20 MCG/24HR IUD 1 each by Intrauterine route once. Implanted September 2020    [provider]  methocarbamol (ROBAXIN)  500 MG tablet Take 1 tablet (500 mg total) by mouth every 6 (six) hours. 06/17/22   Alwyn Ren, MD  metoprolol succinate (TOPROL-XL) 25 MG 24 hr tablet Take 0.5 tablets (12.5 mg total) by mouth daily. 07/02/22   Joseph Art, DO  nystatin (MYCOSTATIN/NYSTOP) powder Apply topically 2 (two) times daily. 08/24/22   [provider]  VITAMIN D PO Take 1 tablet by mouth daily.    [provider]  Zinc Citrate-Phytase (ZYTAZE) 25-500 MG CAPS Take 1 capsule by mouth daily. 09/07/22   [provider]      Allergies    Codeine, Compazine [prochlorperazine], and Pineapple    Review of Systems   Review of Systems  Musculoskeletal:  Positive for back pain.    Physical Exam Updated Vital Signs BP (!) 149/91 (BP Location: Right Arm)   Pulse 88   Temp 98.1 F (36.7 C) (Oral)   Resp 18   Ht 5\' 4"  (1.626 m)   Wt 124.7 kg   LMP 12/08/2007 (Approximate)   SpO2 100%   BMI 47.20 kg/m  Physical Exam Vitals and nursing note reviewed.  Constitutional:      General: She is not in acute distress.    Appearance: She is not toxic-appearing.  HENT:     Head: Normocephalic and atraumatic.  Eyes:     General: No scleral icterus.    Conjunctiva/sclera: Conjunctivae normal.  Cardiovascular:  Rate and Rhythm: Normal rate and regular rhythm.     Pulses: Normal pulses.     Heart sounds: Normal heart sounds.  Pulmonary:     Effort: Pulmonary effort is normal. No respiratory distress.     Breath sounds: Normal breath sounds.  Abdominal:     General: Abdomen is flat. Bowel sounds are normal.     Palpations: Abdomen is soft.     Tenderness: There is no abdominal tenderness.  Musculoskeletal:        General: Tenderness present.     Comments: Left low back tenderness over paraspinal musculature  Strength   Skin:    General: Skin is warm and dry.     Findings: No lesion.  Neurological:     General: No focal deficit present.     Mental Status: She is alert and  oriented to person, place, and time. Mental status is at baseline.     ED Results / Procedures / Treatments   Labs (all labs ordered are listed, but only abnormal results are displayed) Labs Reviewed - No data to display  EKG None  Radiology No results found.  Procedures Procedures    Medications Ordered in ED Medications - No data to display  ED Course/ Medical Decision Making/ A&P                                 Medical Decision Making Risk Prescription drug management.   Morgan Cline 53 y.o. presented today for back pain. Working Ddx: MSK in nature, fracture, epidural hematoma/abscess, cauda equina syndrome, spinal stenosis, spinal malignancy, discitis, spinal infection, spondylitises/ spondylosis, conus medullaris, DDD of the back.  R/o DDx: Cauda equina syndrome and additional dx are less likely than current impression due to history of present illness, physical exam, labs/imaging findings. No focal neurological deficits, no loss of bowel or bladder control.  Denies fever, night sweats, weight loss, h/o cancer, IVDU.  PMHX: obesity     Review: Reviewed most recent visit with 02/03/2024 as well as 02/02/2024 for low back pain   Problem List / ED Course / Critical interventions / Medication management  Patient reporting with 2 weeks of low back pain.  She has had frequent ER visits for the same thing.  She has primarily been trying prednisone for pain control.  This medicine is on last day of dosing.  She reports she actually thinks her back pain is starting to get better.  It felt better after massage however she wanted to make sure she could go to work and optimize medicine while she is waiting to follow-up with specialist team. She has not regularly been taking Tylenol nor ibuprofen.  She has not consistently been trying heat.  Discussed using Tylenol ibuprofen heat and ice regularly and only using Norco for breakthrough pain.  Will add lidocaine patch to her regimen.   No red flag symptoms of back pain thus do not feel further imaging or workup is necessary in emergency room today.  She has appropriate follow-up for this.  She has already had x-ray without any acute abnormality.  She is stable for discharge. I ordered medication including Lidoderm, Toradol Reevaluation of the patient after these medicines showed that the patient improved Patients vitals assessed. Upon arrival patient is hemodynamically stable.  I have reviewed the patients home medicines and have made adjustments as needed   Plan:  F/u w/ PCP in 2-3d to ensure resolution  of sx.  RICE protocol and pain medicine discussed with patient.  Patient was given return precautions. Patient stable for discharge at this time.  Patient educated on sx/dx and verbalized understanding of plan. Return to ER with new or worsening sx.          Final Clinical Impression(s) / ED Diagnoses Final diagnoses:  Acute left-sided low back pain with left-sided sciatica    Rx / DC Orders ED Discharge Orders          Ordered    lidocaine (LIDODERM) 5 %  Every 24 hours,   Status:  Discontinued        02/05/24 0927    lidocaine (LIDODERM) 5 %  Every 24 hours        02/05/24 0930              Ioma Chismar, Horald Chestnut, PA-C 02/05/24 1028    Pricilla Loveless, MD 02/05/24 1045

## 2024-05-04 ENCOUNTER — Other Ambulatory Visit (HOSPITAL_COMMUNITY): Payer: Self-pay | Admitting: Surgery

## 2024-05-04 DIAGNOSIS — Z933 Colostomy status: Secondary | ICD-10-CM

## 2024-05-25 ENCOUNTER — Ambulatory Visit (HOSPITAL_COMMUNITY)
Admission: RE | Admit: 2024-05-25 | Discharge: 2024-05-25 | Disposition: A | Discharge status: Home / Self Care | Source: Ambulatory Visit | Attending: Surgery | Admitting: Surgery

## 2024-05-25 DIAGNOSIS — Z933 Colostomy status: Secondary | ICD-10-CM | POA: Insufficient documentation

## 2024-05-25 MED ORDER — IOHEXOL 300 MG/ML  SOLN
150.0000 mL | Freq: Once | INTRAMUSCULAR | Status: DC | PRN
  Administered 2024-05-25: 450 mL

## 2024-06-14 NOTE — Progress Notes (Signed)
 PATIENT: Morgan Cline MRN:  [id number] DOB:  1971-12-03 DATE OF SERVICE:  06/14/2024  Referring Physician:  Glendia Rigger, FNP Primary Physician:  Rigger Glendia, FNP  Chief Complaint:  Chief Complaint  Patient presents with  . Right Knee - Follow-up    SUBJECTIVE:   Morgan Cline is a 53 y.o. female   Presents today for evaluation of her right knee pain.  History of Present Illness 08/23/2023  [id number] - left TKA elsewhere   Right knee: Symptoms have been present for about a year.  She does not recall a specific injury that started it. She was in hospital for months with diverticulitis. She became septic, had a colostomy.  She is expecting to have her colostomy reversed at some point.  As she tried to rehab after her hospital stay she found the right knee hurt.  Was on ozempic prior to diverticulitis.  Her pain is localized to behind the knee and does not radiate.    Symptoms are aggravated by movement and made better with rest.    Mechanical symptoms including locking, popping & giving way are not present.  Associated swelling is present.  She rates the pain as 4/10, and is debilitating.    So far, she has tried: NSAIDS YES, acetaminophen  YES, physical therapy YES, amongst others.    The patient has been referred for further evaluation and treatment recommendations.       Update 06/14/2024 She has previously had a right knee injection which was very helpful until recently.  No the pain is back.  She is also struggling with some other health issues including bad back.  She admits that she is not making progress today with her weight.  She understands that this is a problem.  She would like to lose weight but she is not sure how.  Lab Results  Component Value Date   Hemoglobin A1c 6.3 (H) 11/12/2023    Lab Results  Component Value Date   Creatinine 0.92 11/12/2023      No notes on file Past Medical History:  Diagnosis Date  . Allergy 1996   Noted in  my chart  . Anemia 2/13  . Anxiety June 2023  . Diverticulitis 06/05/2022  . Hemorrhoids 2002   May have occasional flareups  . Migraines 2000  . Osteoarthritis 2016   Knee replacement 2021   Past Surgical History:  Procedure Laterality Date  . Cesarean section  1995  . Knee arthroscopy Left 2020  . Shoulder arthroscopy  1996  . Sigmoidectomy  06/05/2022  . Small intestine surgery  June 26 , 2023   Codeine, Pineapple, and Prochlorperazine  Family History  Problem Relation Age of Onset  . Mental illness Mother   . Depression Mother        deceased  . Depression Father   . Diabetes Maternal Grandmother   . Diabetes Paternal Grandmother   . Arthritis Paternal Grandmother    Social History   Social History Narrative  . Not on file    Review of Systems: 14 point comprehensive ROS is reviewed and otherwise non-contributory.  OBJECTIVE:   VITALS:  Resp 20   Ht 5' 2.75 (1.594 m)   Wt 282 lb (127.9 kg)   LMP  (LMP Unknown)   BMI 50.35 kg/m   PHYSICAL EXAMINATION: General Exam: patient appears well nourished, and to be of stated age. BMI (Calculated): 50.3  Neuro: alert and oriented, answers questions appropriately. Psychiatric: Normal mood and affect, in no acute distress. Eyes: Pupils symmetric,  no scleral icterus. Head: normocephalic, without trauma. Skin: warm, dry, intact, with no cutaneous lesions, rashes, or ecchymoses. Pulse: pedal pulses easily palpable on side in question. Resp: even an unlabored.  Walks with an antalgic gait.  Left Knee Exam:  Effusion:not significant Skin: no scars Alignment: mild varus Tender to palpation: Medial joint line ROM: full range of motion  Distal neurologic examination reveals intact sensation, power, and reflexes as well as normal tone, L5-S1.  Dorsalis pedis pulse palpable and regular.  IMAGING: 4 views of the right knee demonstrate severe right knee osteoarthritis, bone-on-bone in the medial compartment with  lateral instability and widening of the joint space.   Impression: 1.  severe right knee varus DJD. 2.  Varus instability    ASSESSMENT:   1. Primary osteoarthritis of right knee  Large joint injection/arthrocentesis: R knee    2. Morbid obesity with BMI of 50.0-59.9, adult (*)          PLAN:  We had a long discussion about obesity. Patient understands obesity contributes to this problem, and certainly exacerbates the pain. Furthermore, recent research is demonstrated in the setting of morbid obesity, BMI greater than 40, risk of periprosthetic joint infection is dramatically elevated. This is compounded if diabetes as a comorbidity. The prostatic joint infection can be limb threatening problem in the setting of morbid obesity. For these reasons total joint arthroplasty is contraindicated at this point. We discussed weight loss and its importance. It may be reasonable at this point to consider medical and/or surgical help with weight loss.  She is already working with the bariatric team.  She is not interested or intending to have bariatric surgery but she is working on nutrition and medical help.    Nonoperative management of osteoarthritis includes weight loss or maintenance of healthy body weight, and low impact exercise avoiding of deep knee bending, squats and lunges.  Tylenol  and careful use of NSAIDs can be very helpful.  In addition, some people experience relief of symptoms with cortisone or viscosupplementation injections.   Large joint injection/arthrocentesis: R knee Date/Time: 06/14/2024 3:32 PM Consent given by: patient Site marked: site marked Timeout: Immediately prior to procedure a time out was called to verify the correct patient, procedure, equipment, support staff and site/side marked as required  Supporting Documentation Indications: pain  Procedure Details Location: knee - R knee Preparation: Patient was prepped and draped in the usual sterile fashion Needle  size: 22 G Medications administered: 40 mg triamcinolone acetonide 40 mg/mL (4mL 0.25% marcaine ) Patient tolerance: patient tolerated the procedure well with no immediate complications       Orders Placed This Encounter  Procedures  . Large joint injection/arthrocentesis: R knee     Patient's Medications       * Accurate as of June 14, 2024  3:33 PM. Reflects encounter med changes as of last refresh          Continued Medications      Instructions  acetaminophen  500 mg tablet Commonly known as: TYLENOL   1,000 mg, Every 6 hours scheduled   ALPRAZolam  0.25 mg tablet Commonly known as: XANAX   0.25 mg, Oral, 2 times a day as needed   Ashwagandha 125 MG Caps  125 mg, Every other day   calcium citrate-Vitamin D 315-5 mg-mcg per tablet Commonly known as: CITRACAL+D  1 tablet, 2 times a day   diphenhydraMINE -acetaminophen  25-500 MG Tabs Commonly known as: TYLENOL  PM  1 tablet   furosemide 40 mg tablet Commonly known as: LASIX  40 mg, Oral, Daily   gabapentin 300 mg capsule Commonly known as: NEURONTIN  Take 1 cap PO QHS x 3 days, then take 1 cap PO BID x 3 days, then take 1 cap PO TID   HYDROcodone -acetaminophen  5-325 mg per tablet Commonly known as: NORCO  1 tablet, Oral, Every 4 hours as needed   hydrocortisone  0.5 % cream Commonly known as: CORTAID  4 times daily   ibuprofen  600 mg tablet Commonly known as: ADVIL ,MOTRIN   600 mg, Every 6 hours as needed   levonorgestrel  IUD Commonly known as: MIRENA   1 each, Once   lidocaine  5% Commonly known as: LIDODERM   1 patch, Every 24 hours scheduled   meloxicam  15 mg tablet Commonly known as: MOBIC   15 mg, Oral, Daily   mupirocin 2 % ointment Commonly known as: BACTROBAN  Apply to affected area 2 times daily   nystatin powder Commonly known as: MYCOSTATIN  1 Application, Topical, 2 times a day   phentermine 37.5 MG tablet Commonly known as: ADIPEX-P  37.5 mg, Oral, 30 minutes before breakfast    topiramate 25 MG tablet Commonly known as: TOPAMAX  75 mg, Oral, Every evening   vitamin C 500 mg tablet Commonly known as: ASCORBIC ACID  1,000 mg, Daily   ZYTAZE 25-500 MG Caps Generic drug: Zinc Citrate-Phytase  1 capsule, Daily        The risks/benefits of the treatment plan were reviewed.  All questions were answered and the patient expressed understanding and agreement with the above-outlined plan.  Follow-up as discussed, or sooner if any worsening of symptoms or change in condition.  This note was transcribed with voice recognition software.  Please excuse transcription errors.  Author:  Kiki Bryan JINNY Dorisann, MD 06/14/2024 3:33 PM  CC: Chuckie Hamilton, FNP   *Some images could not be shown.

## 2024-07-31 ENCOUNTER — Other Ambulatory Visit (HOSPITAL_COMMUNITY): Payer: Self-pay | Admitting: Nurse Practitioner

## 2024-07-31 ENCOUNTER — Ambulatory Visit (HOSPITAL_COMMUNITY)
Admission: RE | Admit: 2024-07-31 | Discharge: 2024-07-31 | Disposition: A | Discharge status: Home / Self Care | Source: Ambulatory Visit | Attending: Plastic Surgery | Admitting: Plastic Surgery

## 2024-07-31 DIAGNOSIS — L24B3 Irritant contact dermatitis related to fecal or urinary stoma or fistula: Secondary | ICD-10-CM

## 2024-07-31 DIAGNOSIS — Z433 Encounter for attention to colostomy: Secondary | ICD-10-CM | POA: Diagnosis present

## 2024-07-31 DIAGNOSIS — K9403 Colostomy malfunction: Secondary | ICD-10-CM | POA: Insufficient documentation

## 2024-07-31 DIAGNOSIS — K9400 Colostomy complication, unspecified: Secondary | ICD-10-CM

## 2024-07-31 DIAGNOSIS — E669 Obesity, unspecified: Secondary | ICD-10-CM | POA: Insufficient documentation

## 2024-07-31 NOTE — Discharge Instructions (Signed)
 Morgan Cline - will update as needed REmove old pouch  Wash with warm water and pat dry.  Apply Flonase to irritated skin as needed  allow to air dry for a few seconds Apply stoma powder and skin prep to irritated skin We applied large Barrier seal instead of barrier ring Apply pouch and barrier strips.

## 2024-07-31 NOTE — Progress Notes (Signed)
 [facility]   Reason for visit:  LLQ recessed colostomy with frequent leaks and peristomal irritation HPI:  Diverticulitis with perforation, end colostomy, recessed Past Medical History:  Diagnosis Date   Arthritis    Obesity    Family History  Problem Relation Age of Onset   Diabetes Maternal Grandmother    Diabetes Maternal Grandfather    Diabetes Paternal Grandmother    Diabetes Paternal Grandfather    Stomach cancer Neg Hx    Colon cancer Neg Hx    Esophageal cancer Neg Hx    Allergies  Allergen Reactions   Codeine Hives and Itching   Compazine  [Prochlorperazine ] Hives and Itching   Pineapple Dermatitis, Hives, Itching and Swelling    Lip swelling and itching  Of Lips with associated Lip Swelling  Lip swelling and itching  Of Lips with associated Lip Swelling  Of Lips with associated Lip Swelling    Lip swelling and itching    Lip swelling and itching  Of Lips with associated Lip Swelling Lip swelling and itching   Current Outpatient Medications  Medication Sig Dispense Refill Last Dose/Taking   acetaminophen  (TYLENOL ) 500 MG tablet Take 2 tablets (1,000 mg total) by mouth every 6 (six) hours. 30 tablet 0    ALPRAZolam  (XANAX ) 0.5 MG tablet Take 1 tablet (0.5 mg total) by mouth 2 (two) times daily as needed for anxiety. 4 tablet 0    ascorbic acid (VITAMIN C) 500 MG tablet Take 1,000 mg by mouth daily.      diphenhydramine -acetaminophen  (TYLENOL  PM) 25-500 MG TABS tablet Take 1 tablet by mouth at bedtime as needed (sleep).      hydrocortisone  cream 0.5 % Apply topically 4 (four) times daily. 30 g 0    levonorgestrel  (MIRENA ) 20 MCG/24HR IUD 1 each by Intrauterine route once. Implanted September 2020      lidocaine  (LIDODERM ) 5 % Place 1 patch onto the skin daily. Remove & Discard patch within 12 hours or as directed by MD 30 patch 0    methocarbamol  (ROBAXIN ) 500 MG tablet Take 1 tablet (500 mg total) by mouth every 6 (six) hours. 30 tablet 0     metoprolol  succinate (TOPROL -XL) 25 MG 24 hr tablet Take 0.5 tablets (12.5 mg total) by mouth daily.      nystatin (MYCOSTATIN/NYSTOP) powder Apply topically 2 (two) times daily.      VITAMIN D PO Take 1 tablet by mouth daily.      Zinc Citrate-Phytase (ZYTAZE) 25-500 MG CAPS Take 1 capsule by mouth daily.      No current facility-administered medications for this encounter.   ROS  Review of Systems  Constitutional: Negative.   HENT: Negative.    Respiratory: Negative.    Cardiovascular: Negative.   Gastrointestinal:        LLQ colostomy  Endocrine:       Obesity  Musculoskeletal:  Positive for back pain.  Skin:  Positive for color change and rash.  Psychiatric/Behavioral: Negative.    All other systems reviewed and are negative.  Vital signs:  BP 122/79   Pulse 89   Temp 98.2 F (36.8 C) (Oral)   Resp 18   SpO2 97%  Exam:  Physical Exam Vitals reviewed.  Constitutional:      Appearance: She is obese.  HENT:     Mouth/Throat:     Mouth: Mucous membranes are moist.  Cardiovascular:     Rate and Rhythm: Normal rate.     Pulses: Normal pulses.  Pulmonary:     Effort: Pulmonary effort is normal.  Abdominal:     Palpations: Abdomen is soft.  Musculoskeletal:     Comments: Hx chronic back pain  Skin:    General: Skin is warm and dry.     Findings: Erythema and rash present.  Neurological:     Mental Status: She is alert and oriented to person, place, and time. Mental status is at baseline.  Psychiatric:        Mood and Affect: Mood normal.        Behavior: Behavior normal.     Stoma type/location:  LLQ colostomy Stomal assessment/size:  5/8 opening, recessed stoma  Peristomal assessment:  denuded skin circumferentially around stoma and erythema to perimeter from barrier strips.  Will implement flonase to skin irritations  Continue stoma powder and skin prep  Treatment options for stomal/peristomal skin: due to large area of irritation and weeping, will implement  Large Eakin seal instead of barrier ring and pouch as usual Output: soft brown stool  Ostomy pouching: 1pc. Convex with Eakin seal and large barrier strips.  Education provided:  performed pouch change.  Demonstrated Flonase and Large seal.  Patient verbalizes understanding.   Extra seal provided Is eager for ostomy reversal.  Making lifestyle changes in effort to lose weight.     Impression/dx  Irritant contact dermatitis Colostomy complication Discussion  Add large barrier seal Add Flonase to skin irritation Secure pouch perimeter with barrier strips Plan  See back 2 weeks Will update [facility]    Visit time: 55 minutes.   Darice Cooley FNP-BC

## 2024-08-14 ENCOUNTER — Ambulatory Visit (HOSPITAL_COMMUNITY)
Admission: RE | Admit: 2024-08-14 | Discharge: 2024-08-14 | Disposition: A | Discharge status: Home / Self Care | Source: Ambulatory Visit | Attending: Plastic Surgery | Admitting: Plastic Surgery

## 2024-08-14 DIAGNOSIS — E669 Obesity, unspecified: Secondary | ICD-10-CM

## 2024-08-14 DIAGNOSIS — K9400 Colostomy complication, unspecified: Secondary | ICD-10-CM | POA: Diagnosis not present

## 2024-08-14 DIAGNOSIS — L24B3 Irritant contact dermatitis related to fecal or urinary stoma or fistula: Secondary | ICD-10-CM | POA: Diagnosis not present

## 2024-08-14 DIAGNOSIS — Z433 Encounter for attention to colostomy: Secondary | ICD-10-CM | POA: Insufficient documentation

## 2024-08-14 NOTE — Progress Notes (Signed)
 [facility]   Reason for visit:  LLQ colostomy, recessed below skin level HPI:  Diverticulitis with perforation, end colostomy  Past Medical History:  Diagnosis Date   Arthritis    Obesity    Family History  Problem Relation Age of Onset   Diabetes Maternal Grandmother    Diabetes Maternal Grandfather    Diabetes Paternal Grandmother    Diabetes Paternal Grandfather    Stomach cancer Neg Hx    Colon cancer Neg Hx    Esophageal cancer Neg Hx    Allergies  Allergen Reactions   Codeine Hives and Itching   Compazine  [Prochlorperazine ] Hives and Itching   Pineapple Dermatitis, Hives, Itching and Swelling    Lip swelling and itching  Of Lips with associated Lip Swelling  Lip swelling and itching  Of Lips with associated Lip Swelling  Of Lips with associated Lip Swelling    Lip swelling and itching    Lip swelling and itching  Of Lips with associated Lip Swelling Lip swelling and itching   Current Outpatient Medications  Medication Sig Dispense Refill Last Dose/Taking   acetaminophen  (TYLENOL ) 500 MG tablet Take 2 tablets (1,000 mg total) by mouth every 6 (six) hours. 30 tablet 0    ALPRAZolam  (XANAX ) 0.5 MG tablet Take 1 tablet (0.5 mg total) by mouth 2 (two) times daily as needed for anxiety. 4 tablet 0    ascorbic acid (VITAMIN C) 500 MG tablet Take 1,000 mg by mouth daily.      diphenhydramine -acetaminophen  (TYLENOL  PM) 25-500 MG TABS tablet Take 1 tablet by mouth at bedtime as needed (sleep).      hydrocortisone  cream 0.5 % Apply topically 4 (four) times daily. 30 g 0    levonorgestrel  (MIRENA ) 20 MCG/24HR IUD 1 each by Intrauterine route once. Implanted September 2020      lidocaine  (LIDODERM ) 5 % Place 1 patch onto the skin daily. Remove & Discard patch within 12 hours or as directed by MD 30 patch 0    methocarbamol  (ROBAXIN ) 500 MG tablet Take 1 tablet (500 mg total) by mouth every 6 (six) hours. 30 tablet 0    metoprolol  succinate (TOPROL -XL) 25 MG 24  hr tablet Take 0.5 tablets (12.5 mg total) by mouth daily.      nystatin (MYCOSTATIN/NYSTOP) powder Apply topically 2 (two) times daily.      VITAMIN D PO Take 1 tablet by mouth daily.      Zinc Citrate-Phytase (ZYTAZE) 25-500 MG CAPS Take 1 capsule by mouth daily.      No current facility-administered medications for this encounter.   ROS  Review of Systems  Respiratory: Negative.    Cardiovascular: Negative.   Gastrointestinal:        Colostomy, recessed  Endocrine:       Diabetes  Skin:  Positive for rash.       Peristomal irritation  Psychiatric/Behavioral: Negative.    All other systems reviewed and are negative.  Vital signs:  BP 131/83 (BP Location: Right Arm)   Pulse 94   Temp 98.2 F (36.8 C) (Oral)   Resp 18   SpO2 97%  Exam:  Physical Exam Vitals reviewed.  Constitutional:      Appearance: She is obese.  HENT:     Mouth/Throat:     Mouth: Mucous membranes are moist.  Cardiovascular:     Rate and Rhythm: Normal rate.  Pulmonary:     Effort: Pulmonary effort is normal.  Abdominal:     Palpations:  Abdomen is soft.  Skin:    General: Skin is warm and dry.  Neurological:     Mental Status: She is alert and oriented to person, place, and time. Mental status is at baseline.  Psychiatric:        Mood and Affect: Mood normal.        Behavior: Behavior normal.     Stoma type/location:  LMQ colostomy, recessed Stomal assessment/size:  5/8 below skin level  Peristomal assessment:  Erythema and denuded skin improved, but has been wearing pouch 5-7 days and there is some irritation.  Encouraged to change twice weekly.   Treatment options for stomal/peristomal skin: LArge EAKIN seal improves fit, [facility] has not sent those.  Will update order with them. COntinue FLonase as needed, stoma powder and skin prep barrier strips to perimeter.  Output: soft brown stool  Ostomy pouching: 1pc. Convex with large eakin seal.  Education provided:  Increase frequency of pouch  changes, large eakin seal to maintain seal and protect skin. WIll update [facility]     Impression/dx  Colostomy complication Irritant contact dermatitis Discussion  See back as needed  Plan  Pursuing reversal.      Visit time: 45 minutes.   Darice Cooley FNP-BC

## 2024-08-14 NOTE — Discharge Instructions (Signed)
 Will update [facility] for supplies 1 piece convex LArge Eakin seal Stoma powder and skin prep Barrier strips

## 2024-08-16 DIAGNOSIS — K9400 Colostomy complication, unspecified: Secondary | ICD-10-CM | POA: Insufficient documentation

## 2024-08-16 DIAGNOSIS — E6609 Other obesity due to excess calories: Secondary | ICD-10-CM | POA: Insufficient documentation

## 2024-08-28 ENCOUNTER — Ambulatory Visit (HOSPITAL_COMMUNITY): Admitting: Nurse Practitioner

## 2024-09-01 ENCOUNTER — Other Ambulatory Visit: Payer: Self-pay | Admitting: Urology

## 2024-10-13 NOTE — Progress Notes (Signed)
 Sent message, via epic in basket, requesting orders in epic from [facility].

## 2024-10-18 ENCOUNTER — Ambulatory Visit: Payer: Self-pay | Admitting: Surgery

## 2024-10-18 DIAGNOSIS — Z01818 Encounter for other preprocedural examination: Secondary | ICD-10-CM

## 2024-10-18 NOTE — Progress Notes (Signed)
 Second request for pre op orders spoke with [facility].

## 2024-10-19 NOTE — Patient Instructions (Signed)
 SURGICAL WAITING ROOM VISITATION Patients having surgery or a procedure may have no more than 2 support people in the waiting area - these visitors may rotate in the visitor waiting room.   If the patient needs to stay at the hospital during part of their recovery, the visitor guidelines for inpatient rooms apply.  PRE-OP VISITATION  Pre-op nurse will coordinate an appropriate time for 1 support person to accompany the patient in pre-op.  This support person may not rotate.  This visitor will be contacted when the time is appropriate for the visitor to come back in the pre-op area.  Please refer to the [facility] website for the visitor guidelines for Inpatients (after your surgery is over and you are in a regular room).  You are not required to quarantine at this time prior to your surgery. However, you must do this: Hand Hygiene often Do NOT share personal items Notify your provider if you are in close contact with someone who has COVID or you develop fever 100.4 or greater, new onset of sneezing, cough, sore throat, shortness of breath or body aches.  If you test positive for Covid or have been in contact with anyone that has tested positive in the last 10 days please notify you surgeon.    Your procedure is scheduled on:  Wednesday  October 25, 2024  Report to [facility] Main Entrance: [address] entrance where the [employer] is available.   Report to admitting at: 06:15    AM  Call this number if you have any questions or problems the morning of surgery [phone]  FOLLOW ANY ADDITIONAL PRE OP INSTRUCTIONS YOU RECEIVED FROM YOUR SURGEON'S OFFICE!!!   Dulcolax 20 mg (total) - Take 4 (four) of the 5 mg Dulcolax tablets with water at 07:00 am the day prior to surgery.  Miralax 238 g - Mix with 64 oz Gatorade/Powerade.  Starting at 10:00 am ,Drink this gradually over the next few hours (8 oz glass every 15-30 minutes) until gone the day prior to surgery You should finish  in 4 hours-6 hours.    Neomycin 1000 mg - At 2 pm, 3 pm and 10 pm after Miralax  bowel prep the day prior to surgery.  Metronidazole  1000 mg - At 2 pm, 3 pm and 10 pm after Miralax bowel prep the day prior to surgery.   Ondansetron  (Zofran ) can be used if you have any nausea from the other bowel prep medications.  Drink plenty of clear liquids all evening to avoid getting dehydrated.   DRINK two (2) bottles of Pre-Surgery G2 drink starting at 6:00 pm the evening prior to your surgery to help prevent dehydration. Increase drinking clear fluids (see list below)          Do not eat food after Midnight the night prior to your surgery/procedure.  After Midnight you may have the following liquids until   05:30  AM DAY OF SURGERY  Clear Liquid Diet Water Black Coffee (sugar ok, NO MILK/CREAM OR CREAMERS)  Tea (sugar ok, NO MILK/CREAM OR CREAMERS) regular and decaf                             Plain Jell-O  with no fruit (NO RED)  Fruit ices (not with fruit pulp, NO RED)                                     Popsicles (NO RED)                                                                  Juice: NO CITRUS JUICES: only apple, WHITE grape, WHITE cranberry Sports drinks like Gatorade or Powerade (NO RED)                   The day of surgery:  Drink ONE (1) Pre-Surgery G2 at   05:30 AM the morning of surgery. Drink in one sitting. Do not sip.  This drink was given to you during your hospital pre-op appointment visit. Nothing else to drink after completing the Pre-Surgery G2 : No candy, chewing gum or throat lozenges.     Oral Hygiene is also important to reduce your risk of infection.        Remember - BRUSH YOUR TEETH THE MORNING OF SURGERY WITH YOUR REGULAR TOOTHPASTE  Do NOT smoke after Midnight the night before surgery.  STOP TAKING all Vitamins, Herbs and supplements 1 week before your surgery.   Phentermine- stop taking before surgery.  Patient will stop taking today  10-20-2024  Take ONLY these medicines the morning of surgery with A SIP OF WATER: Alprazolam  and Tylenol  if needed.                    You may not have any metal on your body including hair pins, jewelry, and body piercing  Do not wear make-up, lotions, powders, perfumes / cologne, or deodorant  Do not wear nail polish including gel and S&S, artificial / acrylic nails, or any other type of covering on natural nails including finger and toenails. If you have artificial nails, gel coating, etc., that needs to be removed by a nail salon, Please have this removed prior to surgery. Not doing so may mean that your surgery could be cancelled or delayed if the Surgeon or anesthesia staff feels like they are unable to monitor you safely.   Do not shave 48 hours prior to surgery to avoid nicks in your skin which may contribute to postoperative infections.   Men may shave face and neck.  Contacts, Hearing Aids, dentures or bridgework may not be worn into surgery. DENTURES WILL BE REMOVED PRIOR TO SURGERY PLEASE DO NOT APPLY Poly grip OR ADHESIVES!!!  Patients discharged on the day of surgery will not be allowed to drive home.  Someone NEEDS to stay with you for the first 24 hours after anesthesia.  Do not bring your home medications to the hospital. The Pharmacy will dispense medications listed on your medication list to you during your admission in the Hospital.  Special Instructions: Bring a copy of your healthcare power of attorney and living will documents the day of surgery, if you wish to have them scanned into your [facility]- EPIC  Please read over the following fact sheets you were given: IF YOU HAVE QUESTIONS ABOUT YOUR PRE-OP INSTRUCTIONS, PLEASE CALL [phone]   [facility] - Preparing for Surgery  Before surgery, you can play an important role.  Because skin is not sterile, your skin needs to be as free of germs as possible.  You  can reduce the number of germs on your skin by washing with CHG (chlorahexidine gluconate) soap before surgery.  CHG is an antiseptic cleaner which kills germs and bonds with the skin to continue killing germs even after washing. Please DO NOT use if you have an allergy to CHG or antibacterial soaps.  If your skin becomes reddened/irritated stop using the CHG and inform your nurse when you arrive at Short Stay. Do not shave (including legs and underarms) for at least 48 hours prior to the first CHG shower.  You may shave your face/neck.  Please follow these instructions carefully:  1.  Shower with CHG Soap the night before surgery ONLY (DO NOT USE THE CHG SOAP THE MORNING OF SURGERY).  2.  If you choose to wash your hair, wash your hair first as usual with your normal  shampoo.  3.  After you shampoo, rinse your hair and body thoroughly to remove the shampoo.                             4.  Use CHG as you would any other liquid soap.  You can apply chg directly to the skin and wash.  Gently with a scrungie or clean washcloth.  5.  Apply the CHG Soap to your body ONLY FROM THE NECK DOWN.   Do not use on face/ open                           Wound or open sores. Avoid contact with eyes, ears mouth and genitals (private parts).                       Wash face,  Genitals (private parts) with your normal soap.             6.  Wash thoroughly, paying special attention to the area where your  surgery  will be performed.  7.  Thoroughly rinse your body with warm water from the neck down.  8.  DO NOT shower/wash with your normal soap after using and rinsing off the CHG Soap.                9.  Pat yourself dry with a clean towel.            10.  Wear clean pajamas.            11.  Place clean sheets on your bed the night of your first shower and do not  sleep with pets.  Day of Surgery : Do not apply any CHG, lotions/deodorants the morning of surgery.  Please wear clean clothes to the hospital/surgery  center.   FAILURE TO FOLLOW THESE INSTRUCTIONS MAY RESULT IN THE CANCELLATION OF YOUR SURGERY  PATIENT SIGNATURE_________________________________  NURSE SIGNATURE__________________________________  ________________________________________________________________________

## 2024-10-19 NOTE — Progress Notes (Signed)
 COVID Vaccine received:  []  No [x]  Yes Date of any COVID positive Test in last 90 days:  none  PCP - Chuckie Hamilton, FNP  Cardiologist - none  Chest x-ray - 06-20-2022  1v   EKG -  2023  repeat Stress Test -  ECHO - 06-20-2022  Epic Cardiac Cath -  CT Coronary Calcium score:   Bowel Prep - []  No  [x]   Yes ___CCS prep___  Pacemaker / ICD device [x]  No []  Yes   Spinal Cord Stimulator:[x]  No []  Yes       History of Sleep Apnea? []  No [x]  Yes   CPAP used?- [x]  No []  Yes    Patient has: []  NO Hx DM   [x]  Pre-DM   []  DM1  []   DM2 Does the patient monitor blood sugar?   []  N/A   [x]  No []  Yes  Last A1c was: 6.3 on 11-12-2023      Blood Thinner / Instructions:  none Aspirin Instructions:  None  Activity level: Able to walk up 2 flights of stairs without becoming significantly short of breath or having chest pain?  []  No   [x]    Yes  Patient can perform ADLs without assistance. []  No   [x]   Yes  Anesthesia review: Current Phentermine use (pt to stop today), Pre-DM, HTN,  Migraine, anemia   STOP BANG 5 - sent to PCP  Patient denies any S&S of respiratory illness or Covid - no shortness of breath, fever, cough or chest pain at PAT appointment.  Patient verbalized understanding and agreement to the Pre-Surgical Instructions that were given to them at this PAT appointment. Patient was also educated of the need to review these PAT instructions again prior to her surgery.I reviewed the appropriate phone numbers to call if they have any and questions or concerns.

## 2024-10-20 ENCOUNTER — Encounter (HOSPITAL_COMMUNITY)
Admission: RE | Admit: 2024-10-20 | Discharge: 2024-10-20 | Disposition: A | Discharge status: Home / Self Care | Source: Ambulatory Visit | Attending: Surgery | Admitting: Surgery

## 2024-10-20 ENCOUNTER — Other Ambulatory Visit: Payer: Self-pay

## 2024-10-20 ENCOUNTER — Encounter (HOSPITAL_COMMUNITY): Payer: Self-pay | Admitting: *Deleted

## 2024-10-20 VITALS — BP 121/74 | HR 80 | Temp 98.3°F | Resp 22 | Ht 64.0 in | Wt 268.0 lb

## 2024-10-20 DIAGNOSIS — Z79899 Other long term (current) drug therapy: Secondary | ICD-10-CM | POA: Diagnosis not present

## 2024-10-20 DIAGNOSIS — I10 Essential (primary) hypertension: Secondary | ICD-10-CM | POA: Diagnosis not present

## 2024-10-20 DIAGNOSIS — T505X5S Adverse effect of appetite depressants, sequela: Secondary | ICD-10-CM | POA: Insufficient documentation

## 2024-10-20 DIAGNOSIS — Z933 Colostomy status: Secondary | ICD-10-CM | POA: Insufficient documentation

## 2024-10-20 DIAGNOSIS — Z01818 Encounter for other preprocedural examination: Secondary | ICD-10-CM | POA: Insufficient documentation

## 2024-10-20 DIAGNOSIS — R7303 Prediabetes: Secondary | ICD-10-CM

## 2024-10-20 DIAGNOSIS — Z01810 Encounter for preprocedural cardiovascular examination: Secondary | ICD-10-CM | POA: Diagnosis present

## 2024-10-20 DIAGNOSIS — Z01812 Encounter for preprocedural laboratory examination: Secondary | ICD-10-CM | POA: Diagnosis present

## 2024-10-20 HISTORY — DX: Essential (primary) hypertension: I10

## 2024-10-20 HISTORY — DX: Headache, unspecified: R51.9

## 2024-10-20 HISTORY — DX: Anxiety disorder, unspecified: F41.9

## 2024-10-20 HISTORY — DX: Prediabetes: R73.03

## 2024-10-20 LAB — COMPREHENSIVE METABOLIC PANEL WITH GFR
ALT: 19 U/L (ref 0–44)
AST: 17 U/L (ref 15–41)
Albumin: 4 g/dL (ref 3.5–5.0)
Alkaline Phosphatase: 109 U/L (ref 38–126)
Anion gap: 10 (ref 5–15)
BUN: 17 mg/dL (ref 6–20)
CO2: 24 mmol/L (ref 22–32)
Calcium: 9.5 mg/dL (ref 8.9–10.3)
Chloride: 103 mmol/L (ref 98–111)
Creatinine, Ser: 0.79 mg/dL (ref 0.44–1.00)
GFR, Estimated: >60 mL/min (ref >60)
Glucose, Bld: 97 mg/dL (ref 70–99)
Potassium: 3.6 mmol/L (ref 3.5–5.1)
Sodium: 137 mmol/L (ref 135–145)
Total Bilirubin: 0.4 mg/dL (ref 0.0–1.2)
Total Protein: 7.6 g/dL (ref 6.5–8.1)

## 2024-10-20 LAB — CBC WITH DIFFERENTIAL/PLATELET
Abs Immature Granulocytes: 0.02 K/uL (ref 0.00–0.07)
Basophils Absolute: 0 K/uL (ref 0.0–0.1)
Basophils Relative: 1 %
Eosinophils Absolute: 0.2 K/uL (ref 0.0–0.5)
Eosinophils Relative: 2 %
HCT: 40.5 % (ref 36.0–46.0)
Hemoglobin: 12.8 g/dL (ref 12.0–15.0)
Immature Granulocytes: 0 %
Lymphocytes Relative: 26 %
Lymphs Abs: 1.7 K/uL (ref 0.7–4.0)
MCH: 26.6 pg (ref 26.0–34.0)
MCHC: 31.6 g/dL (ref 30.0–36.0)
MCV: 84.2 fL (ref 80.0–100.0)
Monocytes Absolute: 0.6 K/uL (ref 0.1–1.0)
Monocytes Relative: 8 %
Neutro Abs: 4.2 K/uL (ref 1.7–7.7)
Neutrophils Relative %: 63 %
Platelets: 347 K/uL (ref 150–400)
RBC: 4.81 MIL/uL (ref 3.87–5.11)
RDW: 16.5 % — ABNORMAL HIGH (ref 11.5–15.5)
WBC: 6.7 K/uL (ref 4.0–10.5)
nRBC: 0 % (ref 0.0–0.2)

## 2024-10-20 NOTE — Progress Notes (Signed)
   10/20/24 1113  OBSTRUCTIVE SLEEP APNEA  Have you ever been diagnosed with sleep apnea through a sleep study? No  Do you snore loudly (loud enough to be heard through closed doors)?  1  Do you often feel tired, fatigued, or sleepy during the daytime (such as falling asleep during driving or talking to someone)? 0  Has anyone observed you stop breathing during your sleep? 1  Do you have, or are you being treated for high blood pressure? 1  BMI more than 35 kg/m2? 1  Age > 50 (1-yes) 1  Neck circumference greater than:Female 16 inches or larger, Female 17inches or larger? 0  Female Gender (Yes=1) 0  Obstructive Sleep Apnea Score 5  Score 5 or greater  Results sent to PCP     STOP BANG 5     routed to PCP

## 2024-10-23 NOTE — Progress Notes (Signed)
 Anesthesia Chart Review   Case: [id number] Date/Time: 10/25/24 0815   Procedures:      CLOSURE, COLOSTOMY, ROBOT-ASSISTED - ROBOTIC COLOSTOMY TAKEDOWN POSSIBLE LOW ANTERIOR RESECTION LYSIS OF ADHESIONS     RESECTION, RECTUM, LOW ANTERIOR, ROBOT-ASSISTED     LYSIS, ADHESIONS, LAPAROSCOPIC     CYSTOSCOPY WITH INDOCYANINE GREEN IMAGING (ICG)   Anesthesia type: General   Diagnosis: Colostomy status (HCC) [Z93.3]   Pre-op diagnosis: COLOSTOMY STATUS   Location[facility] ROOM 02 / [facility]   Surgeons: Teresa Lonni HERO, MD; Devere Lonni Righter, MD       DISCUSSION:53 y.o. never smoker with h/o HTN, colostomy in place scheduled for above procedure 10/25/2024 with Dr. Lonni Teresa and Dr. Lonni Devere.   S/p Hartman's procedure 06/05/2022. No anesthesia complications noted.   Pt reports she can climb a flight of stairs without chest pain or shortness of breath. EKD with no acute findings.  VS: BP 121/74   Pulse 80   Temp 36.8 C (Oral)   Resp (!) 22   Ht 5' 4 (1.626 m)   Wt 121.6 kg   LMP  (LMP Unknown)   SpO2 100%   BMI 46.00 kg/m   PROVIDERS: Glendia Chuckie FALCON, FNP is PCP    LABS: Labs reviewed: Acceptable for surgery. (all labs ordered are listed, but only abnormal results are displayed)  Labs Reviewed  CBC WITH DIFFERENTIAL/PLATELET - Abnormal; Notable for the following components:      Result Value   RDW 16.5 (*)    All other components within normal limits  COMPREHENSIVE METABOLIC PANEL WITH GFR  TYPE AND SCREEN     IMAGES:   EKG:   CV: Echo 06/20/2022 1. Left ventricular ejection fraction, by estimation, is 65 to 70%. The  left ventricle has normal function. The left ventricle has no regional  wall motion abnormalities. Left ventricular diastolic parameters were  normal.   2. Right ventricular systolic function is normal. The right ventricular  size is normal.   3. The mitral valve was not well visualized. Trivial mitral valve  regurgitation.  No evidence of mitral stenosis.   4. The tricuspid valve is abnormal.   5. The aortic valve is tricuspid. Aortic valve regurgitation is not  visualized. No aortic stenosis is present.  Past Medical History:  Diagnosis Date   Anxiety    Arthritis    Headache    migraines   Hypertension    Obesity    Pre-diabetes     Past Surgical History:  Procedure Laterality Date   CESAREAN SECTION     COLECTOMY N/A 06/05/2022   Procedure: TOTAL COLECTOMY;  Surgeon: Aron Shoulders, MD;  Location: [facility];  Service: General;  Laterality: N/A;   KNEE SURGERY Left 2020   arthroplasty, TKA  at [facility] SURGERY Right 1996   SLAP  repair from [facility]    MEDICATIONS:  acetaminophen  (TYLENOL ) 500 MG tablet   ALPRAZolam  (XANAX ) 0.25 MG tablet   Ascorbic Acid (VITAMIN C PO)   ASHWAGANDHA PO   diphenhydramine -acetaminophen  (TYLENOL  PM) 25-500 MG TABS tablet   furosemide (LASIX) 40 MG tablet   levonorgestrel  (MIRENA ) 20 MCG/24HR IUD   metroNIDAZOLE  (FLAGYL ) 500 MG tablet   neomycin (MYCIFRADIN) 500 MG tablet   nystatin (MYCOSTATIN/NYSTOP) powder   ondansetron  (ZOFRAN -ODT) 4 MG disintegrating tablet   phentermine (ADIPEX-P) 37.5 MG tablet   topiramate (TOPAMAX) 100 MG tablet   VITAMIN D PO   Zinc Citrate-Phytase (ZYTAZE) 25-500 MG CAPS   No  current facility-administered medications for this encounter.   Harlene Hoots Ward, PA-C [facility] Pre-Surgical Testing [phone]

## 2024-10-25 ENCOUNTER — Encounter (HOSPITAL_COMMUNITY)
Admission: RE | Disposition: A | Discharge status: Home / Self Care | Payer: Self-pay | Source: Home / Self Care | Attending: Surgery

## 2024-10-25 ENCOUNTER — Inpatient Hospital Stay (HOSPITAL_COMMUNITY): Admitting: Certified Registered"

## 2024-10-25 ENCOUNTER — Other Ambulatory Visit: Payer: Self-pay

## 2024-10-25 ENCOUNTER — Encounter (HOSPITAL_COMMUNITY): Payer: Self-pay | Admitting: Surgery

## 2024-10-25 ENCOUNTER — Inpatient Hospital Stay (HOSPITAL_COMMUNITY): Payer: Self-pay | Admitting: Physician Assistant

## 2024-10-25 ENCOUNTER — Inpatient Hospital Stay (HOSPITAL_COMMUNITY)
Admission: RE | Admit: 2024-10-25 | Discharge: 2024-10-28 | DRG: 336 | Disposition: A | Discharge status: Home / Self Care | Attending: Surgery | Admitting: Surgery

## 2024-10-25 DIAGNOSIS — Z885 Allergy status to narcotic agent status: Secondary | ICD-10-CM

## 2024-10-25 DIAGNOSIS — Z79899 Other long term (current) drug therapy: Secondary | ICD-10-CM

## 2024-10-25 DIAGNOSIS — Z91018 Allergy to other foods: Secondary | ICD-10-CM

## 2024-10-25 DIAGNOSIS — Z6841 Body Mass Index (BMI) 40.0 and over, adult: Secondary | ICD-10-CM | POA: Diagnosis not present

## 2024-10-25 DIAGNOSIS — I10 Essential (primary) hypertension: Secondary | ICD-10-CM | POA: Diagnosis present

## 2024-10-25 DIAGNOSIS — Z888 Allergy status to other drugs, medicaments and biological substances status: Secondary | ICD-10-CM | POA: Diagnosis not present

## 2024-10-25 DIAGNOSIS — E785 Hyperlipidemia, unspecified: Secondary | ICD-10-CM | POA: Diagnosis present

## 2024-10-25 DIAGNOSIS — R7303 Prediabetes: Secondary | ICD-10-CM | POA: Diagnosis present

## 2024-10-25 DIAGNOSIS — Z96652 Presence of left artificial knee joint: Secondary | ICD-10-CM | POA: Diagnosis present

## 2024-10-25 DIAGNOSIS — F419 Anxiety disorder, unspecified: Secondary | ICD-10-CM | POA: Diagnosis present

## 2024-10-25 DIAGNOSIS — K660 Peritoneal adhesions (postprocedural) (postinfection): Secondary | ICD-10-CM | POA: Diagnosis present

## 2024-10-25 DIAGNOSIS — G43909 Migraine, unspecified, not intractable, without status migrainosus: Secondary | ICD-10-CM | POA: Diagnosis present

## 2024-10-25 DIAGNOSIS — Z833 Family history of diabetes mellitus: Secondary | ICD-10-CM | POA: Diagnosis not present

## 2024-10-25 DIAGNOSIS — E66813 Obesity, class 3: Secondary | ICD-10-CM | POA: Diagnosis present

## 2024-10-25 DIAGNOSIS — Z433 Encounter for attention to colostomy: Secondary | ICD-10-CM | POA: Diagnosis present

## 2024-10-25 DIAGNOSIS — K565 Intestinal adhesions [bands], unspecified as to partial versus complete obstruction: Secondary | ICD-10-CM | POA: Diagnosis not present

## 2024-10-25 DIAGNOSIS — Z9889 Other specified postprocedural states: Principal | ICD-10-CM

## 2024-10-25 DIAGNOSIS — Z01818 Encounter for other preprocedural examination: Secondary | ICD-10-CM

## 2024-10-25 HISTORY — PX: XI ROBOTIC ASSISTED COLOSTOMY TAKEDOWN: SHX6828

## 2024-10-25 HISTORY — PX: CYSTOSCOPY WITH INDOCYANINE GREEN IMAGING (ICG): SHX7549

## 2024-10-25 HISTORY — PX: XI ROBOTIC ASSISTED LOWER ANTERIOR RESECTION: SHX6558

## 2024-10-25 HISTORY — PX: LAPAROSCOPIC LYSIS OF ADHESIONS: SHX5905

## 2024-10-25 LAB — TYPE AND SCREEN
ABO/RH(D): AB POS
Antibody Screen: NEGATIVE

## 2024-10-25 LAB — POCT PREGNANCY, URINE: Preg Test, Ur: NEGATIVE

## 2024-10-25 SURGERY — CLOSURE, COLOSTOMY, ROBOT-ASSISTED
Anesthesia: General | Site: Abdomen

## 2024-10-25 MED ORDER — SODIUM CHLORIDE (PF) 0.9 % IJ SOLN
INTRAMUSCULAR | Status: AC
  Filled 2024-10-25: qty 10

## 2024-10-25 MED ORDER — SODIUM CHLORIDE 0.9 % IR SOLN
Status: DC | PRN
  Administered 2024-10-25: 1000 mL via INTRAVESICAL

## 2024-10-25 MED ORDER — CARMEX CLASSIC LIP BALM EX OINT
TOPICAL_OINTMENT | CUTANEOUS | Status: DC | PRN
  Filled 2024-10-25: qty 10

## 2024-10-25 MED ORDER — SIMETHICONE 80 MG PO CHEW
40.0000 mg | CHEWABLE_TABLET | Freq: Four times a day (QID) | ORAL | Status: DC | PRN
Start: 2024-10-25 — End: 2024-10-28
  Administered 2024-10-26 – 2024-10-28 (×3): 40 mg via ORAL
  Filled 2024-10-25 (×3): qty 1

## 2024-10-25 MED ORDER — STERILE WATER FOR INJECTION IJ SOLN
INTRAMUSCULAR | Status: AC
  Filled 2024-10-25: qty 10

## 2024-10-25 MED ORDER — HYDRALAZINE HCL 20 MG/ML IJ SOLN: 10.0000 mg | INTRAMUSCULAR | Status: DC | PRN

## 2024-10-25 MED ORDER — BISACODYL 5 MG PO TBEC: 20.0000 mg | DELAYED_RELEASE_TABLET | Freq: Once | ORAL | Status: DC

## 2024-10-25 MED ORDER — SUGAMMADEX SODIUM 200 MG/2ML IV SOLN
INTRAVENOUS | Status: AC
  Filled 2024-10-25: qty 2

## 2024-10-25 MED ORDER — PROPOFOL 10 MG/ML IV BOLUS
INTRAVENOUS | Status: DC | PRN
  Administered 2024-10-25: 170 mg via INTRAVENOUS

## 2024-10-25 MED ORDER — HYDROMORPHONE HCL 1 MG/ML IJ SOLN: 0.5000 mg | INTRAMUSCULAR | Status: DC | PRN

## 2024-10-25 MED ORDER — DIPHENHYDRAMINE HCL 12.5 MG/5ML PO ELIX: 12.5000 mg | ORAL_SOLUTION | Freq: Four times a day (QID) | ORAL | Status: DC | PRN

## 2024-10-25 MED ORDER — HEPARIN SODIUM (PORCINE) 5000 UNIT/ML IJ SOLN
5000.0000 [IU] | Freq: Once | INTRAMUSCULAR | Status: AC
  Administered 2024-10-25: 5000 [IU] via SUBCUTANEOUS
  Filled 2024-10-25: qty 1

## 2024-10-25 MED ORDER — LACTATED RINGERS IV SOLN: INTRAVENOUS | Status: DC

## 2024-10-25 MED ORDER — ENSURE SURGERY PO LIQD
237.0000 mL | Freq: Two times a day (BID) | ORAL | Status: DC
  Administered 2024-10-25 – 2024-10-28 (×3): 237 mL via ORAL

## 2024-10-25 MED ORDER — SODIUM CHLORIDE 0.9 % IV SOLN
2.0000 g | INTRAVENOUS | Status: AC
  Administered 2024-10-25: 2 g via INTRAVENOUS
  Filled 2024-10-25: qty 2

## 2024-10-25 MED ORDER — METRONIDAZOLE 500 MG PO TABS: 1000.0000 mg | ORAL_TABLET | ORAL | Status: DC

## 2024-10-25 MED ORDER — ACETAMINOPHEN 500 MG PO TABS
1000.0000 mg | ORAL_TABLET | Freq: Four times a day (QID) | ORAL | Status: DC
  Administered 2024-10-25 – 2024-10-28 (×9): 1000 mg via ORAL
  Filled 2024-10-25 (×9): qty 2

## 2024-10-25 MED ORDER — POLYETHYLENE GLYCOL 3350 17 GM/SCOOP PO POWD: 238.0000 g | Freq: Once | ORAL | Status: DC

## 2024-10-25 MED ORDER — FENTANYL CITRATE (PF) 250 MCG/5ML IJ SOLN
INTRAMUSCULAR | Status: DC | PRN
  Administered 2024-10-25: 100 ug via INTRAVENOUS

## 2024-10-25 MED ORDER — NYSTATIN 100000 UNIT/GM EX POWD: 1.0000 | Freq: Two times a day (BID) | CUTANEOUS | Status: DC | PRN

## 2024-10-25 MED ORDER — BUPIVACAINE-EPINEPHRINE (PF) 0.25% -1:200000 IJ SOLN
INTRAMUSCULAR | Status: AC
  Filled 2024-10-25: qty 30

## 2024-10-25 MED ORDER — ENSURE PRE-SURGERY PO LIQD: 592.0000 mL | Freq: Once | ORAL | Status: DC

## 2024-10-25 MED ORDER — TOPIRAMATE 100 MG PO TABS
100.0000 mg | ORAL_TABLET | Freq: Every evening | ORAL | Status: DC
  Administered 2024-10-25: 100 mg via ORAL
  Filled 2024-10-25 (×2): qty 1

## 2024-10-25 MED ORDER — ONDANSETRON HCL 4 MG PO TABS: 4.0000 mg | ORAL_TABLET | Freq: Four times a day (QID) | ORAL | Status: DC | PRN

## 2024-10-25 MED ORDER — CHLORHEXIDINE GLUCONATE CLOTH 2 % EX PADS: 6.0000 | MEDICATED_PAD | Freq: Once | CUTANEOUS | Status: DC

## 2024-10-25 MED ORDER — FUROSEMIDE 40 MG PO TABS
40.0000 mg | ORAL_TABLET | Freq: Every day | ORAL | Status: DC
  Administered 2024-10-26 – 2024-10-28 (×3): 40 mg via ORAL
  Filled 2024-10-25 (×3): qty 1

## 2024-10-25 MED ORDER — ACETAMINOPHEN 500 MG PO TABS
1000.0000 mg | ORAL_TABLET | ORAL | Status: AC
  Administered 2024-10-25: 1000 mg via ORAL
  Filled 2024-10-25: qty 2

## 2024-10-25 MED ORDER — LACTATED RINGERS IR SOLN
Status: DC | PRN
  Administered 2024-10-25: 1000 mL

## 2024-10-25 MED ORDER — DEXAMETHASONE SOD PHOSPHATE PF 10 MG/ML IJ SOLN
INTRAMUSCULAR | Status: DC | PRN
  Administered 2024-10-25: 10 mg via INTRAVENOUS

## 2024-10-25 MED ORDER — ENSURE PRE-SURGERY PO LIQD: 296.0000 mL | Freq: Once | ORAL | Status: DC

## 2024-10-25 MED ORDER — ROCURONIUM BROMIDE 10 MG/ML (PF) SYRINGE
PREFILLED_SYRINGE | INTRAVENOUS | Status: AC
  Filled 2024-10-25: qty 10

## 2024-10-25 MED ORDER — LIDOCAINE 2% (20 MG/ML) 5 ML SYRINGE
INTRAMUSCULAR | Status: DC | PRN
  Administered 2024-10-25: 40 mg via INTRAVENOUS

## 2024-10-25 MED ORDER — ALVIMOPAN 12 MG PO CAPS
12.0000 mg | ORAL_CAPSULE | Freq: Two times a day (BID) | ORAL | Status: DC
  Administered 2024-10-26: 12 mg via ORAL
  Filled 2024-10-25 (×2): qty 1

## 2024-10-25 MED ORDER — LIDOCAINE HCL (PF) 2 % IJ SOLN
INTRAMUSCULAR | Status: AC
  Filled 2024-10-25: qty 5

## 2024-10-25 MED ORDER — ONDANSETRON HCL 4 MG/2ML IJ SOLN
INTRAMUSCULAR | Status: AC
  Filled 2024-10-25: qty 2

## 2024-10-25 MED ORDER — PHENYLEPHRINE HCL (PRESSORS) 10 MG/ML IV SOLN
INTRAVENOUS | Status: DC | PRN
  Administered 2024-10-25: 160 ug via INTRAVENOUS

## 2024-10-25 MED ORDER — PHENYLEPHRINE 80 MCG/ML (10ML) SYRINGE FOR IV PUSH (FOR BLOOD PRESSURE SUPPORT)
PREFILLED_SYRINGE | INTRAVENOUS | Status: AC
  Filled 2024-10-25: qty 10

## 2024-10-25 MED ORDER — NEOMYCIN SULFATE 500 MG PO TABS: 1000.0000 mg | ORAL_TABLET | ORAL | Status: DC

## 2024-10-25 MED ORDER — CHLORHEXIDINE GLUCONATE 0.12 % MT SOLN
15.0000 mL | Freq: Once | OROMUCOSAL | Status: AC
  Administered 2024-10-25: 15 mL via OROMUCOSAL

## 2024-10-25 MED ORDER — DIPHENHYDRAMINE HCL 50 MG/ML IJ SOLN: 12.5000 mg | Freq: Four times a day (QID) | INTRAMUSCULAR | Status: DC | PRN

## 2024-10-25 MED ORDER — FENTANYL CITRATE (PF) 100 MCG/2ML IJ SOLN
INTRAMUSCULAR | Status: AC
  Filled 2024-10-25: qty 2

## 2024-10-25 MED ORDER — IBUPROFEN 400 MG PO TABS
600.0000 mg | ORAL_TABLET | Freq: Four times a day (QID) | ORAL | Status: DC | PRN
  Administered 2024-10-25 – 2024-10-26 (×2): 600 mg via ORAL
  Filled 2024-10-25 (×2): qty 1

## 2024-10-25 MED ORDER — BUPIVACAINE-EPINEPHRINE (PF) 0.25% -1:200000 IJ SOLN
INTRAMUSCULAR | Status: AC
  Filled 2024-10-25: qty 60

## 2024-10-25 MED ORDER — STERILE WATER FOR INJECTION IJ SOLN
INTRAMUSCULAR | Status: DC | PRN
  Administered 2024-10-25: 15 mL via INTRAMUSCULAR

## 2024-10-25 MED ORDER — MIDAZOLAM HCL (PF) 2 MG/2ML IJ SOLN
INTRAMUSCULAR | Status: DC | PRN
  Administered 2024-10-25: 2 mg via INTRAVENOUS

## 2024-10-25 MED ORDER — ORAL CARE MOUTH RINSE: 15.0000 mL | Freq: Once | OROMUCOSAL | Status: AC

## 2024-10-25 MED ORDER — ONDANSETRON HCL 4 MG/2ML IJ SOLN: 4.0000 mg | Freq: Four times a day (QID) | INTRAMUSCULAR | Status: DC | PRN

## 2024-10-25 MED ORDER — ROCURONIUM 10MG/ML (10ML) SYRINGE FOR MEDFUSION PUMP - OPTIME
INTRAVENOUS | Status: DC | PRN
Start: 2024-10-25 — End: 2024-10-25
  Administered 2024-10-25: 50 mg via INTRAVENOUS
  Administered 2024-10-25: 30 mg via INTRAVENOUS
  Administered 2024-10-25: 50 mg via INTRAVENOUS

## 2024-10-25 MED ORDER — MIDAZOLAM HCL 2 MG/2ML IJ SOLN
INTRAMUSCULAR | Status: AC
  Filled 2024-10-25: qty 2

## 2024-10-25 MED ORDER — PHENYLEPHRINE HCL-NACL 20-0.9 MG/250ML-% IV SOLN
INTRAVENOUS | Status: AC
  Filled 2024-10-25: qty 500

## 2024-10-25 MED ORDER — VITAMIN C 500 MG PO TABS
250.0000 mg | ORAL_TABLET | Freq: Every day | ORAL | Status: DC
  Administered 2024-10-26 – 2024-10-28 (×3): 250 mg via ORAL
  Filled 2024-10-25 (×3): qty 1

## 2024-10-25 MED ORDER — HEPARIN SODIUM (PORCINE) 5000 UNIT/ML IJ SOLN
5000.0000 [IU] | Freq: Three times a day (TID) | INTRAMUSCULAR | Status: DC
  Administered 2024-10-25 – 2024-10-28 (×8): 5000 [IU] via SUBCUTANEOUS
  Filled 2024-10-25 (×8): qty 1

## 2024-10-25 MED ORDER — ONDANSETRON HCL 4 MG/2ML IJ SOLN
INTRAMUSCULAR | Status: DC | PRN
  Administered 2024-10-25: 4 mg via INTRAVENOUS

## 2024-10-25 MED ORDER — TRAMADOL HCL 50 MG PO TABS
50.0000 mg | ORAL_TABLET | Freq: Four times a day (QID) | ORAL | Status: DC | PRN
  Administered 2024-10-26 – 2024-10-27 (×3): 50 mg via ORAL
  Filled 2024-10-25 (×3): qty 1

## 2024-10-25 MED ORDER — PROPOFOL 10 MG/ML IV BOLUS
INTRAVENOUS | Status: AC
Start: 2024-10-25 — End: 2024-10-25
  Filled 2024-10-25: qty 20

## 2024-10-25 MED ORDER — ALPRAZOLAM 0.25 MG PO TABS: 0.2500 mg | ORAL_TABLET | Freq: Every day | ORAL | Status: DC | PRN

## 2024-10-25 MED ORDER — ALUM & MAG HYDROXIDE-SIMETH 200-200-20 MG/5ML PO SUSP
30.0000 mL | Freq: Four times a day (QID) | ORAL | Status: DC | PRN
Start: 2024-10-25 — End: 2024-10-28
  Administered 2024-10-26 – 2024-10-28 (×3): 30 mL via ORAL
  Filled 2024-10-25 (×3): qty 30

## 2024-10-25 MED ORDER — GLYCOPYRROLATE 0.2 MG/ML IJ SOLN
INTRAMUSCULAR | Status: DC | PRN
  Administered 2024-10-25: .1 mg via INTRAVENOUS

## 2024-10-25 MED ORDER — BUPIVACAINE-EPINEPHRINE (PF) 0.25% -1:200000 IJ SOLN
INTRAMUSCULAR | Status: DC | PRN
  Administered 2024-10-25: 60 mL

## 2024-10-25 MED ORDER — HYDROMORPHONE HCL 2 MG/ML IJ SOLN
INTRAMUSCULAR | Status: AC
  Filled 2024-10-25: qty 1

## 2024-10-25 MED ORDER — ZINC CITRATE-PHYTASE 25-500 MG PO CAPS: 1.0000 | ORAL_CAPSULE | Freq: Every day | ORAL | Status: DC

## 2024-10-25 MED ORDER — DEXMEDETOMIDINE HCL IN NACL 80 MCG/20ML IV SOLN
INTRAVENOUS | Status: DC | PRN
  Administered 2024-10-25: 10 ug via INTRAVENOUS

## 2024-10-25 MED ORDER — ALVIMOPAN 12 MG PO CAPS
12.0000 mg | ORAL_CAPSULE | ORAL | Status: AC
  Administered 2024-10-25: 12 mg via ORAL
  Filled 2024-10-25: qty 1

## 2024-10-25 MED ORDER — SUGAMMADEX SODIUM 200 MG/2ML IV SOLN
INTRAVENOUS | Status: DC | PRN
  Administered 2024-10-25: 400 mg via INTRAVENOUS

## 2024-10-25 MED ORDER — HYDROMORPHONE HCL 1 MG/ML IJ SOLN
INTRAMUSCULAR | Status: DC | PRN
  Administered 2024-10-25 (×2): 1 mg via INTRAVENOUS

## 2024-10-25 SURGICAL SUPPLY — 98 items
BAG COUNTER SPONGE SURGICOUNT (BAG) IMPLANT
BAG URO CATCHER STRL LF (MISCELLANEOUS) ×3 IMPLANT
BLADE EXTENDED COATED 6.5IN (ELECTRODE) ×3 IMPLANT
BNDG GAUZE DERMACEA FLUFF 4 (GAUZE/BANDAGES/DRESSINGS) IMPLANT
CANNULA REDUCER 12-8 DVNC XI (CANNULA) ×3 IMPLANT
CATH URETL OPEN 5X70 (CATHETERS) IMPLANT
CHLORAPREP W/TINT 26 (MISCELLANEOUS) ×3 IMPLANT
CLIP APPLIE 5 13 M/L LIGAMAX5 (MISCELLANEOUS) IMPLANT
CLIP APPLIE ROT 10 11.4 M/L (STAPLE) IMPLANT
CLIP LIGATING HEM O LOK PURPLE (MISCELLANEOUS) IMPLANT
CLIP LIGATING HEMO O LOK GREEN (MISCELLANEOUS) IMPLANT
CLOTH BEACON ORANGE TIMEOUT ST (SAFETY) ×3 IMPLANT
COVER SURGICAL LIGHT HANDLE (MISCELLANEOUS) ×6 IMPLANT
COVER TIP SHEARS 8 DVNC (MISCELLANEOUS) ×3 IMPLANT
DEFOGGER SCOPE WARM SEASHARP (MISCELLANEOUS) ×6 IMPLANT
DERMABOND ADVANCED .7 DNX12 (GAUZE/BANDAGES/DRESSINGS) IMPLANT
DEVICE TROCAR PUNCTURE CLOSURE (ENDOMECHANICALS) IMPLANT
DRAIN CHANNEL 19F RND (DRAIN) ×3 IMPLANT
DRAPE ARM DVNC X/XI (DISPOSABLE) ×12 IMPLANT
DRAPE COLUMN DVNC XI (DISPOSABLE) ×3 IMPLANT
DRAPE CV SPLIT W-CLR ANES SCRN (DRAPES) ×3 IMPLANT
DRAPE PERI GROIN 82X75IN TIB (DRAPES) ×3 IMPLANT
DRAPE SURG IRRIG POUCH 19X23 (DRAPES) ×3 IMPLANT
DRIVER NDL LRG 8 DVNC XI (INSTRUMENTS) ×3 IMPLANT
DRIVER NDLE LRG 8 DVNC XI (INSTRUMENTS) ×2 IMPLANT
DRSG OPSITE POSTOP 4X10 (GAUZE/BANDAGES/DRESSINGS) IMPLANT
DRSG OPSITE POSTOP 4X6 (GAUZE/BANDAGES/DRESSINGS) IMPLANT
DRSG OPSITE POSTOP 4X8 (GAUZE/BANDAGES/DRESSINGS) IMPLANT
DRSG TEGADERM 2-3/8X2-3/4 SM (GAUZE/BANDAGES/DRESSINGS) ×15 IMPLANT
DRSG TEGADERM 4X4.75 (GAUZE/BANDAGES/DRESSINGS) ×3 IMPLANT
ELECT REM PT RETURN 15FT ADLT (MISCELLANEOUS) ×3 IMPLANT
EVACUATOR SILICONE 100CC (DRAIN) ×3 IMPLANT
GAUZE PAD ABD 8X10 STRL (GAUZE/BANDAGES/DRESSINGS) IMPLANT
GAUZE SPONGE 2X2 8PLY STRL LF (GAUZE/BANDAGES/DRESSINGS) ×3 IMPLANT
GAUZE SPONGE 4X4 12PLY STRL (GAUZE/BANDAGES/DRESSINGS) IMPLANT
GLOVE BIO SURGEON STRL SZ7.5 (GLOVE) ×9 IMPLANT
GLOVE INDICATOR 8.0 STRL GRN (GLOVE) ×9 IMPLANT
GLOVE SURG LX STRL 8.0 MICRO (GLOVE) ×3 IMPLANT
GOWN SRG XL LVL 4 BRTHBL STRL (GOWNS) ×3 IMPLANT
GOWN STRL REUS W/ TWL LRG LVL3 (GOWN DISPOSABLE) ×3 IMPLANT
GOWN STRL REUS W/ TWL XL LVL3 (GOWN DISPOSABLE) ×15 IMPLANT
GRASPER SUT TROCAR 14GX15 (MISCELLANEOUS) IMPLANT
GRASPER TIP-UP FEN DVNC XI (INSTRUMENTS) ×3 IMPLANT
GUIDEWIRE ANG ZIPWIRE 038X150 (WIRE) IMPLANT
GUIDEWIRE STR DUAL SENSOR (WIRE) IMPLANT
HOLDER FOLEY CATH W/STRAP (MISCELLANEOUS) ×3 IMPLANT
IRRIGATION SUCT STRKRFLW 2 WTP (MISCELLANEOUS) ×3 IMPLANT
KIT PROCEDURE DVNC SI (MISCELLANEOUS) ×3 IMPLANT
KIT TURNOVER KIT A (KITS) ×6 IMPLANT
MANIFOLD NEPTUNE II (INSTRUMENTS) ×3 IMPLANT
NDL INSUFFLATION 14GA 120MM (NEEDLE) ×3 IMPLANT
NDL SPNL 22GX3.5 QUINCKE BK (NEEDLE) IMPLANT
NEEDLE INSUFFLATION 14GA 120MM (NEEDLE) ×2 IMPLANT
NEEDLE SPNL 22GX3.5 QUINCKE BK (NEEDLE) ×2 IMPLANT
PACK COLON (CUSTOM PROCEDURE TRAY) ×3 IMPLANT
PACK CYSTO (CUSTOM PROCEDURE TRAY) ×3 IMPLANT
PAD POSITIONING PINK XL (MISCELLANEOUS) ×3 IMPLANT
PENCIL SMOKE EVACUATOR (MISCELLANEOUS) IMPLANT
PROTECTOR NERVE ULNAR (MISCELLANEOUS) ×6 IMPLANT
RELOAD STAPLE 60 3.5 BLU DVNC (STAPLE) IMPLANT
RELOAD STAPLE 60 4.3 GRN DVNC (STAPLE) IMPLANT
RETRACTOR WND ALEXIS 18 MED (MISCELLANEOUS) IMPLANT
SCISSORS LAP 5X35 DISP (ENDOMECHANICALS) IMPLANT
SCISSORS MNPLR CVD DVNC XI (INSTRUMENTS) ×3 IMPLANT
SEAL UNIV 5-12 XI (MISCELLANEOUS) ×12 IMPLANT
SEALER VESSEL EXT DVNC XI (MISCELLANEOUS) ×3 IMPLANT
SLEEVE ADV FIXATION 5X100MM (TROCAR) IMPLANT
SOLUTION ELECTROSURG ANTI STCK (MISCELLANEOUS) ×3 IMPLANT
SPIKE FLUID TRANSFER (MISCELLANEOUS) ×3 IMPLANT
STAPLER 60 SUREFORM DVNC (STAPLE) IMPLANT
STAPLER ECHELON POWER CIR 29 (STAPLE) IMPLANT
STAPLER ECHELON POWER CIR 31 (STAPLE) IMPLANT
STOPCOCK 4 WAY LG BORE MALE ST (IV SETS) ×6 IMPLANT
SURGILUBE 2OZ TUBE FLIPTOP (MISCELLANEOUS) ×3 IMPLANT
SUT MNCRL AB 4-0 PS2 18 (SUTURE) ×3 IMPLANT
SUT PDS AB 1 CT1 27 (SUTURE) IMPLANT
SUT PDS AB 1 TP1 96 (SUTURE) IMPLANT
SUT PROLENE 0 CT 2 (SUTURE) IMPLANT
SUT PROLENE 2 0 KS (SUTURE) ×3 IMPLANT
SUT PROLENE 2 0 SH DA (SUTURE) IMPLANT
SUT SILK 2 0 SH CR/8 (SUTURE) IMPLANT
SUT SILK 2-0 18XBRD TIE 12 (SUTURE) IMPLANT
SUT SILK 3 0 SH CR/8 (SUTURE) ×3 IMPLANT
SUT SILK 3-0 18XBRD TIE 12 (SUTURE) ×3 IMPLANT
SUT VIC AB 2-0 SH 27X BRD (SUTURE) IMPLANT
SUT VIC AB 3-0 SH 18 (SUTURE) IMPLANT
SUT VIC AB 3-0 SH 27XBRD (SUTURE) IMPLANT
SUT VICRYL 0 UR6 27IN ABS (SUTURE) ×3 IMPLANT
SUTURE V-LC BRB 180 2/0GR6GS22 (SUTURE) IMPLANT
SYR 10ML LL (SYRINGE) ×3 IMPLANT
SYSTEM LAPSCP GELPORT 120MM (MISCELLANEOUS) IMPLANT
SYSTEM WOUND ALEXIS 18CM MED (MISCELLANEOUS) ×3 IMPLANT
TAPE UMBILICAL 1/8 X36 TWILL (MISCELLANEOUS) ×3 IMPLANT
TRAY FOLEY MTR SLVR 16FR STAT (SET/KITS/TRAYS/PACK) ×3 IMPLANT
TROCAR ADV FIXATION 5X100MM (TROCAR) ×3 IMPLANT
TUBING CONNECTING 10 (TUBING) ×12 IMPLANT
TUBING INSUFFLATION 10FT LAP (TUBING) ×3 IMPLANT
TUBING UROLOGY SET (TUBING) IMPLANT

## 2024-10-25 NOTE — Transfer of Care (Signed)
 Immediate Anesthesia Transfer of Care Note  Patient: Morgan Cline  Procedure(s) Performed: CLOSURE, COLOSTOMY, ROBOT-ASSISTED (Abdomen) RESECTION AND ANASTAMOSIS, RECTUM, LOW ANTERIOR, ROBOT-ASSISTED (Abdomen) LYSIS, ADHESIONS, ROBOT-ASSISTED (Abdomen) CYSTOSCOPY WITH INDOCYANINE GREEN IMAGING (ICG)  Patient Location: PACU  Anesthesia Type:General  Level of Consciousness: drowsy and patient cooperative  Airway & Oxygen Therapy: Patient Spontanous Breathing  Post-op Assessment: Report given to RN and Post -op Vital signs reviewed and stable  Post vital signs: Reviewed and stable  Last Vitals:  Vitals Value Taken Time  BP 149/84 10/25/24 12:40  Temp    Pulse 89 10/25/24 12:41  Resp    SpO2 88 % 10/25/24 12:41  Vitals shown include unfiled device data.  Last Pain:  Vitals:   10/25/24 0713  TempSrc: Oral  PainSc: 0-No pain         Complications: No notable events documented.

## 2024-10-25 NOTE — Anesthesia Procedure Notes (Signed)
 Procedure Name: Intubation Date/Time: 10/25/2024 8:41 AM  Performed by: Nanci Riis, CRNAPre-anesthesia Checklist: Patient identified, Emergency Drugs available, Suction available, Patient being monitored and Timeout performed Patient Re-evaluated:Patient Re-evaluated prior to induction Oxygen Delivery Method: Circle system utilized Preoxygenation: Pre-oxygenation with 100% oxygen Induction Type: IV induction Ventilation: Mask ventilation without difficulty Laryngoscope Size: Miller and 3 Grade View: Grade I Tube type: Oral Tube size: 7.0 mm Number of attempts: 1 Airway Equipment and Method: Stylet Placement Confirmation: ETT inserted through vocal cords under direct vision, positive ETCO2 and breath sounds checked- equal and bilateral Secured at: 21 cm Tube secured with: Tape Dental Injury: Teeth and Oropharynx as per pre-operative assessment

## 2024-10-25 NOTE — Plan of Care (Signed)
  Problem: Bowel/Gastric: Goal: Gastrointestinal status for postoperative course will improve Outcome: Progressing   Problem: Health Behavior/Discharge Planning: Goal: Identification of community resources to assist with postoperative recovery needs will improve Outcome: Progressing

## 2024-10-25 NOTE — Anesthesia Preprocedure Evaluation (Addendum)
 Anesthesia Evaluation  Patient identified by MRN, date of birth, ID band Patient awake    Reviewed: Allergy & Precautions, NPO status , Patient's Chart, lab work & pertinent test results  Airway Mallampati: I  TM Distance: >3 FB Neck ROM: Full    Dental  (+) Teeth Intact, Dental Advisory Given   Pulmonary neg pulmonary ROS   breath sounds clear to auscultation       Cardiovascular hypertension, Pt. on medications  Rhythm:Regular Rate:Normal     Neuro/Psych  Headaches    GI/Hepatic negative GI ROS, Neg liver ROS,,,  Endo/Other    Class 3 obesity  Renal/GU negative Renal ROS     Musculoskeletal  (+) Arthritis ,    Abdominal   Peds  Hematology  (+) Blood dyscrasia, anemia   Anesthesia Other Findings   Reproductive/Obstetrics                              Anesthesia Physical Anesthesia Plan  ASA: 3  Anesthesia Plan: General   Post-op Pain Management: Tylenol  PO (pre-op)* and Toradol  IV (intra-op)*   Induction: Intravenous  PONV Risk Score and Plan: 4 or greater and Ondansetron , Dexamethasone , Midazolam  and Scopolamine patch - Pre-op  Airway Management Planned: Oral ETT  Additional Equipment: None  Intra-op Plan:   Post-operative Plan: Extubation in OR  Informed Consent: I have reviewed the patients History and Physical, chart, labs and discussed the procedure including the risks, benefits and alternatives for the proposed anesthesia with the patient or authorized representative who has indicated his/her understanding and acceptance.     Dental advisory given  Plan Discussed with: CRNA  Anesthesia Plan Comments:          Anesthesia Quick Evaluation

## 2024-10-25 NOTE — Op Note (Signed)
 Operative Note  Preoperative diagnosis:  1.  History of perforated diverticulitis, s/p Hartmann procedure in 2023  Postoperative diagnosis: 1.   History of perforated diverticulitis, s/p Hartmann procedure in 2023  Procedure(s): 1.  Cystoscopy with bilateral ureteral FireFly injections  Surgeon: Lonni Han, MD  Assistants:  None   Anesthesia:  General  Complications:  None  EBL:  <5 mL  Specimens: 1. None  Drains/Catheters: 1.  16 French Foley  Intraoperative findings:   No intravesical pathology was seen on cystoscopy  Indication:  The patient is a 53 year old female with a history of perforated diverticulitis requiring a Hartmann procedure in 2023.  She is here today for colostomy reversal with Dr. Teresa.  Urology has been consulted to performed cystoscopy with bilateral ureteral Fire Fly injection to aide in intraoperative ureteral identification. The patient has been consented for the above procedures, voices understanding and wishes to proceed.   Description of procedure: After informed consent was obtained, the patient was brought to the operating room and general endotracheal anesthesia was administered. The patient was then placed in the dorsolithotomy position and prepped and draped in usual sterile fashion. A timeout was performed. A 21 French rigid cystoscope was then inserted into the urethral meatus and advanced into the bladder under direct vision. A complete bladder survey revealed no intravesical pathology.   A 6 French open-ended catheter was then used to intubate the right ureteral orifice and a total of 7.5 mL of firefly solution diluted with 10 mL of saline was injected into the right collecting system. A similar maneuver was then carried out on the left with the same volume and concentration of firefly. The rigid cystoscope was then removed under direct vision. A 16 French Foley catheter was then inserted and placed to gravity drainage. The patient  tolerated the procedure well. Dr. Teresa then proceeded with their portion of the case  Plan:  Foley removal is at the discretion of the primary team.

## 2024-10-25 NOTE — Consult Note (Signed)
 Urology Consult   Physician requesting consult: Lonni Pizza, MD  Reason for consult: Ureteral firefly injections  History of Present Illness: Morgan Cline is a 53 y.o. female with a history of perforated diverticulitis requiring a Hartman's procedure in 2023.  She is here today for colostomy reversal with Dr. Pizza.  Urology has been consulted to perform cystoscopy with bilateral ureteral firefly injections to aid in ureteral identification intraoperatively.  She notes history of mild mixed incontinence, but does not require any medications for.  The patient denies a history of hematuria, UTIs, STDs, urolithiasis, GU malignancy/trauma/surgery.  Past Medical History:  Diagnosis Date   Anxiety    Arthritis    Headache    migraines   Hypertension    Obesity    Pre-diabetes     Past Surgical History:  Procedure Laterality Date   CESAREAN SECTION     COLECTOMY N/A 06/05/2022   Procedure: TOTAL COLECTOMY;  Surgeon: Aron Shoulders, MD;  Location: [facility];  Service: General;  Laterality: N/A;   KNEE SURGERY Left 2020   arthroplasty, TKA  at [facility] SURGERY Right 1996   SLAP  repair from [facility]    Current Hospital Medications:  Home Meds:  Current Meds  Medication Sig   acetaminophen  (TYLENOL ) 500 MG tablet Take 2 tablets (1,000 mg total) by mouth every 6 (six) hours.   ALPRAZolam  (XANAX ) 0.25 MG tablet Take 0.25 mg by mouth daily as needed for anxiety.   Ascorbic Acid  (VITAMIN C  PO) Take 250 mg by mouth daily. gummy   ASHWAGANDHA PO Take 1 tablet by mouth daily at 12 noon.   diphenhydramine -acetaminophen  (TYLENOL  PM) 25-500 MG TABS tablet Take 1 tablet by mouth at bedtime as needed (sleep).   furosemide  (LASIX ) 40 MG tablet Take 40 mg by mouth daily.   metroNIDAZOLE  (FLAGYL ) 500 MG tablet Take 500 mg by mouth See admin instructions. Getting instructions from MD's office   neomycin  (MYCIFRADIN ) 500 MG tablet Take 500 mg by mouth 2 (two) times daily.   nystatin   (MYCOSTATIN /NYSTOP ) powder Apply 1 Application topically 2 (two) times daily as needed.   ondansetron  (ZOFRAN -ODT) 4 MG disintegrating tablet Take 2 mg by mouth every 6 (six) hours as needed.   phentermine (ADIPEX-P) 37.5 MG tablet Take 37.5 mg by mouth daily before breakfast.   topiramate  (TOPAMAX ) 100 MG tablet Take 100 mg by mouth every evening.   Zinc  Citrate-Phytase (ZYTAZE) 25-500 MG CAPS Take 1 capsule by mouth daily.    Scheduled Meds:  Chlorhexidine  Gluconate Cloth  6 each Topical Once   And   Chlorhexidine  Gluconate Cloth  6 each Topical Once   phenylephrine        Continuous Infusions:  cefoTEtan  (CEFOTAN ) IV     lactated ringers  10 mL/hr at 10/25/24 0744   PRN Meds:.phenylephrine   Allergies:  Allergies  Allergen Reactions   Codeine Hives and Itching   Compazine  [Prochlorperazine ] Hives and Itching   Pineapple Dermatitis, Hives, Itching and Swelling    Lip swelling and itching  Of Lips with associated Lip Swelling  Lip swelling and itching  Of Lips with associated Lip Swelling  Of Lips with associated Lip Swelling    Lip swelling and itching    Lip swelling and itching  Of Lips with associated Lip Swelling Lip swelling and itching    Family History  Problem Relation Age of Onset   Diabetes Maternal Grandmother    Diabetes Maternal Grandfather    Diabetes Paternal Grandmother    Diabetes  Paternal Grandfather    Stomach cancer Neg Hx    Colon cancer Neg Hx    Esophageal cancer Neg Hx     Social History:  reports that she has never smoked. She has never used smokeless tobacco. She reports that she does not currently use alcohol. She reports that she does not use drugs.  ROS: A complete review of systems was performed.  All systems are negative except for pertinent findings as noted.  Physical Exam:  Vital signs in last 24 hours: Temp:  [98.3 F (36.8 C)] 98.3 F (36.8 C) (11/19 0713) Pulse Rate:  [80] 80 (11/19 0713) Resp:  [16] 16 (11/19 0713) BP:  (131)/(88) 131/88 (11/19 0713) SpO2:  [100 %] 100 % (11/19 0713) Weight:  [121.6 kg] 121.6 kg (11/19 0713) Constitutional:  Alert and oriented, No acute distress Neurologic: Grossly intact, no focal deficits Psychiatric: Normal mood and affect  Laboratory Data:  No results for input(s): WBC, HGB, HCT, PLT in the last 72 hours.  No results for input(s): NA, K, CL, GLUCOSE, BUN, CALCIUM, CREATININE in the last 72 hours.  Invalid input(s): CO3   Results for orders placed or performed during the hospital encounter of 10/25/24 (from the past 24 hours)  Pregnancy, urine POC     Status: None   Collection Time: 10/25/24  7:15 AM  Result Value Ref Range   Preg Test, Ur NEGATIVE NEGATIVE   No results found for this or any previous visit (from the past 240 hours).  Renal Function: Recent Labs    10/20/24 1132  CREATININE 0.79   Estimated Creatinine Clearance: 107 mL/min (by C-G formula based on SCr of 0.79 mg/dL).  Radiologic Imaging: No results found.  I independently reviewed the above imaging studies.  Impression/Recommendation 53 year old female with a history of perforated diverticulitis requiring Hartman's procedure who is here today for colostomy reversal with Dr. Teresa  -The risk, benefits and alternatives of cystoscopy with bilateral ureteral firefly injections was discussed with the patient.  She voices understanding and wishes to proceed.  Lonni Han, MD [facility] 10/25/2024, 8:23 AM

## 2024-10-25 NOTE — Op Note (Addendum)
 PATIENT: Morgan Cline  53 y.o. female  Patient Care Team: Morgan Chuckie FALCON, FNP as PCP - General (Nurse Practitioner)  PREOP DIAGNOSIS: COLOSTOMY STATUS  POSTOP DIAGNOSIS: COLOSTOMY STATUS  PROCEDURE:  Robotic assisted low anterior resection; additionally, takedown of end colostomy (Hartmann's reversal) Robotic lysis of adhesions, extensive, x 120 minutes Flexible sigmoidoscopy Bilateral transversus abdominus plane (TAP) blocks  SURGEON: Morgan HERO. Teresa, MD  ASSISTANT: Morgan Schultze, MD  An experienced assistant was required given the complexity of this procedure and the standard of surgical care. My assistant helped with exposure through counter tension, suctioning, ligation and retraction to better visualize the surgical field. My assistant expedited sewing during the case by following my sutures. Wherever I use the term we in the report, my assistant actively helped me with that portion of the procedure.   ANESTHESIA: General endotracheal  EBL: 50 mL Total I/O In: 1000 [I.V.:1000] Out: 150 [Urine:150]  DRAINS: None  SPECIMEN:  Rectosigmoid colon Colostomy  COUNTS: Sponge, needle and instrument counts were reported correct x2  FINDINGS: Small bowel containing adhesions across much of her left hemiabdomen related prior surgery.  No adhesions to the midline.  Adhesiolysis was necessary to facilitate reversal of the colostomy.  Remnant sigmoid colon was removed.  We attempted to pass the EEA sizers but had to go lay down to the proximalmost rectum as there was some stricturing at the proximal end of the rectum.  Therefore a low anterior resection was carried out and a coloproctostomy fashioned.  A well perfused, tension free, hemostatic, air tight 29 mm EEA coloproctostomy anastomosis fashioned 12 cm from the anal verge by flexible sigmoidoscopy.  NARRATIVE: The patient was seen in the pre-op holding area. The risks, benefits, complications, treatment options, and  expected outcomes were previously discussed with the patient. The patient agreed with the proposed plan and has signed the informed consent form. The patient was brought to the operating room by the surgical team, identified as Morgan Cline, and the procedure verified. placed supine on the operating table and SCD's were applied. General endotracheal anesthesia was induced without difficulty. She was then positioned in the lithotomy position with Allen stirrups.  Pressure points were evaluated and padded.  A foley catheter was then placed by nursing under sterile conditions. Hair on the abdomen was clipped.  She was secured to the operating table. Dr. Devere with [facility]. The abdomen was then prepped and draped in the standard sterile fashion. A time out was completed and the above information confirmed and need for preoperative antibiotics.  An OG tube was placed by anesthesia and confirmed to be to suction.  At Palmer's point, a stab incision was created and the Veress needle was introduced into the peritoneal cavity on the first attempt.  Intraperitoneal location was confirmed by the aspiration and saline drop test.  Pneumoperitoneum was established to a maximum pressure of 15 mmHg using CO2.  Following this, the abdomen was marked for planned trocar sites.  Just to the right and cephalad to the umbilicus, an 8 mm incision was created and an 8 mm blunt tipped robotic trocar was cautiously placed into the peritoneal cavity.  The laparoscope was inserted and demonstrated no evidence of trocar site nor Veress needle site complications.  The Veress needle was removed.  Bilateral transversus abdominis plane blocks were then created using a dilute mixture of Exparel  with Marcaine .  3 additional 8 mm robotic trochars were placed under direct visualization roughly in a line extending from the  right ASIS towards the left upper quadrant. The bladder was inspected and noted to be at/below the pubic symphysis.   Staying 3 fingerbreadths above the pubic symphysis, an incision was created and the 12 mm robotic trocar inserted directed cephalad into the peritoneal cavity under direct visualization.  An additional 5 mm assist port was placed in the right lateral abdomen under direct visualization.  The abdomen was surveyed and there was adhesions across the left hemiabdomen related to prior surgery and colostomy.  No adhesions across the midline.  She was positioned in Trendelenburg with the left side tilted slightly up.  Small bowel was carefully retracted out of the pelvis.  The robot was then docked and I went to the console.  We began with adhesiolysis. Adhesions consisting of small bowel were carefully taken down from the left lower quadrant.  There are omental containing lesions which were also taken down.  There was a fair amount of small bowel inferior and lateral to where the planned colon would be brought down and we did not want to have any bowel underneath this.  Therefore, he is a lysis was necessary to facilitate a tension-free anastomosis.  Small bowel containing lesions are carefully mobilized out of the left lower quadrant.  There are also small bowel containing lesions although up to the level of the splenic flexure.  We are able to maintain an plane staying well away from the small bowel serosa and tedious meticulous dissection using scissors.  All small bowel was then inspected.  There are no serosal injuries or any other evident injuries.  After freeing all the small bowel from the left hemiabdomen of which we spent approximately 120 minutes doing, we turned our attention to the pelvis.  She does have a remnant sigmoid evident draped into the right lower quadrant of her abdomen.  Prolene stitches are seen marking this.   Quality of the tissues of the stump is good although it is clear that this is sigmoid colon. The sigmoid stump was grasped and elevated anteriorly. It was clear that we would need to  mobilize this and remove the remnant sigmoid in order to have a healthy rectal staple line without any retained sigmoid colon.  The rectosigmoid stump was carefully mobilized.  The left and right ureters were identified and protected free of injury.  At the level of the sacral promontory, the mesorectal peritoneum was incised.  The plane between the presacral fascia and the fascia propria of the rectum was gained.  Hypogastric nerves were protected free of injury and swept down.  At the level where the tinea had splayed and there were loss of appendices epiploica and at the level of the sacral promontory, the healthy supple proximal rectum was identified.  The associated mesorectum was then carefully cleared using the vessel sealer.  I went below and passed EEA sizers under direct visualization.  There was an evident stricture at the level of the proximal rectum presumably the rectosigmoid junction although this is in the deep pelvis.  It was clear that we would need to go down to this level in order to facilitate a reanastomosis of the bowel.  I went back to the console.  The proximal rectum was then fully mobilized starting posteriorly first and working out laterally.  There is great perfusion of this is there is a visible pulse in the mesentery going all the way out to where we have now cleared the rectum circumferentially mesorectum.  This was done using a vessel  sealer.  Attention was then directed at the colostomy.  We were able to free all adhesions from around the colostomy sharply using scissors.  The descending colon had been fully mobilized up to the level of the splenic flexure which does appear to have been released already.  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  We are able to steer clear of the colon in doing so.  Through this, a 60 mm green load robotic stapler was  introduced.  The remnant sigmoid and proximal most aspect of the rectum is then divided using the green load robotic stapler.  This was done with a single firing.  Staple line is inspected with well-formed staples and hemostatic.  It is well-perfused as there is a pulse in the mesentery going out to this.  I then reintroduced EEA sizers carefully.  Under direct visualization, these were advanced up to the level of the staple line without any difficulty.  I then scrubbed back in.  We turned our attention to taking down the colostomy.  The colostomy was circumferentially incised.  The colon and associated mesocolon was carefully dissected free from the surrounding tissue using Metzenbaum scissors.  The colon was then inspected and there was no apparent injury to the mesentery or serosa.  A pursestring device was then applied to the colostomy.  A pursestring consisting of 2-0 Prolene was placed.  The colostomy was then excised and passed off the specimen.  EEA sizers were passed and a 29 mm EEA stapler selected.  Belt loops consisting of 3-0 silk were placed around the pursestring suture.  The anvil was placed on the pursestring tied.  A small amount of fat was cleared to facilitate an appropriate anastomosis.  There are no apparent diverticula within the planned anastomosis.  This is then placed back into the abdomen.  An Alexis wound protector with an associated cap is placed.  Pneumoperitoneum is reestablished.  Remnant rectosigmoid stump is passed off as specimen.   I then went below to pass the stapler.  My partner remained above.  EEA sizers were cautiously introduced via the anus and advanced under direct visualization.  The EEA stapler was passed and the spike deployed just anterior to the staple line.  The components were then mated.  Orientation was confirmed such that there is no twisting of the colon nor small bowel underneath the mesenteric defect. Care was taken to ensure no other structures were  incorporated within this either.  The stapler was then closed, held, and fired. This was then removed. The donuts were inspected and noted to be complete.  The colon proximal to the coloproctostomy anastomosis was then gently occluded. The pelvis was filled with sterile irrigation. Under direct visualization, I passed a flexible sigmoidoscope.  The anastomosis was under water.  With good distention of the anastomosis there was no air leak. The anastomosis pink in appearance.  This is located at 12 cm from the anal verge by flexible sigmoidoscopy.  It is hemostatic.  Additionally, looking from above, there is no tension on the colon or mesentery.  Sigmoidoscope was withdrawn.  Irrigation was evacuated from the pelvis.  The abdomen and pelvis are surveyed and noted to be completely hemostatic without any apparent injury.  Under direct visualization, all trochars are removed.  The Alexis wound protector was removed. I scrubbed back in.  The fascia at the previous colostomy site is  then closed using 2 running #1 PDS sutures in a longitudinal manner as this results in a tension-free closure.  The fascia is palpated and noted to be completely closed.  A 0 Vicryl suture was used to create a pursestring at the level of the skin.  This is cinched down to a fingerbreadth in diameter.  This is then gently packed with moist Kerlix.  All sponge, needle, and instrument counts were reported correct x2.  The skin of all other incisions was closed with 4-0 Monocryl subcuticular suture. Dermabond was placed over all incisions.  The former colostomy site was dressed with 4 x 4's and tape.   She was then taken out of lithotomy, awakened from anesthesia, extubated, and transferred to a stretcher for transport to PACU in satisfactory condition having tolerated the procedure well.

## 2024-10-25 NOTE — H&P (Signed)
 CC: Here today for surgery  HPI: Morgan Cline is an 54 y.o. female with history of HTN, obesity, diverticulitis, whom is seen in the office today as a referral by Dr. Aron for evaluation of colostomy reversal.  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 Aron.  Findings: Descending colon diverticulitis with abscesses. Most severe spot in distal descending colon. EBL 200 mL. Postoperatively developed multiple intra-abdominal fluid collections including 1 in the left paracolic gutter where the colon needs to remain. This was drained by interventional radiology. Drain was subsequently removed uneventfully.  PATH: A. COLON AND OMENTUM, DESCENDING AND SIGMOID, RESECTION:  - Segment of colon (33 cm) showing diverticulosis and diverticulitis  with perforation and associated acute serositis  - One of the margins of is involved by serositis; other margin is  unremarkable   Colonoscopy 10/06/22 -  - Nodular mucosa in the rectum and in the sigmoid colon. Biopsied. Suspect this is related to diversion effect. - Diverticulosis in the sigmoid colon. - Diverticulosis in the descending colon, in the transverse colon and in the ascending colon. - The examined portion of the ileum was normal.  Last CT A/P 06/23/22 - 1. Interval near complete resolution of fluid within the left paracolic gutter following percutaneous drain placement. 2. Additional scattered intraperitoneal fluid collections have improved, largest in the lesser sac. No new or enlarging fluid collections identified. 3. Improved aeration of the lung bases, incompletely visualized.  She denies any complaints at present. She does have a small 2 x 2 centimeter area at the central portion of her last incision that has not completely closed yet. Covering granulation tissue. No other drainage. No fever/chills/nausea/vomiting. Her colostomy is been working well. Aside from the  inconvenience of having it, she has not had any major issues with it. 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  INTERVAL HX  GGE 05/25/24 1. Hartmann's pouch consisting of the rectum and a distal segment of the sigmoid colon. Scant diverticula of the remaining sigmoid colon noted. 2. Dextroconvex lumbar scoliosis.  Returns for follow-up. She has been doing well. She has been working to lose weight although has lost approximately 10 pounds. Denies any nausea or vomiting. Denies any colostomy issues. Good appetite. No abdominal pain. No apparent bulges/hernias. Highly motivated to have colostomy reversed if at all possible for quality of life indicated reasons.  She denies any changes in health or health history since we met in the office. No new medications/allergies. She states she is ready for surgery today. Tolerated bowel prep + abx with satisfactory result. Highly motivated to proceed today.  PMH: HTN, obesity, diverticulitis  PSH: Exlap/Hartmann's 06/05/2022  FHx: Denies any known family history of colorectal, breast, endometrial or ovarian cancer  Social Hx: Denies use of tobacco/EtOH/illicit drug. Works as a CLINICAL BIOCHEMIST in [employer]   Past Medical History:  Diagnosis Date   Anxiety    Arthritis    Headache    migraines   Hypertension    Obesity    Pre-diabetes     Past Surgical History:  Procedure Laterality Date   CESAREAN SECTION     COLECTOMY N/A 06/05/2022   Procedure: TOTAL COLECTOMY;  Surgeon: Aron Shoulders, MD;  Location: [facility];  Service: General;  Laterality: N/A;   KNEE SURGERY Left 2020   arthroplasty, TKA  at [facility] SURGERY Right 1996   SLAP  repair from [facility]  Family History  Problem Relation Age of Onset   Diabetes Maternal Grandmother    Diabetes Maternal Grandfather    Diabetes Paternal Grandmother    Diabetes Paternal Grandfather    Stomach cancer Neg Hx    Colon cancer Neg Hx    Esophageal cancer Neg Hx      Social:  reports that she has never smoked. She has never used smokeless tobacco. She reports that she does not currently use alcohol. She reports that she does not use drugs.  Allergies:  Allergies  Allergen Reactions   Codeine Hives and Itching   Compazine  [Prochlorperazine ] Hives and Itching   Pineapple Dermatitis, Hives, Itching and Swelling    Lip swelling and itching  Of Lips with associated Lip Swelling  Lip swelling and itching  Of Lips with associated Lip Swelling  Of Lips with associated Lip Swelling    Lip swelling and itching    Lip swelling and itching  Of Lips with associated Lip Swelling Lip swelling and itching    Medications: I have reviewed the patient's current medications.  Results for orders placed or performed during the hospital encounter of 10/25/24 (from the past 48 hours)  Pregnancy, urine POC     Status: None   Collection Time: 10/25/24  7:15 AM  Result Value Ref Range   Preg Test, Ur NEGATIVE NEGATIVE    Comment:        THE SENSITIVITY OF THIS METHODOLOGY IS >20 mIU/mL.     No results found.   PE Blood pressure 131/88, pulse 80, temperature 98.3 F (36.8 C), temperature source Oral, resp. rate 16, height 5' 4 (1.626 m), weight 121.6 kg, last menstrual period 12/08/2007, SpO2 100%. Constitutional: NAD; conversant Eyes: Moist conjunctiva; no lid lag; anicteric Lungs: Normal respiratory effort CV: RRR GI: Abd soft, NT/ND; no palpable hepatosplenomegaly Psychiatric: Appropriate affect  Results for orders placed or performed during the hospital encounter of 10/25/24 (from the past 48 hours)  Pregnancy, urine POC     Status: None   Collection Time: 10/25/24  7:15 AM  Result Value Ref Range   Preg Test, Ur NEGATIVE NEGATIVE    Comment:        THE SENSITIVITY OF THIS METHODOLOGY IS >20 mIU/mL.     No results found.  A/P: Morgan Cline is an 53 y.o. female with hx of HTN, HLD, obesity here for evaluation of ultimately colostomy  reversal - Hx of Hartmann's for perforated diverticulitis with feculent peritonitis  Colonoscopy 10/06/22 - clear  -GGE clear 05/2024  -We spent time discussing life with a colostomy versus attempts at reversal. She is motivated to have this reversed for quality-of-life indications.   -The anatomy and physiology of the GI tract was reviewed with the patient as it pertains to her current anatomy with left sided colostomy. -We work to give her an overview today of robotic (versus open) colostomy reversal surgery, possible low anterior resection if she has significant remnant sigmoid; cystoscopy/ureteral ICG (firefly) - [facility]; flexible sigmoidoscopy  -We specifically also discussed importance of healthy diet and remaining active, working to lose weight if at all possible leading up to surgery again today  -The planned procedures, material risks (including, but not limited to, pain, bleeding, infection, scarring, need for blood transfusion, damage to surrounding structures- blood vessels/nerves/viscus/organs, damage to ureter, urine leak, leak from anastomosis, need for additional procedures, scenarios where a stoma may be necessary and where it may be permanent, scenarios where reversal may not be technically  feasible based on intraoperative findings and the procedure being subsequently aborted; worsening of pre-existing medical conditions, hernia, recurrence, pneumonia, heart attack, stroke, death) benefits and alternatives to surgery were discussed at length. Additionally, we discussed typical postoperative expectations and the recovery process.   Lonni Pizza, MD [facility]

## 2024-10-25 NOTE — Anesthesia Postprocedure Evaluation (Signed)
 Anesthesia Post Note  Patient: Morgan Cline  Procedure(s) Performed: CLOSURE, COLOSTOMY, ROBOT-ASSISTED (Abdomen) RESECTION AND ANASTAMOSIS, RECTUM, LOW ANTERIOR, ROBOT-ASSISTED (Abdomen) LYSIS, ADHESIONS, ROBOT-ASSISTED (Abdomen) CYSTOSCOPY WITH INDOCYANINE GREEN  IMAGING (ICG)     Patient location during evaluation: PACU Anesthesia Type: General Level of consciousness: awake and alert Pain management: pain level controlled Vital Signs Assessment: post-procedure vital signs reviewed and stable Respiratory status: spontaneous breathing, nonlabored ventilation, respiratory function stable and patient connected to nasal cannula oxygen Cardiovascular status: blood pressure returned to baseline and stable Postop Assessment: no apparent nausea or vomiting Anesthetic complications: no   No notable events documented.  Last Vitals:  Vitals:   10/25/24 1458 10/25/24 1613  BP: (!) 150/95 (!) 140/75  Pulse: 73 67  Resp: 18 18  Temp: 36.6 C 36.6 C  SpO2: 100% 99%    Last Pain:  Vitals:   10/25/24 1613  TempSrc: Oral  PainSc:                  Worley Radermacher D Joany Khatib

## 2024-10-26 ENCOUNTER — Other Ambulatory Visit (HOSPITAL_COMMUNITY): Payer: Self-pay

## 2024-10-26 ENCOUNTER — Encounter (HOSPITAL_COMMUNITY): Payer: Self-pay | Admitting: Surgery

## 2024-10-26 LAB — BASIC METABOLIC PANEL WITH GFR
Anion gap: 11 (ref 5–15)
BUN: 9 mg/dL (ref 6–20)
CO2: 22 mmol/L (ref 22–32)
Calcium: 9.2 mg/dL (ref 8.9–10.3)
Chloride: 105 mmol/L (ref 98–111)
Creatinine, Ser: 0.87 mg/dL (ref 0.44–1.00)
GFR, Estimated: >60 mL/min (ref >60)
Glucose, Bld: 112 mg/dL — ABNORMAL HIGH (ref 70–99)
Potassium: 4 mmol/L (ref 3.5–5.1)
Sodium: 137 mmol/L (ref 135–145)

## 2024-10-26 LAB — CBC
HCT: 39.5 % (ref 36.0–46.0)
Hemoglobin: 12.5 g/dL (ref 12.0–15.0)
MCH: 26.7 pg (ref 26.0–34.0)
MCHC: 31.6 g/dL (ref 30.0–36.0)
MCV: 84.2 fL (ref 80.0–100.0)
Platelets: 348 K/uL (ref 150–400)
RBC: 4.69 MIL/uL (ref 3.87–5.11)
RDW: 16.6 % — ABNORMAL HIGH (ref 11.5–15.5)
WBC: 14.9 K/uL — ABNORMAL HIGH (ref 4.0–10.5)
nRBC: 0 % (ref 0.0–0.2)

## 2024-10-26 LAB — SURGICAL PATHOLOGY

## 2024-10-26 MED ORDER — TRAMADOL HCL 50 MG PO TABS
50.0000 mg | ORAL_TABLET | Freq: Four times a day (QID) | ORAL | 0 refills | Status: AC | PRN
  Filled 2024-10-26: qty 15, 4d supply, fill #0

## 2024-10-26 NOTE — Discharge Instructions (Addendum)
 POST OP INSTRUCTIONS AFTER COLON SURGERY  DIET: Be sure to include lots of fluids daily to stay hydrated - 64oz of water per day (8, 8 oz glasses).  Avoid fast food or heavy meals for the first couple of weeks as your are more likely to get nauseated. Avoid raw/uncooked fruits or vegetables for the first 4 weeks (its ok to have these if they are blended into smoothie form). If you have fruits/vegetables, make sure they are cooked until soft enough to mash on the roof of your mouth and chew your food well. Otherwise, diet as tolerated.  Take your usually prescribed home medications unless otherwise directed.  PAIN CONTROL: Pain is best controlled by a usual combination of three different methods TOGETHER: Ice/Heat Over the counter pain medication Prescription pain medication Most patients will experience some swelling and bruising around the surgical site.  Ice packs or heating pads (30-60 minutes up to 6 times a day) will help. Some people prefer to use ice alone, heat alone, alternating between ice & heat.  Experiment to what works for you.  Swelling and bruising can take several weeks to resolve.   It is helpful to take an over-the-counter pain medication regularly for the first few weeks: Ibuprofen  (Motrin /Advil ) - 200mg  tabs - take 3 tabs (600mg ) every 6 hours as needed for pain (unless you have been directed previously to avoid NSAIDs/ibuprofen ) Acetaminophen  (Tylenol ) - you may take 650mg  every 6 hours as needed. You can take this with motrin  as they act differently on the body. If you are taking a narcotic pain medication that has acetaminophen  in it, do not take over the counter tylenol  at the same time. NOTE: You may take both of these medications together - most patients  find it most helpful when alternating between the two (i.e. Ibuprofen  at 6am, tylenol  at 9am, ibuprofen  at 12pm ..SABRA) A  prescription for pain medication should be given to you upon discharge.  Take your pain medication as  prescribed if your pain is not adequatly controlled with the over-the-counter pain reliefs mentioned above.  Avoid getting constipated.  Between the surgery and the pain medications, it is common to experience some constipation.  Increasing fluid intake and taking a fiber supplement (such as Metamucil, Citrucel, FiberCon, MiraLax, etc) 1-2 times a day regularly will usually help prevent this problem from occurring.  A mild laxative (prune juice, Milk of Magnesia, MiraLax, etc) should be taken according to package directions if there are no bowel movements after 48 hours.    Dressing: Your incisions are covered in Dermabond which is like sterile superglue for the skin. This will come off on it's own in a couple weeks. It is waterproof and you may bathe normally starting the day after your surgery in a shower. Avoid baths/pools/lakes/oceans until your wounds have fully healed. Your former colostomy site skin was intentionally left open - the muscle underneath is closed. The wound should be gently packed as instructed prior to discharge. Dressing should be exchanged for a new clean dressing daily.  It is normal for there to be some drainage for the first couple of weeks while this heals. It is okay to remove the dressing prior to bathing get the wound wet with soap and water, pat dry, and cover with additional gauze to protect her clothing. This will close on its own over the next 4-8 weeks  ACTIVITIES as tolerated:   Avoid heavy lifting (>10lbs or 1 gallon of milk) for the next 6 weeks. You  may resume regular daily activities as tolerated--such as daily self-care, walking, climbing stairs--gradually increasing activities as tolerated.  If you can walk 30 minutes without difficulty, it is safe to try more intense activity such as jogging, treadmill, bicycling, low-impact aerobics.  DO NOT PUSH THROUGH PAIN.  Let pain be your guide: If it hurts to do something, don't do it. You may drive when you are no longer  taking prescription pain medication, you can comfortably wear a seatbelt, and you can safely maneuver your car and apply brakes.  FOLLOW UP in our office Please call [facility] at [phone] to set up an appointment to see your surgeon in the office for a follow-up appointment approximately 2 weeks after your surgery. Make sure that you call for this appointment the day you arrive home to insure a convenient appointment time.  9. If you have disability or family leave forms that need to be completed, you may have them completed by your primary care physician's office; for return to work instructions, please ask our office staff and they will be happy to assist you in obtaining this documentation   When to call us  [phone]: Poor pain control Reactions / problems with new medications (rash/itching, etc)  Fever over 101.5 F (38.5 C) Inability to urinate Nausea/vomiting Worsening swelling or bruising Continued bleeding from incision. Increased pain, redness, or drainage from the incision  The clinic staff is available to answer your questions during regular business hours (8:30am-5pm).  Please don't hesitate to call and ask to speak to one of our nurses for clinical concerns.   A surgeon from [facility] is always on call at the hospitals   If you have a medical emergency, go to the nearest emergency room or call 911.  [facility] [address], [place], [place]  [address] MAIN: [phone] FAX: [phone] [email or web address]

## 2024-10-26 NOTE — Progress Notes (Signed)
  Subjective No acute events. Feeling well. Passing flatus. No BM yet. Tolerating liquids without any issues.  Objective: Vital signs in last 24 hours: Temp:  [97.3 F (36.3 C)-99 F (37.2 C)] 98.5 F (36.9 C) (11/20 0610) Pulse Rate:  [67-89] 68 (11/20 0610) Resp:  [10-18] 18 (11/20 0610) BP: (108-164)/(70-108) 112/79 (11/20 0610) SpO2:  [97 %-100 %] 98 % (11/20 0610) Weight:  [121.5 kg] 121.5 kg (11/20 0706) Last BM Date : 10/25/24  Intake/Output from previous day: 11/19 0701 - 11/20 0700 In: 3136.3 [P.O.:210; I.V.:2926.3] Out: 2000 [Urine:1900; Blood:100] Intake/Output this shift: No intake/output data recorded.  Gen: NAD, comfortable CV: RRR Pulm: Normal work of breathing Abd: Soft, NT/ND; dressings in place - former colostomy site, clean/dry Ext: SCDs in place  Lab Results: CBC  Recent Labs    10/26/24 0521  WBC 14.9*  HGB 12.5  HCT 39.5  PLT 348   BMET Recent Labs    10/26/24 0521  NA 137  K 4.0  CL 105  CO2 22  GLUCOSE 112*  BUN 9  CREATININE 0.87  CALCIUM 9.2   PT/INR No results for input(s): LABPROT, INR in the last 72 hours. ABG No results for input(s): PHART, HCO3 in the last 72 hours.  Invalid input(s): PCO2, PO2  Studies/Results:  Anti-infectives: Anti-infectives (From admission, onward)    Start     Dose/Rate Route Frequency Ordered Stop   10/25/24 1400  neomycin  (MYCIFRADIN ) tablet 1,000 mg  Status:  Discontinued       Placed in And Linked Group   1,000 mg Oral 3 times per day 10/25/24 9367 10/25/24 0646   10/25/24 1400  metroNIDAZOLE  (FLAGYL ) tablet 1,000 mg  Status:  Discontinued       Placed in And Linked Group   1,000 mg Oral 3 times per day 10/25/24 9367 10/25/24 0646   10/25/24 0645  cefoTEtan  (CEFOTAN ) 2 g in sodium chloride  0.9 % 100 mL IVPB        2 g 200 mL/hr over 30 Minutes Intravenous On call to O.R. 10/25/24 0632 10/25/24 0847        Assessment/Plan: Patient Active Problem List   Diagnosis  Date Noted   S/P colostomy takedown 10/25/2024   Obesity due to excess calories without serious comorbidity 08/16/2024   Complication of colostomy (HCC) 08/16/2024   Irritant contact dermatitis associated with fecal stoma 02/09/2023   Colostomy complication (HCC) 02/09/2023   Normocytic anemia 06/06/2022   Bandemia 06/05/2022   Hypokalemia 06/03/2022   Hyponatremia 06/02/2022   Perforation of sigmoid colon due to diverticulitis 06/01/2022   Chronic migraine without aura without status migrainosus, not intractable 06/15/2019   Prediabetes 06/14/2019   Primary osteoarthritis of left knee 08/07/2018   IUD check up 08/11/2016   Obesity, Class III, BMI 40-49.9 (morbid obesity) (HCC) 06/30/2013   Vitamin D deficiency 06/30/2013   Disorder of menstrual bleeding 06/30/2013   s/p Procedure(s): CLOSURE, COLOSTOMY, ROBOT-ASSISTED RESECTION AND ANASTAMOSIS, RECTUM, LOW ANTERIOR, ROBOT-ASSISTED LYSIS, ADHESIONS, ROBOT-ASSISTED CYSTOSCOPY WITH INDOCYANINE GREEN  IMAGING (ICG) 10/25/2024  - Doing great - Soft diet - D/C IVF, D/C foley - Ambulate 5x/day - PPX: SQH, SCDs - We spent time reviewing her procedure, findings and plans. All questions answered. She expressed understanding   LOS: 1 day   Lonni Pizza, MD [facility], [facility]

## 2024-10-26 NOTE — Progress Notes (Signed)
   10/26/24 1033  TOC Brief Assessment  Insurance and Status Reviewed  Patient has primary care physician Yes  Home environment has been reviewed home alone  Prior level of function: independent  Prior/Current Home Services No current home services  Social Drivers of Health Review SDOH reviewed no interventions necessary  Readmission risk has been reviewed Yes  Transition of care needs no transition of care needs at this time

## 2024-10-26 NOTE — Progress Notes (Signed)
 Mobility Specialist Progress Note:   10/26/24 1630  Mobility  Activity Ambulated with assistance  Level of Assistance Contact guard assist, steadying assist  Assistive Device Front wheel walker  Distance Ambulated (ft) 120 ft  Activity Response Tolerated well  Mobility Referral Yes  Mobility visit 1 Mobility  Mobility Specialist Start Time (ACUTE ONLY) 1514  Mobility Specialist Stop Time (ACUTE ONLY) 1524  Mobility Specialist Time Calculation (min) (ACUTE ONLY) 10 min   Pt was received in bathroom and agreed to ambulation. No complaints during session. Returned to use the restroom, tech notified.  [employer] - Mobility Specialist

## 2024-10-26 NOTE — Progress Notes (Signed)
 Mobility Specialist Progress Note:   10/26/24 9077  Mobility  Activity Ambulated with assistance  Level of Assistance Standby assist, set-up cues, supervision of patient - no hands on  Assistive Device Front wheel walker  Distance Ambulated (ft) 80 ft  Activity Response Tolerated well  Mobility Referral Yes  Mobility visit 1 Mobility  Mobility Specialist Start Time (ACUTE ONLY) 0850  Mobility Specialist Stop Time (ACUTE ONLY) 0901  Mobility Specialist Time Calculation (min) (ACUTE ONLY) 11 min   Pt was received in recliner and agreed to mobility. Pt stated pain in stomach but continued ambulation. Returned to recliner with all needs met. Call bell in reach.  [employer] - Mobility Specialist

## 2024-10-26 NOTE — Plan of Care (Signed)
  Problem: Activity: Goal: Ability to tolerate increased activity will improve Outcome: Progressing   Problem: Activity: Goal: Risk for activity intolerance will decrease Outcome: Progressing   Problem: Nutrition: Goal: Adequate nutrition will be maintained Outcome: Progressing

## 2024-10-27 LAB — BASIC METABOLIC PANEL WITH GFR
Anion gap: 9 (ref 5–15)
BUN: 12 mg/dL (ref 6–20)
CO2: 26 mmol/L (ref 22–32)
Calcium: 9 mg/dL (ref 8.9–10.3)
Chloride: 103 mmol/L (ref 98–111)
Creatinine, Ser: 0.91 mg/dL (ref 0.44–1.00)
GFR, Estimated: >60 mL/min (ref >60)
Glucose, Bld: 97 mg/dL (ref 70–99)
Potassium: 3.6 mmol/L (ref 3.5–5.1)
Sodium: 138 mmol/L (ref 135–145)

## 2024-10-27 LAB — CBC
HCT: 36.9 % (ref 36.0–46.0)
Hemoglobin: 11.5 g/dL — ABNORMAL LOW (ref 12.0–15.0)
MCH: 26.9 pg (ref 26.0–34.0)
MCHC: 31.2 g/dL (ref 30.0–36.0)
MCV: 86.4 fL (ref 80.0–100.0)
Platelets: 301 K/uL (ref 150–400)
RBC: 4.27 MIL/uL (ref 3.87–5.11)
RDW: 16.8 % — ABNORMAL HIGH (ref 11.5–15.5)
WBC: 8.2 K/uL (ref 4.0–10.5)
nRBC: 0 % (ref 0.0–0.2)

## 2024-10-27 NOTE — Progress Notes (Addendum)
  Subjective No acute events. Feeling well. Passing flatus having loose Bms now. Tolerating soft diet without n/v.  Objective: Vital signs in last 24 hours: Temp:  [98.2 F (36.8 C)-98.4 F (36.9 C)] 98.2 F (36.8 C) (11/21 0545) Pulse Rate:  [67-92] 67 (11/21 0545) Resp:  [16] 16 (11/21 0545) BP: (115-122)/(74-81) 115/74 (11/21 0545) SpO2:  [95 %-98 %] 97 % (11/21 0545) Weight:  [119.3 kg] 119.3 kg (11/21 0500) Last BM Date : 10/27/24  Intake/Output from previous day: 11/20 0701 - 11/21 0700 In: 1210 [P.O.:1210] Out: 400 [Urine:400] Intake/Output this shift: No intake/output data recorded.  Gen: NAD, comfortable CV: RRR Pulm: Normal work of breathing Abd: Soft, NT/ND; dressings changed - former colostomy site, clean/dry Ext: SCDs in place  Lab Results: CBC  Recent Labs    10/26/24 0521 10/27/24 0443  WBC 14.9* 8.2  HGB 12.5 11.5*  HCT 39.5 36.9  PLT 348 301   BMET Recent Labs    10/26/24 0521 10/27/24 0443  NA 137 138  K 4.0 3.6  CL 105 103  CO2 22 26  GLUCOSE 112* 97  BUN 9 12  CREATININE 0.87 0.91  CALCIUM 9.2 9.0   PT/INR No results for input(s): LABPROT, INR in the last 72 hours. ABG No results for input(s): PHART, HCO3 in the last 72 hours.  Invalid input(s): PCO2, PO2  Studies/Results:  Anti-infectives: Anti-infectives (From admission, onward)    Start     Dose/Rate Route Frequency Ordered Stop   10/25/24 1400  neomycin  (MYCIFRADIN ) tablet 1,000 mg  Status:  Discontinued       Placed in And Linked Group   1,000 mg Oral 3 times per day 10/25/24 9367 10/25/24 0646   10/25/24 1400  metroNIDAZOLE  (FLAGYL ) tablet 1,000 mg  Status:  Discontinued       Placed in And Linked Group   1,000 mg Oral 3 times per day 10/25/24 9367 10/25/24 0646   10/25/24 0645  cefoTEtan  (CEFOTAN ) 2 g in sodium chloride  0.9 % 100 mL IVPB        2 g 200 mL/hr over 30 Minutes Intravenous On call to O.R. 10/25/24 0632 10/25/24 0847         Assessment/Plan: Patient Active Problem List   Diagnosis Date Noted   S/P colostomy takedown 10/25/2024   Obesity due to excess calories without serious comorbidity 08/16/2024   Complication of colostomy (HCC) 08/16/2024   Irritant contact dermatitis associated with fecal stoma 02/09/2023   Colostomy complication (HCC) 02/09/2023   Normocytic anemia 06/06/2022   Bandemia 06/05/2022   Hypokalemia 06/03/2022   Hyponatremia 06/02/2022   Perforation of sigmoid colon due to diverticulitis 06/01/2022   Chronic migraine without aura without status migrainosus, not intractable 06/15/2019   Prediabetes 06/14/2019   Primary osteoarthritis of left knee 08/07/2018   IUD check up 08/11/2016   Obesity, Class III, BMI 40-49.9 (morbid obesity) (HCC) 06/30/2013   Vitamin D deficiency 06/30/2013   Disorder of menstrual bleeding 06/30/2013   s/p Procedure(s): CLOSURE, COLOSTOMY, ROBOT-ASSISTED RESECTION AND ANASTAMOSIS, RECTUM, LOW ANTERIOR, ROBOT-ASSISTED LYSIS, ADHESIONS, ROBOT-ASSISTED CYSTOSCOPY WITH INDOCYANINE GREEN  IMAGING (ICG) 10/25/2024  - Doing great - Soft diet - Ambulate 5x/day - Possible discharge tomorrow if continues to progress nicely - PPX: SQH, SCDs - We spent time reviewing her procedure, findings and plans. All questions answered. She expressed understanding   LOS: 2 days   Lonni Pizza, MD [facility], [facility]

## 2024-10-27 NOTE — Progress Notes (Signed)
 Mobility Specialist Progress Note:   10/27/24 1005  Mobility  Activity Ambulated independently  Level of Assistance Independent  Assistive Device None  Distance Ambulated (ft) 120 ft  Activity Response Tolerated well  Mobility Referral Yes  Mobility visit 1 Mobility  Mobility Specialist Start Time (ACUTE ONLY) 0910  Mobility Specialist Stop Time (ACUTE ONLY) [id number]  Mobility Specialist Time Calculation (min) (ACUTE ONLY) 13 min   Pt was received in bed and agreed to mobility. Pt stated slight pain in abdomen but continued ambulation. Returned to restroom with all needs met.  [employer] - Mobility Specialist

## 2024-10-27 NOTE — Progress Notes (Signed)
 Mobility Specialist Progress Note:   10/27/24 1441  Mobility  Activity Ambulated independently  Level of Assistance Modified independent, requires aide device or extra time  Assistive Device None  Distance Ambulated (ft) 190 ft  Activity Response Tolerated well  Mobility Referral Yes  Mobility visit 1 Mobility  Mobility Specialist Start Time (ACUTE ONLY) 1405  Mobility Specialist Stop Time (ACUTE ONLY) 1415  Mobility Specialist Time Calculation (min) (ACUTE ONLY) 10 min   Pt was received in recliner and agreed to mobility. 1x small standing break in the middle of ambulation due to pain in abdomen. Returned to restroom with all needs met.  [employer] - Mobility Specialist

## 2024-10-28 ENCOUNTER — Other Ambulatory Visit (HOSPITAL_COMMUNITY): Payer: Self-pay

## 2024-10-28 ENCOUNTER — Other Ambulatory Visit: Payer: Self-pay

## 2024-10-28 LAB — CBC
HCT: 40.1 % (ref 36.0–46.0)
Hemoglobin: 12.4 g/dL (ref 12.0–15.0)
MCH: 26.5 pg (ref 26.0–34.0)
MCHC: 30.9 g/dL (ref 30.0–36.0)
MCV: 85.7 fL (ref 80.0–100.0)
Platelets: 332 K/uL (ref 150–400)
RBC: 4.68 MIL/uL (ref 3.87–5.11)
RDW: 16.7 % — ABNORMAL HIGH (ref 11.5–15.5)
WBC: 8.6 K/uL (ref 4.0–10.5)
nRBC: 0 % (ref 0.0–0.2)

## 2024-10-28 LAB — BASIC METABOLIC PANEL WITH GFR
Anion gap: 9 (ref 5–15)
BUN: 13 mg/dL (ref 6–20)
CO2: 27 mmol/L (ref 22–32)
Calcium: 9.1 mg/dL (ref 8.9–10.3)
Chloride: 104 mmol/L (ref 98–111)
Creatinine, Ser: 0.84 mg/dL (ref 0.44–1.00)
GFR, Estimated: >60 mL/min (ref >60)
Glucose, Bld: 107 mg/dL — ABNORMAL HIGH (ref 70–99)
Potassium: 3.6 mmol/L (ref 3.5–5.1)
Sodium: 139 mmol/L (ref 135–145)

## 2024-10-28 NOTE — Progress Notes (Signed)
 3 Days Post-Op   Subjective/Chief Complaint: Tol diet, having bowel function, wants to go home   Objective: Vital signs in last 24 hours: Temp:  [98 F (36.7 C)-98.2 F (36.8 C)] 98.2 F (36.8 C) (11/22 0549) Pulse Rate:  [72-100] 72 (11/22 0549) Resp:  [16] 16 (11/22 0549) BP: (108-120)/(69-81) 108/81 (11/22 0549) SpO2:  [97 %] 97 % (11/22 0549) Weight:  [120.5 kg] 120.5 kg (11/22 0500) Last BM Date : 10/27/24  Intake/Output from previous day: 11/21 0701 - 11/22 0700 In: 1180 [P.O.:1180] Out: -  Intake/Output this shift: No intake/output data recorded.  General nad Cv regular Pulm effort normal Ab soft approp tender incisions clean stoma site dressed  Lab Results:  Recent Labs    10/27/24 0443 10/28/24 0528  WBC 8.2 8.6  HGB 11.5* 12.4  HCT 36.9 40.1  PLT 301 332   BMET Recent Labs    10/27/24 0443 10/28/24 0528  NA 138 139  K 3.6 3.6  CL 103 104  CO2 26 27  GLUCOSE 97 107*  BUN 12 13  CREATININE 0.91 0.84  CALCIUM 9.0 9.1   PT/INR No results for input(s): LABPROT, INR in the last 72 hours. ABG No results for input(s): PHART, HCO3 in the last 72 hours.  Invalid input(s): PCO2, PO2  Studies/Results: No results found.  Anti-infectives: Anti-infectives (From admission, onward)    Start     Dose/Rate Route Frequency Ordered Stop   10/25/24 1400  neomycin  (MYCIFRADIN ) tablet 1,000 mg  Status:  Discontinued       Placed in And Linked Group   1,000 mg Oral 3 times per day 10/25/24 9367 10/25/24 0646   10/25/24 1400  metroNIDAZOLE  (FLAGYL ) tablet 1,000 mg  Status:  Discontinued       Placed in And Linked Group   1,000 mg Oral 3 times per day 10/25/24 9367 10/25/24 0646   10/25/24 0645  cefoTEtan  (CEFOTAN ) 2 g in sodium chloride  0.9 % 100 mL IVPB        2 g 200 mL/hr over 30 Minutes Intravenous On call to O.R. 10/25/24 9367 10/25/24 0847       Assessment/Plan: POD 3 colostomy takedown -doing great - tol diet , has return of  bowel function -dc home today Donnice Bury 10/28/2024

## 2024-11-07 NOTE — Discharge Summary (Signed)
 Patient ID: Morgan Cline MRN: [id number] DOB/AGE: 01/12/1971 53 y.o.  Admit date: 10/25/2024 Discharge date: 10/28/2024  Discharge Diagnoses Patient Active Problem List   Diagnosis Date Noted   S/P colostomy takedown 10/25/2024   Obesity due to excess calories without serious comorbidity 08/16/2024   Complication of colostomy (HCC) 08/16/2024   Irritant contact dermatitis associated with fecal stoma 02/09/2023   Colostomy complication (HCC) 02/09/2023   Normocytic anemia 06/06/2022   Bandemia 06/05/2022   Hypokalemia 06/03/2022   Hyponatremia 06/02/2022   Perforation of sigmoid colon due to diverticulitis 06/01/2022   Chronic migraine without aura without status migrainosus, not intractable 06/15/2019   Prediabetes 06/14/2019   Primary osteoarthritis of left knee 08/07/2018   IUD check up 08/11/2016   Obesity, Class III, BMI 40-49.9 (morbid obesity) (HCC) 06/30/2013   Vitamin D deficiency 06/30/2013   Disorder of menstrual bleeding 06/30/2013     Procedures OR 10/25/24 -  Robotic assisted low anterior resection; additionally, takedown of end colostomy (Hartmann's reversal) Robotic lysis of adhesions, extensive, x 120 minutes Flexible sigmoidoscopy Bilateral transversus abdominus plane (TAP) blocks  Hospital Course: She was admitted postoperatively and did great. Diet was sequentially advanced which she tolerated and she began having return of bowel function. Expectations reviewed postop. All questions answered. She was seen by my partner and deemed stable for discharge home 10/28/24.     Allergies as of 10/28/2024       Reactions   Codeine Hives, Itching   Compazine  [prochlorperazine ] Hives, Itching   Pineapple Dermatitis, Hives, Itching, Swelling   Lip swelling and itching Of Lips with associated Lip Swelling  Lip swelling and itching Of Lips with associated Lip Swelling Of Lips with associated Lip Swelling    Lip swelling and itching    Lip swelling and  itching  Of Lips with associated Lip Swelling Lip swelling and itching        Medication List     STOP taking these medications    metroNIDAZOLE  500 MG tablet Commonly known as: FLAGYL    neomycin  500 MG tablet Commonly known as: MYCIFRADIN        TAKE these medications    acetaminophen  500 MG tablet Commonly known as: TYLENOL  Take 2 tablets (1,000 mg total) by mouth every 6 (six) hours. Notes to patient: 5 am, 11 am, 5 pm, 11 pm   ALPRAZolam  0.25 MG tablet Commonly known as: XANAX  Take 0.25 mg by mouth daily as needed for anxiety.   ASHWAGANDHA PO Take 1 tablet by mouth daily at 12 noon.   diphenhydramine -acetaminophen  25-500 MG Tabs tablet Commonly known as: TYLENOL  PM Take 1 tablet by mouth at bedtime as needed (sleep).   furosemide  40 MG tablet Commonly known as: LASIX  Take 40 mg by mouth daily.   levonorgestrel  20 MCG/24HR IUD Commonly known as: MIRENA  1 each by Intrauterine route once. Implanted September 2020   nystatin  powder Commonly known as: MYCOSTATIN /NYSTOP  Apply 1 Application topically 2 (two) times daily as needed.   ondansetron  4 MG disintegrating tablet Commonly known as: ZOFRAN -ODT Take 2 mg by mouth every 6 (six) hours as needed.   phentermine 37.5 MG tablet Commonly known as: ADIPEX-P Take 37.5 mg by mouth daily before breakfast.   topiramate  100 MG tablet Commonly known as: TOPAMAX  Take 100 mg by mouth every evening.   VITAMIN C  PO Take 250 mg by mouth daily. gummy   VITAMIN D PO Take 1 tablet by mouth daily.   Zytaze 25-500 MG Caps Generic drug: Zinc  Citrate-Phytase Take  1 capsule by mouth daily.       ASK your doctor about these medications    traMADol  50 MG tablet Commonly known as: Ultram  Take 1 tablet (50 mg total) by mouth every 6 (six) hours as needed for up to 5 days (postop pain not controlled with tylenol  and ibuprofen  first). Ask about: Should I take this medication?          Follow-up Information      Teresa Lonni HERO, MD Follow up on 11/13/2024.   Specialties: General Surgery, Colon and Rectal Surgery Why: Please arrive by 8:30 am Contact information: [address] [place] [address] [phone]                 Lonni HERO. Teresa, M.D. [facility]
